# Patient Record
Sex: Male | Born: 1960 | Race: Black or African American | Hispanic: No | Marital: Single | Smoking: Former smoker
Health system: Southern US, Community
[De-identification: ages and names within clinical notes are randomized; demographics above are authoritative.]

## PROBLEM LIST (undated history)

## (undated) DIAGNOSIS — C801 Malignant (primary) neoplasm, unspecified: Secondary | ICD-10-CM

## (undated) HISTORY — PX: ABDOMINAL SURGERY: SHX537

---

## 2012-02-11 ENCOUNTER — Encounter (HOSPITAL_COMMUNITY): Payer: Self-pay | Admitting: Emergency Medicine

## 2012-02-11 ENCOUNTER — Emergency Department (HOSPITAL_COMMUNITY): Payer: Medicaid Other

## 2012-02-11 ENCOUNTER — Inpatient Hospital Stay (HOSPITAL_COMMUNITY)
Admission: EM | Admit: 2012-02-11 | Discharge: 2012-02-13 | DRG: 638 | Disposition: A | Discharge status: Home / Self Care | Payer: Medicaid Other | Source: Ambulatory Visit | Attending: Internal Medicine | Admitting: Internal Medicine

## 2012-02-11 DIAGNOSIS — Z833 Family history of diabetes mellitus: Secondary | ICD-10-CM

## 2012-02-11 DIAGNOSIS — R109 Unspecified abdominal pain: Secondary | ICD-10-CM | POA: Diagnosis present

## 2012-02-11 DIAGNOSIS — Z85028 Personal history of other malignant neoplasm of stomach: Secondary | ICD-10-CM

## 2012-02-11 DIAGNOSIS — R079 Chest pain, unspecified: Secondary | ICD-10-CM

## 2012-02-11 DIAGNOSIS — E860 Dehydration: Secondary | ICD-10-CM

## 2012-02-11 DIAGNOSIS — R1319 Other dysphagia: Secondary | ICD-10-CM | POA: Diagnosis present

## 2012-02-11 DIAGNOSIS — Z6841 Body Mass Index (BMI) 40.0 and over, adult: Secondary | ICD-10-CM

## 2012-02-11 DIAGNOSIS — Z9221 Personal history of antineoplastic chemotherapy: Secondary | ICD-10-CM

## 2012-02-11 DIAGNOSIS — E1110 Type 2 diabetes mellitus with ketoacidosis without coma: Secondary | ICD-10-CM

## 2012-02-11 DIAGNOSIS — R4586 Emotional lability: Secondary | ICD-10-CM | POA: Diagnosis present

## 2012-02-11 DIAGNOSIS — E1010 Type 1 diabetes mellitus with ketoacidosis without coma: Secondary | ICD-10-CM

## 2012-02-11 DIAGNOSIS — E119 Type 2 diabetes mellitus without complications: Secondary | ICD-10-CM

## 2012-02-11 DIAGNOSIS — K219 Gastro-esophageal reflux disease without esophagitis: Secondary | ICD-10-CM | POA: Diagnosis present

## 2012-02-11 DIAGNOSIS — E1310 Other specified diabetes mellitus with ketoacidosis without coma: Principal | ICD-10-CM | POA: Diagnosis present

## 2012-02-11 HISTORY — DX: Malignant (primary) neoplasm, unspecified: C80.1

## 2012-02-11 LAB — POCT I-STAT 3, ART BLOOD GAS (G3+)
O2 Saturation: 92 %
Patient temperature: 98
TCO2: 18 mmol/L (ref 0–100)

## 2012-02-11 LAB — COMPREHENSIVE METABOLIC PANEL
ALT: 28 U/L (ref 0–53)
Albumin: 3.7 g/dL (ref 3.5–5.2)
BUN: 17 mg/dL (ref 6–23)
Calcium: 9.9 mg/dL (ref 8.4–10.5)
GFR calc Af Amer: 77 mL/min — ABNORMAL LOW (ref >90)
Glucose, Bld: 473 mg/dL — ABNORMAL HIGH (ref 70–99)
Sodium: 131 mEq/L — ABNORMAL LOW (ref 135–145)
Total Protein: 8.5 g/dL — ABNORMAL HIGH (ref 6.0–8.3)

## 2012-02-11 LAB — URINALYSIS, ROUTINE W REFLEX MICROSCOPIC
Glucose, UA: >1000 mg/dL — AB
Ketones, ur: >80 mg/dL — AB
Leukocytes, UA: NEGATIVE
Nitrite: NEGATIVE
Specific Gravity, Urine: 1.042 — ABNORMAL HIGH (ref 1.005–1.030)
pH: 5.5 (ref 5.0–8.0)

## 2012-02-11 LAB — CBC WITH DIFFERENTIAL/PLATELET
Basophils Absolute: 0 10*3/uL (ref 0.0–0.1)
Basophils Relative: 0 % (ref 0–1)
HCT: 47.6 % (ref 39.0–52.0)
Lymphocytes Relative: 28 % (ref 12–46)
MCHC: 32.8 g/dL (ref 30.0–36.0)
Neutro Abs: 8 10*3/uL — ABNORMAL HIGH (ref 1.7–7.7)
Neutrophils Relative %: 65 % (ref 43–77)
Platelets: 229 10*3/uL (ref 150–400)
RDW: 13.8 % (ref 11.5–15.5)
WBC: 12.3 10*3/uL — ABNORMAL HIGH (ref 4.0–10.5)

## 2012-02-11 LAB — GLUCOSE, CAPILLARY: Glucose-Capillary: 402 mg/dL — ABNORMAL HIGH (ref 70–99)

## 2012-02-11 LAB — BASIC METABOLIC PANEL
CO2: 17 mEq/L — ABNORMAL LOW (ref 19–32)
Chloride: 87 mEq/L — ABNORMAL LOW (ref 96–112)
Creatinine, Ser: 1.27 mg/dL (ref 0.50–1.35)
GFR calc Af Amer: 75 mL/min — ABNORMAL LOW (ref >90)
Potassium: 5 mEq/L (ref 3.5–5.1)
Sodium: 132 mEq/L — ABNORMAL LOW (ref 135–145)

## 2012-02-11 LAB — CARDIAC PANEL(CRET KIN+CKTOT+MB+TROPI)
Relative Index: 1 (ref 0.0–2.5)
Total CK: 590 U/L — ABNORMAL HIGH (ref 7–232)
Troponin I: <0.3 ng/mL (ref <0.30)

## 2012-02-11 LAB — URINE MICROSCOPIC-ADD ON

## 2012-02-11 MED ORDER — ONDANSETRON HCL 4 MG/2ML IJ SOLN
INTRAMUSCULAR | Status: AC
  Filled 2012-02-11: qty 2

## 2012-02-11 MED ORDER — ENOXAPARIN SODIUM 40 MG/0.4ML ~~LOC~~ SOLN
40.0000 mg | SUBCUTANEOUS | Status: DC
  Filled 2012-02-11: qty 0.4

## 2012-02-11 MED ORDER — SODIUM CHLORIDE 0.9 % IV SOLN
INTRAVENOUS | Status: DC
  Administered 2012-02-12 (×2): via INTRAVENOUS
  Filled 2012-02-11 (×3): qty 1

## 2012-02-11 MED ORDER — SODIUM CHLORIDE 0.9 % IV SOLN
INTRAVENOUS | Status: DC
  Administered 2012-02-11: via INTRAVENOUS

## 2012-02-11 MED ORDER — SODIUM CHLORIDE 0.9 % IV SOLN
INTRAVENOUS | Status: DC
  Administered 2012-02-11: 3.4 [IU]/h via INTRAVENOUS
  Filled 2012-02-11: qty 1

## 2012-02-11 MED ORDER — ONDANSETRON HCL 4 MG/2ML IJ SOLN
4.0000 mg | Freq: Once | INTRAMUSCULAR | Status: AC
  Administered 2012-02-11: 4 mg via INTRAVENOUS

## 2012-02-11 MED ORDER — POTASSIUM CHLORIDE 10 MEQ/100ML IV SOLN
10.0000 meq | INTRAVENOUS | Status: AC
  Administered 2012-02-12 (×4): 10 meq via INTRAVENOUS
  Filled 2012-02-11 (×4): qty 100

## 2012-02-11 MED ORDER — SODIUM CHLORIDE 0.9 % IV BOLUS (SEPSIS)
1000.0000 mL | Freq: Once | INTRAVENOUS | Status: AC
  Administered 2012-02-11: 1000 mL via INTRAVENOUS

## 2012-02-11 MED ORDER — DEXTROSE-NACL 5-0.45 % IV SOLN
INTRAVENOUS | Status: DC
  Administered 2012-02-12: 1000 mL via INTRAVENOUS
  Administered 2012-02-12 (×2): via INTRAVENOUS

## 2012-02-11 MED ORDER — DEXTROSE 50 % IV SOLN: 25.0000 mL | INTRAVENOUS | Status: DC | PRN

## 2012-02-11 MED ORDER — HYDROMORPHONE HCL PF 1 MG/ML IJ SOLN
1.0000 mg | Freq: Once | INTRAMUSCULAR | Status: AC
  Administered 2012-02-11: 1 mg via INTRAVENOUS
  Filled 2012-02-11: qty 1

## 2012-02-11 MED ORDER — ENOXAPARIN SODIUM 40 MG/0.4ML ~~LOC~~ SOLN
40.0000 mg | Freq: Every day | SUBCUTANEOUS | Status: DC
  Administered 2012-02-12 – 2012-02-13 (×2): 40 mg via SUBCUTANEOUS
  Filled 2012-02-11 (×2): qty 0.4

## 2012-02-11 NOTE — ED Notes (Signed)
MD at bedside. 

## 2012-02-11 NOTE — ED Notes (Signed)
MD and RT at bedside. °

## 2012-02-11 NOTE — ED Notes (Signed)
Pt became nauseated and gagging after administration of pain medication, administered zofran per protocol and comfort measures.  Fluid infusing at this time. Will start iv insulin after fluid bolus completed

## 2012-02-11 NOTE — ED Notes (Addendum)
Pt has multiple complaints ongoing x 2 weeks. Pt c/o swelling in B/L legs, increase thirst and frequent urination. Pt has taken voltren, bactrim, zegrid, without relief. Pt also reports dizziness.

## 2012-02-12 DIAGNOSIS — E1110 Type 2 diabetes mellitus with ketoacidosis without coma: Secondary | ICD-10-CM

## 2012-02-12 DIAGNOSIS — R109 Unspecified abdominal pain: Secondary | ICD-10-CM

## 2012-02-12 DIAGNOSIS — R079 Chest pain, unspecified: Secondary | ICD-10-CM

## 2012-02-12 DIAGNOSIS — E860 Dehydration: Secondary | ICD-10-CM

## 2012-02-12 LAB — GLUCOSE, CAPILLARY
Glucose-Capillary: 166 mg/dL — ABNORMAL HIGH (ref 70–99)
Glucose-Capillary: 167 mg/dL — ABNORMAL HIGH (ref 70–99)
Glucose-Capillary: 168 mg/dL — ABNORMAL HIGH (ref 70–99)
Glucose-Capillary: 176 mg/dL — ABNORMAL HIGH (ref 70–99)
Glucose-Capillary: 183 mg/dL — ABNORMAL HIGH (ref 70–99)
Glucose-Capillary: 221 mg/dL — ABNORMAL HIGH (ref 70–99)
Glucose-Capillary: 245 mg/dL — ABNORMAL HIGH (ref 70–99)
Glucose-Capillary: 318 mg/dL — ABNORMAL HIGH (ref 70–99)
Glucose-Capillary: 367 mg/dL — ABNORMAL HIGH (ref 70–99)

## 2012-02-12 LAB — CBC
HCT: 43.9 % (ref 39.0–52.0)
HCT: 44.1 % (ref 39.0–52.0)
Hemoglobin: 14.2 g/dL (ref 13.0–17.0)
MCHC: 32.2 g/dL (ref 30.0–36.0)
MCV: 86.3 fL (ref 78.0–100.0)
RDW: 13.9 % (ref 11.5–15.5)
WBC: 11.2 10*3/uL — ABNORMAL HIGH (ref 4.0–10.5)

## 2012-02-12 LAB — BASIC METABOLIC PANEL
BUN: 15 mg/dL (ref 6–23)
BUN: 13 mg/dL (ref 6–23)
CO2: 20 mEq/L (ref 19–32)
CO2: 21 mEq/L (ref 19–32)
Calcium: 9.6 mg/dL (ref 8.4–10.5)
Chloride: 99 mEq/L (ref 96–112)
Chloride: 103 mEq/L (ref 96–112)
Chloride: 101 mEq/L (ref 96–112)
Creatinine, Ser: 1.12 mg/dL (ref 0.50–1.35)
Creatinine, Ser: 1.05 mg/dL (ref 0.50–1.35)
GFR calc Af Amer: 87 mL/min — ABNORMAL LOW (ref >90)
GFR calc Af Amer: 84 mL/min — ABNORMAL LOW (ref >90)
GFR calc non Af Amer: 75 mL/min — ABNORMAL LOW (ref >90)
Glucose, Bld: 167 mg/dL — ABNORMAL HIGH (ref 70–99)
Glucose, Bld: 250 mg/dL — ABNORMAL HIGH (ref 70–99)
Glucose, Bld: 202 mg/dL — ABNORMAL HIGH (ref 70–99)
Potassium: 4.5 mEq/L (ref 3.5–5.1)
Potassium: 4.2 mEq/L (ref 3.5–5.1)
Potassium: 4.5 mEq/L (ref 3.5–5.1)
Sodium: 134 mEq/L — ABNORMAL LOW (ref 135–145)
Sodium: 137 mEq/L (ref 135–145)

## 2012-02-12 LAB — CARDIAC PANEL(CRET KIN+CKTOT+MB+TROPI)
CK, MB: 5.7 ng/mL — ABNORMAL HIGH (ref 0.3–4.0)
CK, MB: 5.4 ng/mL — ABNORMAL HIGH (ref 0.3–4.0)
CK, MB: 5 ng/mL — ABNORMAL HIGH (ref 0.3–4.0)
Relative Index: 1 (ref 0.0–2.5)
Total CK: 525 U/L — ABNORMAL HIGH (ref 7–232)
Troponin I: <0.3 ng/mL (ref <0.30)
Troponin I: <0.3 ng/mL (ref <0.30)

## 2012-02-12 LAB — RAPID URINE DRUG SCREEN, HOSP PERFORMED
Opiates: NOT DETECTED
Tetrahydrocannabinol: NOT DETECTED

## 2012-02-12 MED ORDER — INSULIN GLARGINE 100 UNIT/ML ~~LOC~~ SOLN: 15.0000 [IU] | Freq: Every day | SUBCUTANEOUS | Status: DC

## 2012-02-12 MED ORDER — CHLORHEXIDINE GLUCONATE 0.12 % MT SOLN
15.0000 mL | Freq: Four times a day (QID) | OROMUCOSAL | Status: DC
  Administered 2012-02-12 – 2012-02-13 (×5): 15 mL via OROMUCOSAL
  Filled 2012-02-12 (×6): qty 15

## 2012-02-12 MED ORDER — INSULIN GLARGINE 100 UNIT/ML ~~LOC~~ SOLN
40.0000 [IU] | Freq: Every day | SUBCUTANEOUS | Status: DC
  Administered 2012-02-12: 40 [IU] via SUBCUTANEOUS

## 2012-02-12 MED ORDER — INSULIN GLARGINE 100 UNIT/ML ~~LOC~~ SOLN
15.0000 [IU] | Freq: Once | SUBCUTANEOUS | Status: AC
  Administered 2012-02-12: 15 [IU] via SUBCUTANEOUS

## 2012-02-12 MED ORDER — FLUCONAZOLE 100MG IVPB
100.0000 mg | INTRAVENOUS | Status: DC
  Administered 2012-02-12: 100 mg via INTRAVENOUS
  Filled 2012-02-12 (×2): qty 50

## 2012-02-12 MED ORDER — ACETAMINOPHEN 325 MG PO TABS
650.0000 mg | ORAL_TABLET | Freq: Four times a day (QID) | ORAL | Status: DC | PRN
  Administered 2012-02-12 – 2012-02-13 (×4): 650 mg via ORAL
  Filled 2012-02-12 (×4): qty 2

## 2012-02-12 NOTE — H&P (Signed)
Craig [name] is an 51 y.o. male.   PCP - None Oncologist - Dr.Johnson in [place]. Chief Complaint: Increased urinary frequency. Chest pain. HPI: 51 year old male history of stomach cancer being treated by oncologist at [facility] and is on chemotherapy every month presents with complaints of any increased frequency of urination and increased thirst over the last 2 weeks. At the same time patient has been having chest pressure and abdominal pain. The pain in the chest is nonexertional present even at rest. Has no associated shortness of breath palpitations or diaphoresis. In the ER patient was found to have high blood sugars with anion gap. Patient has been started on IV insulin per DKA protocol and has been admitted for further management. Patient presently chest pain and abdominal pain free. He did throw up once in the ER after he got pain relief medication and after which his symptoms have improved. Over the last few days he has not eaten well and the last time he moved his bowels was 3-4 days ago. His cardiac enzymes and EKG does not show anything acute.   patient states that his oncologist is from [place]. He comes every month to [facility] for treating the patient at [facility]. Patient has had surgeries previously for the stomach cancer the last one was in 2001. But he gets chemotherapy since then. And he gets every month.   Past Medical History  Diagnosis Date  . Cancer abdomen    Past Surgical History  Procedure Date  . Abdominal surgery     Family History  Problem Relation Age of Onset  . Diabetes type II Mother    Social History:  reports that he has never smoked. He does not have any smokeless tobacco history on file. He reports that he does not drink alcohol or use illicit drugs.  Allergies: No Known Allergies  Medications Prior to Admission  Medication Sig Dispense Refill  . Omeprazole-Sodium Bicarbonate (ZEGERID) 20-1100 MG CAPS Take 1 capsule by mouth daily before breakfast.         Results for orders placed during the hospital encounter of 02/11/12 (from the past 48 hour(s))  CBC WITH DIFFERENTIAL     Status: Abnormal   Collection Time   02/11/12  6:01 PM      Component Value Range Comment   WBC 12.3 (*) 4.0 - 10.5 K/uL    RBC 5.50  4.22 - 5.81 MIL/uL    Hemoglobin 15.6  13.0 - 17.0 g/dL    HCT 16.1  09.6 - 04.5 %    MCV 86.5  78.0 - 100.0 fL    MCH 28.4  26.0 - 34.0 pg    MCHC 32.8  30.0 - 36.0 g/dL    RDW 40.9  81.1 - 91.4 %    Platelets 229  150 - 400 K/uL    Neutrophils Relative 65  43 - 77 %    Neutro Abs 8.0 (*) 1.7 - 7.7 K/uL    Lymphocytes Relative 28  12 - 46 %    Lymphs Abs 3.4  0.7 - 4.0 K/uL    Monocytes Relative 6  3 - 12 %    Monocytes Absolute 0.8  0.1 - 1.0 K/uL    Eosinophils Relative 0  0 - 5 %    Eosinophils Absolute 0.0  0.0 - 0.7 K/uL    Basophils Relative 0  0 - 1 %    Basophils Absolute 0.0  0.0 - 0.1 K/uL   BASIC METABOLIC PANEL  Status: Abnormal   Collection Time   02/11/12  6:01 PM      Component Value Range Comment   Sodium 132 (*) 135 - 145 mEq/L    Potassium 5.0  3.5 - 5.1 mEq/L    Chloride 87 (*) 96 - 112 mEq/L    CO2 17 (*) 19 - 32 mEq/L    Glucose, Bld 502 (*) 70 - 99 mg/dL    BUN 16  6 - 23 mg/dL    Creatinine, Ser 0.10  0.50 - 1.35 mg/dL    Calcium 27.2  8.4 - 10.5 mg/dL    GFR calc non Af Amer 65 (*) >90 mL/min    GFR calc Af Amer 75 (*) >90 mL/min   URINALYSIS, ROUTINE W REFLEX MICROSCOPIC     Status: Abnormal   Collection Time   02/11/12  8:24 PM      Component Value Range Comment   Color, Urine YELLOW  YELLOW    APPearance CLEAR  CLEAR    Specific Gravity, Urine 1.042 (*) 1.005 - 1.030    pH 5.5  5.0 - 8.0    Glucose, UA >1000 (*) NEGATIVE mg/dL    Hgb urine dipstick SMALL (*) NEGATIVE    Bilirubin Urine LARGE (*) NEGATIVE    Ketones, ur >80 (*) NEGATIVE mg/dL    Protein, ur 536 (*) NEGATIVE mg/dL    Urobilinogen, UA 1.0  0.0 - 1.0 mg/dL    Nitrite NEGATIVE  NEGATIVE    Leukocytes, UA NEGATIVE   NEGATIVE   URINE MICROSCOPIC-ADD ON     Status: Abnormal   Collection Time   02/11/12  8:24 PM      Component Value Range Comment   RBC / HPF 0-2  <3 RBC/hpf    Casts GRANULAR CAST (*) NEGATIVE   COMPREHENSIVE METABOLIC PANEL     Status: Abnormal   Collection Time   02/11/12  9:58 PM      Component Value Range Comment   Sodium 131 (*) 135 - 145 mEq/L    Potassium 4.8  3.5 - 5.1 mEq/L    Chloride 89 (*) 96 - 112 mEq/L    CO2 12 (*) 19 - 32 mEq/L    Glucose, Bld 473 (*) 70 - 99 mg/dL    BUN 17  6 - 23 mg/dL    Creatinine, Ser 6.44  0.50 - 1.35 mg/dL    Calcium 9.9  8.4 - 03.4 mg/dL    Total Protein 8.5 (*) 6.0 - 8.3 g/dL    Albumin 3.7  3.5 - 5.2 g/dL    AST 22  0 - 37 U/L    ALT 28  0 - 53 U/L    Alkaline Phosphatase 110  39 - 117 U/L    Total Bilirubin 0.4  0.3 - 1.2 mg/dL    GFR calc non Af Amer 66 (*) >90 mL/min    GFR calc Af Amer 77 (*) >90 mL/min   CARDIAC PANEL(CRET KIN+CKTOT+MB+TROPI)     Status: Abnormal   Collection Time   02/11/12  9:58 PM      Component Value Range Comment   Total CK 590 (*) 7 - 232 U/L    CK, MB 5.9 (*) 0.3 - 4.0 ng/mL    Troponin I <0.30  <0.30 ng/mL    Relative Index 1.0  0.0 - 2.5   LIPASE, BLOOD     Status: Normal   Collection Time   02/11/12  9:58 PM  Component Value Range Comment   Lipase 45  11 - 59 U/L   GLUCOSE, CAPILLARY     Status: Abnormal   Collection Time   02/11/12 10:24 PM      Component Value Range Comment   Glucose-Capillary 402 (*) 70 - 99 mg/dL   POCT I-STAT 3, BLOOD GAS (G3+)     Status: Abnormal   Collection Time   02/11/12 10:59 PM      Component Value Range Comment   pH, Arterial 7.255 (*) 7.350 - 7.450    pCO2 arterial 37.5  35.0 - 45.0 mmHg    pO2, Arterial 73.0 (*) 80.0 - 100.0 mmHg    Bicarbonate 16.7 (*) 20.0 - 24.0 mEq/L    TCO2 18  0 - 100 mmol/L    O2 Saturation 92.0      Acid-base deficit 10.0 (*) 0.0 - 2.0 mmol/L    Patient temperature 98.0 F      Collection site RADIAL, ALLEN'S TEST ACCEPTABLE       Drawn by RT      Sample type ARTERIAL     GLUCOSE, CAPILLARY     Status: Abnormal   Collection Time   02/11/12 11:18 PM      Component Value Range Comment   Glucose-Capillary 387 (*) 70 - 99 mg/dL    Comment 1 Notify RN      Dg Chest 2 View  02/11/2012  *RADIOLOGY REPORT*  Clinical Data: Leg swelling, nausea.  CHEST - 2 VIEW  Comparison: None  Findings: Heart is borderline in size.  There are low lung volumes with vascular congestion and bibasilar atelectasis.  No overt edema.  No effusions or acute bony abnormality.  IMPRESSION: Cardiomegaly, vascular congestion.  Low lung volumes with bibasilar atelectasis.  Original Report Authenticated By: Cyndie Chime, M.D.    Review of Systems  HENT: Negative.   Eyes: Negative.   Respiratory: Negative.   Cardiovascular: Positive for chest pain.  Gastrointestinal: Positive for abdominal pain.  Genitourinary: Positive for frequency.  Musculoskeletal: Negative.   Skin: Negative.   Neurological: Positive for weakness.  Endo/Heme/Allergies: Negative.   Psychiatric/Behavioral: Negative.     Blood pressure 136/84, pulse 91, temperature 97.6 F (36.4 C), temperature source Oral, resp. rate 17, height 5' 1.32" (1.558 m), weight 174.045 kg (383 lb 11.2 oz), SpO2 96.00%. Physical Exam  Constitutional: He is oriented to person, place, and time. He appears well-developed and well-nourished. No distress.  HENT:  Head: Normocephalic and atraumatic.  Right Ear: External ear normal.  Left Ear: External ear normal.  Nose: Nose normal.  Mouth/Throat: Oropharynx is clear and moist. No oropharyngeal exudate.  Eyes: Conjunctivae are normal. Pupils are equal, round, and reactive to light. Right eye exhibits no discharge. Left eye exhibits no discharge. No scleral icterus.  Neck: Normal range of motion. Neck supple.  Cardiovascular: Normal rate and regular rhythm.   Respiratory: Effort normal and breath sounds normal. No respiratory distress. He has no  wheezes. He has no rales.  GI: Soft. Bowel sounds are normal. He exhibits no distension. There is no tenderness. There is no rebound.  Musculoskeletal: Normal range of motion. He exhibits no edema and no tenderness.  Neurological: He is alert and oriented to person, place, and time.       Moves all extremities.  Skin: Skin is warm and dry. He is not diaphoretic.  Psychiatric: His behavior is normal.     Assessment/Plan #1. 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 Transition to subcutaneous insulin once anion gap corrected. #2. Chest pain - presently chest pain-free. His chest x-ray does show cardiomegaly. We will cycle cardiac markers. Get 2-D echo. #3. Abdominal pain - this has improved with pain relief medications. His abdominal exam is benign when I was examining but patient did receive pain relief medications. Given his history of stomach cancer if the pain recurs may need CT abdomen and pelvis.  #4. Morbid obesity.  CODE STATUS - full code.  Taheem Fricke N. 02/12/2012, 12:48 AM

## 2012-02-12 NOTE — Progress Notes (Signed)
INITIAL ADULT NUTRITION ASSESSMENT Date: 02/12/2012   Time: 10:42 AM Reason for Assessment: Nutrition Risk Report  INTERVENTION: 1. Education provided 2. Recommend increasing diet to Carb Mod Medium, this diet will still encourage weight loss and will more accurately refect DM diet teaching   ASSESSMENT: Male 51 y.o.  Dx: Diabetic ketoacidosis  Hx:  Past Medical History  Diagnosis Date  . Cancer abdomen   Past Surgical History  Procedure Date  . Abdominal surgery     Related Meds:     . enoxaparin  40 mg Subcutaneous Daily  .  HYDROmorphone (DILAUDID) injection  1 mg Intravenous Once  . insulin glargine  15 Units Subcutaneous QHS  . insulin glargine  15 Units Subcutaneous Once  . ondansetron (ZOFRAN) IV  4 mg Intravenous Once  . potassium chloride  10 mEq Intravenous Q1H  . sodium chloride  1,000 mL Intravenous Once  . sodium chloride  1,000 mL Intravenous Once  . DISCONTD: enoxaparin  40 mg Subcutaneous Q24H  . DISCONTD: insulin glargine  15 Units Subcutaneous QHS     Ht: 5' 1.32" (155.8 cm) Pt reports height of 5\' 11"  (180 cm)  Wt: 383 lb 11.2 oz (174.045 kg)  Ideal Wt: 78.2 kg  % Ideal Wt: 222%  Usual Wt: 415 lbs per pt report % Usual Wt: 92%  BMI= 53.7 kg (m^2) Pt is morbidly obese   Food/Nutrition Related Hx: Pt reports weight loss  Labs:  CMP     Component Value Date/Time   NA 139 02/12/2012 0900   K 4.5 02/12/2012 0900   CL 103 02/12/2012 0900   CO2 15* 02/12/2012 0900   GLUCOSE 167* 02/12/2012 0900   BUN 15 02/12/2012 0900   CREATININE 1.11 02/12/2012 0900   CALCIUM 9.7 02/12/2012 0900   PROT 8.5* 02/11/2012 2158   ALBUMIN 3.7 02/11/2012 2158   AST 22 02/11/2012 2158   ALT 28 02/11/2012 2158   ALKPHOS 110 02/11/2012 2158   BILITOT 0.4 02/11/2012 2158   GFRNONAA 76* 02/12/2012 0900   GFRAA 88* 02/12/2012 0900     Intake/Output Summary (Last 24 hours) at 02/12/12 1044 Last data filed at 02/12/12 0900  Gross per 24 hour  Intake   1240 ml  Output    550 ml    Net    690 ml      Diet Order: Carb Control - low  Supplements/Tube Feeding: none  IVF:    sodium chloride Last Rate: 150 mL/hr at 02/11/12 2330  dextrose 5 % and 0.45% NaCl Last Rate: 125 mL/hr at 02/12/12 0409  insulin (NOVOLIN-R) infusion Last Rate: 11.3 Units/hr (02/12/12 1008)  DISCONTD: insulin (NOVOLIN-R) infusion Last Rate: 3.4 Units/hr (02/11/12 2228)    Estimated Nutritional Needs:   Kcal: 2200-2400 Protein: 90-100 gm  Fluid:  2.2 - 2.4 L   Pt with new onset DM. Also has hx of stomach cancer, treatments monthly in [place].  Pt reports he was starting to try and lose weight after being encouraged by his brother. He states he started taking a pain medication and an abx (unclear reason) and that is when he started to lose weight. Reports 35 lb weight loss in 2 weeks, increased urination and difficulty eating large portions. No documented weights to compare to. Some weight loss recommended for this pt given morbid obesity, weight loss PTA may have been intentional or r/t DKA.    RD provided DM diet education:  No results found for this basename: HGBA1C    RD  provided "Carbohydrate Counting for Diabetes" handout from the Academy of [employer] and Dietetics. Discussed different food groups and their effects on blood sugar, emphasizing carbohydrate-containing foods. Provided list of carbohydrates and recommended serving sizes of common foods.  Discussed importance of controlled and consistent carbohydrate intake throughout the day. Provided examples of ways to balance meals/snacks and encouraged intake of high-fiber, whole grain complex carbohydrates.  Expect fair compliance.   NUTRITION DIAGNOSIS: -Food and nutrition related knowledge deficit (NB-1.1).  Status: resolved  RELATED TO: new onset DM   AS EVIDENCE BY: DKA at admission   MONITORING/EVALUATION(Goals): Goal: understanding of DM diet: carbohydrate counting, serving sizes, and carbohydrate containing foods.   Monitor: Education needs, PO intake, weight   EDUCATION NEEDS: -Education needs addressed    DOCUMENTATION CODES Per approved criteria  -Morbid Obesity    Clarene Duke RD, LDN Pager [phone] After Hours pager [phone]

## 2012-02-12 NOTE — Progress Notes (Addendum)
Patient ID: Craig [name], male   DOB: Sep 17, 1960, 51 y.o.   MRN: [id number] PATIENT DETAILS Name: Craig [name] Age: 51 y.o. Sex: male Date of Birth: February 20, 1961 Admit Date: 02/11/2012 [id number], MD    Interim History: Patient with history of stomach cancer, on monthly chemotherapy.  Oncologist in [place] (Dr.Johnson).  Patient unable to tell me more specific information.    Subjective: C/O 35 lb weight loss in two weeks.  Cant eat do to difficulty swallowing,  and then vomiting just before admission.  Reports difficulty swallowing as a child but can not give me specifics about dysphagia.  Objective: Weight change:   Intake/Output Summary (Last 24 hours) at 02/12/12 1304 Last data filed at 02/12/12 0900  Gross per 24 hour  Intake   1240 ml  Output    550 ml  Net    690 ml   Blood pressure 106/73, pulse 97, temperature 97.6 F (36.4 C), temperature source Oral, resp. rate 18, height 5' 1.32" (1.558 m), weight 174.045 kg (383 lb 11.2 oz), SpO2 96.00%. Filed Vitals:   02/12/12 0240 02/12/12 0300 02/12/12 0517 02/12/12 1016  BP:  123/82 123/79 106/73  Pulse: 96  91 97  Temp: 98.1 F (36.7 C)  98.1 F (36.7 C) 97.6 F (36.4 C)  TempSrc: Oral  Oral Oral  Resp:  17 17 18   Height:      Weight:      SpO2:  95% 95% 96%    Physical Exam: General: A&O, NAD Lungs: Clear to auscultation bilaterally without wheezes or crackles Cardiovascular: Regular rate and rhythm without murmur gallop or rub normal S1 and S2 Abdomen: Massive Nontender, nondistended, soft, bowel sounds positive, no rebound, no ascites, no appreciable mass Extremities: No significant cyanosis, clubbing.  Darkened skin over lower extremities, but no obvious cellulitis or abnormality.  Swelling 1+ (appears chronic) in lower ext bilaterally. Psych:  A&O, Pleasant, patient unable to give much specific history.  Seems as though he may be slightly impaired.  Very young emotionally for his age.  Basic  Metabolic Panel:  Lab 02/12/12 6295 02/12/12 0625  NA 139 137  K 4.5 4.2  CL 103 99  CO2 15* 18*  GLUCOSE 167* 197*  BUN 15 15  CREATININE 1.11 1.16  CALCIUM 9.7 9.6  MG -- --  PHOS -- --   Liver Function Tests:  Lab 02/11/12 2158  AST 22  ALT 28  ALKPHOS 110  BILITOT 0.4  PROT 8.5*  ALBUMIN 3.7    Lab 02/11/12 2158  LIPASE 45  AMYLASE --   CBC:  Lab 02/12/12 0625 02/11/12 2359 02/11/12 1801  WBC 10.6* 11.2* --  NEUTROABS -- -- 8.0*  HGB 14.2 14.2 --  HCT 44.1 43.9 --  MCV 86.3 86.1 --  PLT 206 230 --   Cardiac Enzymes:  Lab 02/12/12 0900 02/11/12 2359 02/11/12 2158  CKTOTAL 549* 556* 590*  CKMB 5.4* 5.7* 5.9*  CKMBINDEX -- -- --  TROPONINI <0.30 <0.30 <0.30   CBG:  Lab 02/12/12 1235 02/12/12 1115 02/12/12 1000 02/12/12 0854 02/12/12 0812 02/12/12 0730  GLUCAP 168* 203* 186* 176* 177* 245*    Drugs of Abuse     Component Value Date/Time   LABOPIA NONE DETECTED 02/12/2012 0452   COCAINSCRNUR NONE DETECTED 02/12/2012 0452   LABBENZ NONE DETECTED 02/12/2012 0452   AMPHETMU NONE DETECTED 02/12/2012 0452   THCU NONE DETECTED 02/12/2012 0452   LABBARB NONE DETECTED 02/12/2012 0452     Studies/Results: Scheduled Meds:   .  enoxaparin  40 mg Subcutaneous Daily  .  HYDROmorphone (DILAUDID) injection  1 mg Intravenous Once  . insulin glargine  15 Units Subcutaneous Once  . insulin glargine  40 Units Subcutaneous QHS  . ondansetron (ZOFRAN) IV  4 mg Intravenous Once  . potassium chloride  10 mEq Intravenous Q1H  . sodium chloride  1,000 mL Intravenous Once  . sodium chloride  1,000 mL Intravenous Once  . DISCONTD: enoxaparin  40 mg Subcutaneous Q24H  . DISCONTD: insulin glargine  15 Units Subcutaneous QHS  . DISCONTD: insulin glargine  15 Units Subcutaneous QHS   Continuous Infusions:   . sodium chloride 150 mL/hr at 02/11/12 2330  . dextrose 5 % and 0.45% NaCl 1,000 mL (02/12/12 1202)  . insulin (NOVOLIN-R) infusion 10.8 Units/hr (02/12/12 1238)  .  DISCONTD: insulin (NOVOLIN-R) infusion 3.4 Units/hr (02/11/12 2228)   PRN Meds:.acetaminophen, dextrose  Anti-infectives:  Anti-infectives    None      Assessment/Plan: Principal Problem:  *Diabetic ketoacidosis Active Problems:  Dehydration  Chest pain  Abdominal pain  1.  DKA.  Patient still with anion gap.  New diabetic.  Question whether his chemotherapy could have been contributing to his DM.  Currently remains on the insulin drip until his bi-carb is 20, bmets are q 4 hours.  Following DKA protocol.  Nutrition consult and diabetic coordinator consult requested.  Patient has no insurance will need to be on 70/30 at discharge.  2.  Stomach Cancer.  Need records.  Trying to obtain name of Oncologist in [place].  Need to determine the type of cancer and what chemotherapy he is on.  3.  Swallowing difficulty.  (Dysphagia?)  Does not describe odynaphagia, denies that food gets stuck, but describes difficulty swallowing and weight loss.  Have requested speech therapy evaluation to help sort this out.  Added IV diflucan empirically for likely candida.  4.  Capacity. Patient is somewhat child like. Does not seem to be able to fully care for himself.  He lives alone.  He broke down into tears at the idea of taking insulin at home.  I question if he will need home health services (at a minimum) or perhaps some type of ALF placement to assist him in caring for himself and taking his medications.  DVT Prophylaxis   LOS: 1 day   Stephani Police 02/12/2012, 1:04 PM [phone]

## 2012-02-12 NOTE — Progress Notes (Signed)
  Echocardiogram 2D Echocardiogram has been performed.  Georgian Co 02/12/2012, 2:24 PM

## 2012-02-12 NOTE — Progress Notes (Signed)
Inpatient Diabetes Program Recommendations  AACE/ADA: New Consensus Statement on Inpatient Glycemic Control (2009)  Target Ranges:  Prepandial:   less than 140 mg/dL      Peak postprandial:   less than 180 mg/dL (1-2 hours)      Critically ill patients:  140 - 180 mg/dL   Reason for Visit:  New onset diabetes.  Consult   Inpatient Diabetes Program Recommendations Insulin - IV drip/GlucoStabilizer: Currently on GlucoStabilizer.  Current drip rate high at 17.6 units/hr.  Last lab draw at 2:30 pm indicated a GAP of 16.  CO2 was 20.  Was not covered for CHO intake at breakfast or lunch, which probably contributed to high drip rate. Insulin - Basal: Has Lantus 40 units ordered for HS.  Not sure patient is ready for transition off drip given elevated GAP. HgbA1C: Request MD order for Hgb A1C Outpatient Referral: To be followed at [facility].  Also has appt with Dr. Everardo All.  Financial Counselor to see.  Question of he could get assistance/coverage for OP education at the [facility].  Diet: Has had swallowing evaluation with dysphagia modifications added to diet order.  Note: Patient difficult to assess.  States that I probably needed to talk to his friend, but she had gone downstairs to a class.  Says his friend, Janie Morning, is trying to work on getting help for him.  Currently has three visitors in room.  Patient states that he has been feeling poorly for the last 2 weeks.  Mentioned taking some type of medicine, but unable to explain type or name.  States that he has been dealing with stomach cancer for the last 9 years.  Confirmed that he goes to [facility] treatment.    States he has been watching the diabetes videos on [employer].  (Later nurse told me that she doubts he has watched many of them.)  Told me that he is learning how to give insulin.  (Nurse told me that he told her that he didn't think he could do it.)  Plan to return to  see patient tomorrow morning and hope to catch his friend in the room.  States that the Case Manager has been making appts for him for after discharge.  Note that patient has received some Lantus this morning and has more ordered at HS.  See above regarding anion gap.  Patient has no insurance.  May benefit from either Novolin/ReliOn NPH (if type 2) or Novolin/ReliOn 70/30 (if either type 1 or type 2) from [employer] at $24.88/vial instead of a Lantus and Novolog regimen.  Once patient ready to transition off drip, if patient to use 70/30, at 0.4 u/kg, his dosage could be approximately 35 units bid at breakfast and supper with upward titration as indicated.  Will follow-up tomorrow.  Fayola Meckes S. Elsie Lincoln, RN, CNS, CDE  [phone])

## 2012-02-12 NOTE — ED Provider Notes (Signed)
History     CSN: [id number]  Arrival date & time 02/11/12  1733   First MD Initiated Contact with Patient 02/11/12 2018      Chief Complaint  Patient presents with  . Leg Swelling  . Anorexia  . Nausea  . Polydipsia  . 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 The patient complains of polydipsia and polyuria times several days. He also has nausea without vomiting. He complains of upper abdominal pain in epigastric region. There is no radiation to his back. He rates the pain as a 9/10 at this time. Denies constipation, diarrhea. Denies Cp/SOB. Denies fevers, chills. Denies history of diabetes in the past although he does have a family history of same. Reports b/l LE edema.   Past Medical History  Diagnosis Date  . Cancer abdomen    Past Surgical History  Procedure Date  . Abdominal surgery     Family History  Problem Relation Age of Onset  . Diabetes type II Mother     History  Substance Use Topics  . Smoking status: Never Smoker   . Smokeless tobacco: Not on file  . Alcohol Use: No      Review of Systems  All other systems reviewed and are negative.   except as noted HPI   Allergies  Review of patient's allergies indicates no known allergies.  Home Medications  No current outpatient prescriptions on file.  BP 136/84  Pulse 91  Temp 97.6 F (36.4 C) (Oral)  Resp 17  Ht 5' 1.32" (1.558 m)  Wt 383 lb 11.2 oz (174.045 kg)  BMI 71.74 kg/m2  SpO2 96%  Physical Exam  Nursing note and vitals reviewed. Constitutional: He is oriented to person, place, and time. He appears well-developed and well-nourished. No distress.  HENT:  Head: Atraumatic.       Mm dry  Eyes: Conjunctivae are normal. Pupils are equal, round, and reactive to light.  Neck: Neck supple.    Cardiovascular: Normal rate, regular rhythm, normal heart sounds and intact distal pulses.  Exam reveals no gallop and no friction rub.   No murmur heard. Pulmonary/Chest: Effort normal. No respiratory distress. He has no wheezes. He has no rales.  Abdominal: Soft. Bowel sounds are normal. There is tenderness. There is no rebound and no guarding.       Obese Epigastric ttp  Musculoskeletal: Normal range of motion. He exhibits no edema and no tenderness.  Neurological: He is alert and oriented to person, place, and time.  Skin: Skin is warm and dry.  Psychiatric: He has a normal mood and affect.    Date: 02/12/2012  Rate: 106  Rhythm: sinus tachycardia  QRS Axis: right  Intervals: normal  ST/T Wave abnormalities: normal  Conduction Disutrbances:none  Narrative Interpretation:   Old EKG Reviewed: none available  ED Course  Procedures (including critical care time)  Labs Reviewed  CBC WITH DIFFERENTIAL - Abnormal; Notable for the following:    WBC 12.3 (*)     Neutro Abs 8.0 (*)     All other components within normal limits  BASIC METABOLIC PANEL - Abnormal; Notable for the following:    Sodium 132 (*)     Chloride 87 (*)     CO2 17 (*)     Glucose, Bld 502 (*)     GFR calc non Af Amer 65 (*)  GFR calc Af Amer 75 (*)     All other components within normal limits  URINALYSIS, ROUTINE W REFLEX MICROSCOPIC - Abnormal; Notable for the following:    Specific Gravity, Urine 1.042 (*)     Glucose, UA >1000 (*)     Hgb urine dipstick SMALL (*)     Bilirubin Urine LARGE (*)     Ketones, ur >80 (*)     Protein, ur 100 (*)     All other components within normal limits  URINE MICROSCOPIC-ADD ON - Abnormal; Notable for the following:    Casts GRANULAR CAST (*)     All other components within normal limits  COMPREHENSIVE METABOLIC PANEL - Abnormal; Notable for the following:    Sodium 131 (*)     Chloride 89 (*)     CO2 12 (*)     Glucose, Bld 473 (*)     Total Protein 8.5  (*)     GFR calc non Af Amer 66 (*)     GFR calc Af Amer 77 (*)     All other components within normal limits  CARDIAC PANEL(CRET KIN+CKTOT+MB+TROPI) - Abnormal; Notable for the following:    Total CK 590 (*)     CK, MB 5.9 (*)     All other components within normal limits  GLUCOSE, CAPILLARY - Abnormal; Notable for the following:    Glucose-Capillary 402 (*)     All other components within normal limits  POCT I-STAT 3, BLOOD GAS (G3+) - Abnormal; Notable for the following:    pH, Arterial 7.255 (*)     pO2, Arterial 73.0 (*)     Bicarbonate 16.7 (*)     Acid-base deficit 10.0 (*)     All other components within normal limits  GLUCOSE, CAPILLARY - Abnormal; Notable for the following:    Glucose-Capillary 387 (*)     All other components within normal limits  CBC - Abnormal; Notable for the following:    WBC 11.2 (*)     All other components within normal limits  GLUCOSE, CAPILLARY - Abnormal; Notable for the following:    Glucose-Capillary 367 (*)     All other components within normal limits  LIPASE, BLOOD  CBC  CARDIAC PANEL(CRET KIN+CKTOT+MB+TROPI)  CARDIAC PANEL(CRET KIN+CKTOT+MB+TROPI)  BASIC METABOLIC PANEL  BASIC METABOLIC PANEL  BASIC METABOLIC PANEL  BASIC METABOLIC PANEL  CARDIAC PANEL(CRET KIN+CKTOT+MB+TROPI)   Dg Chest 2 View  02/11/2012  *RADIOLOGY REPORT*  Clinical Data: Leg swelling, nausea.  CHEST - 2 VIEW  Comparison: None  Findings: Heart is borderline in size.  There are low lung volumes with vascular congestion and bibasilar atelectasis.  No overt edema.  No effusions or acute bony abnormality.  IMPRESSION: Cardiomegaly, vascular congestion.  Low lung volumes with bibasilar atelectasis.  Original Report Authenticated By: Cyndie Chime, M.D.    1. Diabetic ketoacidosis   2. Dehydration     MDM  Patient presents with multiple complaints. He is found to have new-onset diabetes with diabetic ketoacidosis. Glucose stabilizer started per protocol. He does  have epigastric, chest pain. Abd ttp on exam. His cardiac enzymes are negative x1. EKG is non-diagnostic. Troponin negative. Doubt PE. Pain control. Resident discussed admission with [facility] hospitalist.        Forbes Cellar, MD 02/12/12 [phone]

## 2012-02-12 NOTE — Plan of Care (Signed)
Problem: Phase I Progression Outcomes Goal: NPO or per MD order Outcome: Completed/Met Date Met:  02/12/12 Carb. mod Goal: Initial discharge plan identified Outcome: Completed/Met Date Met:  02/12/12 To return home

## 2012-02-12 NOTE — Progress Notes (Signed)
-  Continue IV insulin until bicarbonate greater than 20 in and get close. We'll go ahead and start Lantus now. -Continue check B-met's every 4 hours and CBGs every 2.

## 2012-02-12 NOTE — Evaluation (Signed)
Clinical/Bedside Swallow Evaluation Patient Details  Name: Craig [name] MRN: [id number] Date of Birth: Dec 28, 1960  Today's Date: 02/12/2012 Time: 1435-1500 SLP Time Calculation (min): 25 min  Past Medical History:  Past Medical History  Diagnosis Date  . Cancer abdomen   Past Surgical History:  Past Surgical History  Procedure Date  . Abdominal surgery    HPI:  51 year old male history of stomach cancer being treated by oncologist at [facility] and is on chemotherapy every month presents with complaints of any increased frequency of urination and increased thirst over the last 2 weeks. At the same time patient has been having chest pressure and abdominal pain. The pain in the chest is nonexertional present even at rest. Has no associated shortness of breath palpitations or diaphoresis. In the ER patient was found to have high blood sugars with anion gap. Patient has been started on IV insulin per DKA protocol and has been admitted for further management. Patient presently chest pain and abdominal pain free. He did throw up once in the ER after he got pain relief medication and after which his symptoms have improved. Over the last few days he has not eaten well and the last time he moved his bowels was 3-4 days ago. Pt complains of difficulty swallowing and a terrible taste in his mouth.    Assessment / Plan / Recommendation Clinical Impression  Pt presents with a primary oral dysphagia due to dry mouth and disgust with dry solid foods with a reluctance to posteriorally propel bolus. Pt complains of a metallic taste in his mouth, espcially with water. This is often associated with diabetic ketoacidosis and with chemotherapy which the pt has each month. Will change pts diet to a dysphagia 3 mechanical soft diet with extra gravy/sauce and discussed initiation of CHG oral rinse with PA. Hopefully with medical management of DKA pt will see some improvement in symptoms. SLP will f/u x1 for  tolerance.     Aspiration Risk  None    Diet Recommendation Dysphagia 3 (Mechanical Soft);Thin liquid   Liquid Administration via: Cup;Straw Medication Administration: Whole meds with liquid Supervision: Patient able to self feed    Other  Recommendations Oral Care Recommendations: Oral care QID;Staff/trained caregiver to provide oral care   Follow Up Recommendations       Frequency and Duration min 1 x/week  1 week   Pertinent Vitals/Pain NA    SLP Swallow Goals Patient will consume recommended diet without observed clinical signs of aspiration with: Independent assistance Patient will utilize recommended strategies during swallow to increase swallowing safety with: Independent assistance   Swallow Study Prior Functional Status       General HPI: 51 year old male history of stomach cancer being treated by oncologist at [facility] and is on chemotherapy every month presents with complaints of any increased frequency of urination and increased thirst over the last 2 weeks. At the same time patient has been having chest pressure and abdominal pain. The pain in the chest is nonexertional present even at rest. Has no associated shortness of breath palpitations or diaphoresis. In the ER patient was found to have high blood sugars with anion gap. Patient has been started on IV insulin per DKA protocol and has been admitted for further management. Patient presently chest pain and abdominal pain free. He did throw up once in the ER after he got pain relief medication and after which his symptoms have improved. Over the last few days he has not eaten well and  the last time he moved his bowels was 3-4 days ago. Pt complains of difficulty swallowing and a terrible taste in his mouth.  Type of Study: Bedside swallow evaluation Diet Prior to this Study: Regular;Thin liquids Respiratory Status: Room air Behavior/Cognition: Alert;Cooperative;Pleasant mood Oral Cavity - Dentition: Adequate  natural dentition Self-Feeding Abilities: Able to feed self Patient Positioning: Upright in bed Baseline Vocal Quality: Clear Volitional Cough: Strong Volitional Swallow: Able to elicit    Oral/Motor/Sensory Function Overall Oral Motor/Sensory Function: Appears within functional limits for tasks assessed   Ice Chips     Thin Liquid Thin Liquid: Within functional limits Other Comments: pt disgusted after drinking water, bad taste, complains of stomach pains after    Nectar Thick Nectar Thick Liquid: Not tested   Honey Thick Honey Thick Liquid: Not tested   Puree Puree: Within functional limits   Solid Solid: Impaired Presentation: Self Fed Oral Phase Functional Implications:  (prolonged mastication, difficutly with a-p transit)    Solaris Kram, Riley Nearing 02/12/2012,3:19 PM

## 2012-02-12 NOTE — Care Management Note (Signed)
    Page 1 of 2   02/13/2012     3:11:40 PM   CARE MANAGEMENT NOTE 02/13/2012  Patient:  Craig [name], Craig [name]   Account Number:  [id number]  Date Initiated:  02/12/2012  Documentation initiated by:  Letha Cape  Subjective/Objective Assessment:   dx dm (new onset) hyperglycemia, hx of stomach ca - taking chemo per spouse.  admit- lives alone.     Action/Plan:   pt eval   Anticipated DC Date:  02/13/2012   Anticipated DC Plan:  HOME W HOME HEALTH SERVICES      DC Planning Services  CM consult  Medication Assistance  Follow-up appt scheduled      [facility] Choice  HOME HEALTH   Choice offered to / List presented to:  C-1 Patient        HH arranged  HH-1 RN  HH-2 PT      [facility] agency  [facility]   Status of service:  Completed, signed off Medicare Important Message given?   (If response is "NO", the following Medicare IM given date fields will be blank) Date Medicare IM given:   Date Additional Medicare IM given:    Discharge Disposition:  HOME/SELF CARE  Per UR Regulation:  Reviewed for med. necessity/level of care/duration of stay  If discussed at Long Length of Stay Meetings, dates discussed:    Comments:  PCP [facility] apt is 7/3 at 1:30  02/13/12 11:47 Letha Cape RN, BSN [phone] today patient and girlfriend state they can not afford the co-pay for MD visit.  [facility]  I called them to see if they could get this patient in , they stated to call Family Practice because they could get him in quicker because they will not be able to see patient until Sept.  I called Family Practice they faxed new patient packet over to me and I had patient 's girlfriend fill it out and this was faxed back to [facility].  Patient has an appt scheduled for 7/3 at 1:30.  I also printed out sante fe pap and novolog pap information.  Patient's girlfriend states patient will have a meter that is being donated to him  along with strips and needle container.  I also printed out [employer] transportation form, girlfriend filled out part A and MD is filling out part B, will fax over when completed. Patient is for dc today.  Patient will need medication assistance.  NCM will continue to follow for other dc needs.  Patient wants hh services and would like to work with [facility],  referral made to [facility] for [facility], HHPT would like an aide but since self pay can not have an aide.  Marie notified.  Soc will begin 24-48 hrs post discharge.  02/12/12 12:01 Letha Cape RN, BSN [phone] patient lives alone, pta independent,  patient is eligible for med ast if needed.  Patient is a new onset Diabetic. Patient has transportation at dc to get home.  I contacted financial counselor to have them speak with wife and patient about medicare disability and medicaid. Per Financial counsleor patient has an apt on Wed a t 1:30 here for disability, if patient is dc before then , then patient's wife will come back for appt.

## 2012-02-12 NOTE — Progress Notes (Signed)
Inpatient Diabetes Program Recommendations  AACE/ADA: New Consensus Statement on Inpatient Glycemic Control (2009)  Target Ranges:  Prepandial:   less than 140 mg/dL      Peak postprandial:   less than 180 mg/dL (1-2 hours)      Critically ill patients:  140 - 180 mg/dL   Note: New onset diabetes.  Request MD order a Hgb A1C.  Will continue assessment of patient later today. Trixie Maclaren S. Elsie Lincoln, RN, CNS, CDE  Pager [phone]

## 2012-02-13 DIAGNOSIS — E119 Type 2 diabetes mellitus without complications: Secondary | ICD-10-CM

## 2012-02-13 LAB — CBC
HCT: 40.8 % (ref 39.0–52.0)
MCHC: 33.6 g/dL (ref 30.0–36.0)
MCV: 84.8 fL (ref 78.0–100.0)
RDW: 13.9 % (ref 11.5–15.5)
WBC: 6.8 10*3/uL (ref 4.0–10.5)

## 2012-02-13 LAB — BASIC METABOLIC PANEL
BUN: 11 mg/dL (ref 6–23)
CO2: 20 mEq/L (ref 19–32)
Calcium: 9.4 mg/dL (ref 8.4–10.5)
GFR calc Af Amer: >90 mL/min (ref >90)
GFR calc non Af Amer: >90 mL/min (ref >90)
GFR calc non Af Amer: 85 mL/min — ABNORMAL LOW (ref >90)
Glucose, Bld: 166 mg/dL — ABNORMAL HIGH (ref 70–99)
Potassium: 4.2 mEq/L (ref 3.5–5.1)
Sodium: 138 mEq/L (ref 135–145)

## 2012-02-13 LAB — GLUCOSE, CAPILLARY
Glucose-Capillary: 164 mg/dL — ABNORMAL HIGH (ref 70–99)
Glucose-Capillary: 184 mg/dL — ABNORMAL HIGH (ref 70–99)
Glucose-Capillary: 308 mg/dL — ABNORMAL HIGH (ref 70–99)
Glucose-Capillary: 339 mg/dL — ABNORMAL HIGH (ref 70–99)
Glucose-Capillary: 348 mg/dL — ABNORMAL HIGH (ref 70–99)

## 2012-02-13 MED ORDER — FLUCONAZOLE 100 MG PO TABS: 100.0000 mg | ORAL_TABLET | Freq: Every day | ORAL | Status: AC

## 2012-02-13 MED ORDER — INSULIN ASPART 100 UNIT/ML ~~LOC~~ SOLN: 0.0000 [IU] | Freq: Three times a day (TID) | SUBCUTANEOUS | Status: DC

## 2012-02-13 MED ORDER — LIVING WELL WITH DIABETES BOOK
Freq: Once | Status: AC
  Administered 2012-02-13: 1
  Filled 2012-02-13: qty 1

## 2012-02-13 MED ORDER — BD GETTING STARTED TAKE HOME KIT: 1/2ML X 30G SYRINGES
1.0000 | Freq: Once | Status: AC
  Administered 2012-02-13: 1
  Filled 2012-02-13: qty 1

## 2012-02-13 MED ORDER — INSULIN GLARGINE 100 UNIT/ML ~~LOC~~ SOLN: 50.0000 [IU] | Freq: Every day | SUBCUTANEOUS | Status: DC

## 2012-02-13 MED ORDER — CHLORHEXIDINE GLUCONATE 0.12 % MT SOLN: 15.0000 mL | Freq: Four times a day (QID) | OROMUCOSAL | Status: AC

## 2012-02-13 MED ORDER — INSULIN ASPART 100 UNIT/ML ~~LOC~~ SOLN
0.0000 [IU] | Freq: Three times a day (TID) | SUBCUTANEOUS | Status: DC
  Administered 2012-02-13: 15 [IU] via SUBCUTANEOUS
  Administered 2012-02-13: 7 [IU] via SUBCUTANEOUS
  Administered 2012-02-13: 15 [IU] via SUBCUTANEOUS

## 2012-02-13 MED ORDER — GLUCERNA SHAKE PO LIQD: 237.0000 mL | Freq: Three times a day (TID) | ORAL | Status: DC

## 2012-02-13 NOTE — Discharge Summary (Signed)
Patient ID: Craig [name] MRN: [id number] DOB/AGE: September 23, 1960 51 y.o.  Admit date: 02/11/2012 Discharge date: 02/13/2012  Primary Care Physician:  [facility], Everlene Other, [place]  Discharge Diagnoses:   Principal Problem:  *Diabetic ketoacidosis Active Problems:  Dehydration  Chest pain  Abdominal pain  Morbid Obesity  Craig [name]  History of Stomach Cancer (no documentation in system) on Chemotherapy since 2001  Medication List  As of 02/13/2012  5:57 PM   TAKE these medications         chlorhexidine 0.12 % solution   Commonly known as: PERIDEX   Use as directed 15 mLs in the mouth or throat 4 (four) times daily.      feeding supplement Liqd   Take 237 mLs by mouth 3 (three) times daily between meals.      fluconazole 100 MG tablet   Commonly known as: DIFLUCAN   Take 1 tablet (100 mg total) by mouth daily. For presumed candida in throat      insulin aspart 100 UNIT/ML injection   Commonly known as: novoLOG   Inject 0-20 Units into the skin 3 (three) times daily with meals. CBG 70 - 120: 0 units  CBG 121 - 150: 3 units  CBG 151 - 200: 4 units  CBG 201 - 250: 7 units  CBG 251 - 300: 11 units  CBG 301 - 350: 15 units  CBG 351 - 400: 20 units      insulin glargine 100 UNIT/ML injection   Commonly known as: LANTUS   Inject 50 Units into the skin at bedtime.      Omeprazole-Sodium Bicarbonate 20-1100 MG Caps   Commonly known as: ZEGERID   Take 1 capsule by mouth daily before breakfast.            Consults:  Case management, Social work, Diabetes Coordinator  Brief H and P:  51 yo morbidly obese AA male with a reported history of stomach cancer presented to the [facility] ED complaining of nausea, difficulty eating,  increased frequency of urination and increased thirst over the last 2 weeks.  He was found to be in DKA with a cbg of 473 and a bicarb of 12.    1. DKA. Newly diagnosed diabetic. Hbg A1C not obtained.  Mr. Mathieson remained on the glucose stabilizer  over 24 hours, as it took some time for his bicarb to reach 20. He was transitioned to Lantus and Novolog (70/30 was considered, but his significant other/care taker preferred Lantus and novolog as that is what she takes).  Question whether his chemotherapy could have been contributing to his DM. Followed DKA protocol. Nutrition consult and diabetic coordinator consult were completed.  The patient was referred for out patient diabetes education as well.  Insulin dosages will need to be further adjusted by his primary care physician.   2. Stomach Cancer. Need records. Tried to obtain name of Oncologist in [place]. Knowing the type of cancer and chemotherapy he is on and the specifics of his treatment would have been helpful in providing on-going care.  The patient nor his care taker were able to provide [place] with the name of his oncologist to obtain records.  3. Swallowing difficulty. (Dysphagia?)  The patient complained of difficulty swallowing and preferred liquids to solids.  Even mashed potatoes caused difficulty.  He not describe odynaphagia, denied that food gets stuck, but rather described vague difficulty swallowing and weight loss. Speech therapy evaluation was called to help sort this out. They  felt the patient had primary oral dysphagia due to dry mouth and metallic or acidic taste.  The patient is on Zegrid at home for reflux.  He was placed on peridex oral rinse and diflucan was added empirically for esophageal candida (given his high CBGs).  At discharge the patient was still having difficulty swallowing and seemed to do well with Glucerna shakes.  Hence he was started on Glucerna shakes until his swallowing improves.  4. Emotional Lability - Patient seemed unusually dependent on his care-taker.  He frequently cried, as well as laughed inappropriately.  He was pleasant and cooperative but unable to directly answer any detailed history question.  He seemed incapable of dosing a injecting himself  with insulin.  Per his caretaker he does not have any diagnosis of mental disability.  However, he does not seem to be able to care for himself. He lives alone.  His care-taker (girlfriend) insisted that she would give him his medicines.  5.  Chest pain and abdominal pain.  At the time of admission the patient had chest pain - worse with exertion but present even at rest.  Cardiac enzymes were cycled and found to be negative.  EKG showed sinus tach with right axis deviation and bi-atrial abnormalities both at the time of admission and on 7/2.  Shortly after admission his chest pain resolved and did not return during the rest of his hospitalization.  6.  Morbid Obesity.  174 kg on 6/30.  Diagnosed with morbid obesity per nutritionist. Nutrition education provided.  Physical Exam on Discharge: General: Alert, awake, oriented x3, in no acute distress. Pleasant, sitting in recliner. Heart: Regular rate and rhythm, without murmurs, rubs, gallops. Lungs: Clear to auscultation bilaterally. Abdomen: Massive Soft, nontender, nondistended, positive bowel sounds.  Extremities: No clubbing cyanosis or edema with positive pedal pulses. Skin darkened in color in his distal extremities with out signs of infection. Neuro: Grossly intact, nonfocal. Presents as mildly learning disabled.  Filed Vitals:   02/12/12 1332 02/12/12 1916 02/13/12 0559 02/13/12 1400  BP: 98/66 112/73 108/69 145/76  Pulse: 98 113 86 93  Temp: 98.1 F (36.7 C) 98.1 F (36.7 C) 97.6 F (36.4 C) 98.3 F (36.8 C)  TempSrc: Oral Oral Oral Oral  Resp: 18 18 20 16   Height:      Weight:      SpO2: 98% 93% 97% 98%     Intake/Output Summary (Last 24 hours) at 02/13/12 1757 Last data filed at 02/13/12 0900  Gross per 24 hour  Intake    975 ml  Output    500 ml  Net    475 ml    Basic Metabolic Panel:  Lab 02/13/12 1610 02/12/12 2307  NA 138 136  K 4.3 4.2  CL 103 101  CO2 20 18*  GLUCOSE 149* 166*  BUN 10 11  CREATININE  1.02 0.98  CALCIUM 9.4 9.5  MG -- --  PHOS -- --   Liver Function Tests:  Lab 02/11/12 2158  AST 22  ALT 28  ALKPHOS 110  BILITOT 0.4  PROT 8.5*  ALBUMIN 3.7    Lab 02/11/12 2158  LIPASE 45  AMYLASE --   CBC:  Lab 02/13/12 0836 02/12/12 0625 02/11/12 1801  WBC 6.8 10.6* --  NEUTROABS -- -- 8.0*  HGB 13.7 14.2 --  HCT 40.8 44.1 --  MCV 84.8 86.3 --  PLT PLATELET CLUMPS NOTED ON SMEAR, COUNT APPEARS ADEQUATE 206 --   Cardiac Enzymes:  Lab 02/12/12 1433  02/12/12 0900 02/11/12 2359  CKTOTAL 525* 549* 556*  CKMB 5.0* 5.4* 5.7*  CKMBINDEX -- -- --  TROPONINI <0.30 <0.30 <0.30   CBG:  Lab 02/13/12 1751 02/13/12 1617 02/13/12 1158 02/13/12 0747 02/13/12 0354 02/13/12 0303  GLUCAP 339* 348* 308* 237* 136* 138*    Drugs of Abuse     Component Value Date/Time   LABOPIA NONE DETECTED 02/12/2012 0452   COCAINSCRNUR NONE DETECTED 02/12/2012 0452   LABBENZ NONE DETECTED 02/12/2012 0452   AMPHETMU NONE DETECTED 02/12/2012 0452   THCU NONE DETECTED 02/12/2012 0452   LABBARB NONE DETECTED 02/12/2012 0452    Significant Diagnostic Studies:  Dg Chest 2 View  02/11/2012  *RADIOLOGY REPORT*  Clinical Data: Leg swelling, nausea.  CHEST - 2 VIEW  Comparison: None  Findings: Heart is borderline in size.  There are low lung volumes with vascular congestion and bibasilar atelectasis.  No overt edema.  No effusions or acute bony abnormality.  IMPRESSION: Cardiomegaly, vascular congestion.  Low lung volumes with bibasilar atelectasis.  Original Report Authenticated By: Cyndie Chime, M.D.    Disposition and Follow-up: Stable for discharge to home with the assistance of his girl friend, Lanora Manis.    Discharge Orders    Future Appointments: Provider: Department: Dept Phone: Center:   02/14/2012 1:30 PM Tommie Sams, DO [facility] Res[facility] [phone] [facility]     Future Orders Please Complete By Expires   Ambulatory Referral to DSME/T      Comments:   Applying for Medication Assistance Program.   Patient seems to have barriers to learning diabetes self-care-- relying a lot on others to get assistance for him.  Probably overwhelmed with diabetes diagnosis.   Questions: Responses:   Check all special needs that apply to patient requiring 1 on 1 DSME/T Additional training Comment - New onset diabetes.    DSME/T Content Diabetes as disease process    Goal setting, problem solving    Medications    Monitoring Diabetes    Nutritional management    Physical activity    Prevent, detect and treat acute complications    Prevent, detect and treat chronic complications    Psychological adjustment   Complications/Comorbidities Obesity    Other (see comments) Comment - Cancer of the stomach.  Going for monthly treatment in [place].   Choose type of training services and number of hours requested Follow-up DSME/T:  enter hours in comments Comment - As appropriate   Diet Carb Modified      Increase activity slowly      Discharge instructions        Follow-up Information    Follow up with Everlene Other, DO on 02/14/2012. (1:30 at [facility])    Contact information:   [address] [place] [address] [phone]           Time spent on Discharge: 40 min.  SignedStephani Police 02/13/2012, 5:57 PM [phone]

## 2012-02-13 NOTE — Evaluation (Signed)
Physical Therapy Evaluation Patient Details Name: Craig [name] MRN: [id number] DOB: 09/19/1960 Today's Date: 02/13/2012 Time: 1914-7829 PT Time Calculation (min): 13 min  PT Assessment / Plan / Recommendation Clinical Impression  Pt adm with DKA.  Pt with newly diagnosed DM.  Pt with limited activity at home.  Will try a walker later to see if that improves gait endurance.    PT Assessment  Patient needs continued PT services    Follow Up Recommendations  No PT follow up;Supervision - Intermittent    Barriers to Discharge        Equipment Recommendations  Other (comment) (signicant other has rolling walker available for pt.)    Recommendations for Other Services     Frequency Min 3X/week    Precautions / Restrictions Precautions Precautions: Fall Restrictions Weight Bearing Restrictions: No   Pertinent Vitals/Pain Stomach and back pain with activity      Mobility  Bed Mobility Bed Mobility: Supine to Sit;Sit to Supine Supine to Sit: 6: Modified independent (Device/Increase time);HOB flat Sit to Supine: 6: Modified independent (Device/Increase time);HOB flat Details for Bed Mobility Assistance: Pt went into supine horizontally in bed to adjust pajama pants and then returned to sitting. Transfers Transfers: Sit to Stand;Stand to Sit Sit to Stand: 4: Min assist;With upper extremity assist;From bed Stand to Sit: 4: Min guard;To chair/3-in-1;With armrests;With upper extremity assist Details for Transfer Assistance: Pt asking for assistance up since he hadn't been up. Ambulation/Gait Ambulation/Gait Assistance: 4: Min assist Ambulation Distance (Feet): 50 Feet Assistive device: Other (Comment) (wall rail) Ambulation/Gait Assistance Details: pt became fatigued with incr stomach and back pain and needed to have recliner brought to him in hallway. Gait Pattern: Wide base of support;Decreased stride length Gait velocity: slow pace    Exercises     PT Diagnosis: Difficulty  walking  PT Problem List: Decreased activity tolerance;Decreased mobility;Decreased knowledge of use of DME PT Treatment Interventions: DME instruction;Gait training;Functional mobility training;Therapeutic activities;Therapeutic exercise;Patient/family education   PT Goals Acute Rehab PT Goals PT Goal Formulation: With patient Time For Goal Achievement: 02/20/12 Potential to Achieve Goals: Good Pt will go Sit to Stand: with modified independence PT Goal: Sit to Stand - Progress: Goal set today Pt will go Stand to Sit: with modified independence PT Goal: Stand to Sit - Progress: Goal set today Pt will Ambulate: 16 - 50 feet;with modified independence;with least restrictive assistive device PT Goal: Ambulate - Progress: Goal set today  Visit Information  Last PT Received On: 02/13/12 Assistance Needed: +1    Subjective Data  Subjective: Pt stated that he uses a rolling office chair to roll around his house at times. Patient Stated Goal: Return home.   Prior Functioning  Home Living Lives With: Significant other Available Help at Discharge: Family Type of Home: Apartment Home Access: Level entry Home Layout: One level Bathroom Shower/Tub: Tub/shower unit;Curtain Firefighter: Standard Home Adaptive Equipment: Shower chair without back;Walker - rolling (girlfriend stated she has walker at home he can use.) Additional Comments: Pt. was using wooden stool in tub for showers Prior Function Level of Independence: Independent Able to Take Stairs?: No Vocation: On disability Comments: Pt. would sometimes have significant other assist in washing his backside to help conserve energy due to it fatigues pt. to complete task Communication Communication: No difficulties    Cognition  Overall Cognitive Status: Impaired Arousal/Alertness: Awake/alert Orientation Level: Appears intact for tasks assessed Cognition - Other Comments: Pt appears emotionally labile at times.  Crying in  hallway after  needing to sit down and less than a minute later laughing with significant other.    Extremity/Trunk Assessment Right Lower Extremity Assessment RLE ROM/Strength/Tone: [facility] for tasks assessed Left Lower Extremity Assessment LLE ROM/Strength/Tone: WFL for tasks assessed   Balance Static Standing Balance Static Standing - Balance Support: Left upper extremity supported Static Standing - Level of Assistance: 5: Stand by assistance  End of Session PT - End of Session Equipment Utilized During Treatment:  (Pt too large for gait belt) Activity Tolerance: Patient limited by pain;Patient limited by fatigue Patient left: in chair;with call bell/phone within reach;with family/visitor present Nurse Communication: Mobility status  GP     [facility] 02/13/2012, 2:37 PM  [facility] PT [phone]

## 2012-02-13 NOTE — Plan of Care (Signed)
Problem: Discharge Progression Outcomes Goal: Obtain signed CBG meter Rx form Outcome: Not Applicable Date Met:  02/13/12 Girlfriend already ordered one for him

## 2012-02-13 NOTE — Clinical Social Work Psychosocial (Signed)
     Clinical Social Work Department BRIEF PSYCHOSOCIAL ASSESSMENT 02/13/2012  Patient:  Craig [name], Craig [name]     Account Number:  [id number]     Admit date:  02/11/2012  Clinical Social Worker:  Jacelyn Grip  Date/Time:  02/13/2012 09:45 AM  Referred by:  Physician  Date Referred:  02/13/2012 Referred for  Other - See comment   Other Referral:   psychosocial needs-pt electricity currently turned off   Interview type:  Patient Other interview type:    PSYCHOSOCIAL DATA Living Status:  ALONE Admitted from facility:   Level of care:   Primary support name:  Lanora Manis Cowgill/spouse Primary support relationship to patient:  SPOUSE Degree of support available:   adequate    CURRENT CONCERNS Current Concerns  Post-Acute Placement   Other Concerns:    SOCIAL WORK ASSESSMENT / PLAN CSW received notification from Physician Assistant that pt and pt caretaker requesting social worker and would like assistance with having electricity turned back on. CSW met with pt and pt caretaker/spouse, Craig [name] at bedside. Pt was on the phone at this time and pt caretaker reported pt was on the phone with electrical company inquiring about having electricity turned back on. Pt completed phone call and stated that electicity will be turned back on today. CSW discussed with pt and pt caretaker assistance programs for electrical assistance if needed in the future. Pt caretaker discussed the assistance that she has been applying for to help pt with medications and that they are applying and hopeful that pt can be accepted by the internal medicine clinic for pt PCP. RNCM following to assist with PCP and medication needs. Pt and pt caretaker did not identify any further social work needs at this time. CSW signing off at this time. Please re-consult if further needs arise   Assessment/plan status:  No Further Intervention Required Other assessment/ plan:   Information/referral to community resources:     Information in regard to Patent attorney.    PATIENTS/FAMILYS RESPONSE TO PLAN OF CARE: Pt alert and oriented. Pt caretaker is a great advocate for pt and utilizing resources to assist pt and pt is planning to apply for disability. CSW provided positive re-inforcement for pt and pt caretaker exploring resources and utilizing the appropriate resources.

## 2012-02-13 NOTE — Progress Notes (Signed)
Pt has order to D/C home with home health. Prescriptions and instructions given to patient. Meds delivered from pharmacy to patient. Diabetes education given. All videos watched, book given, patient demonstrated self injection of insulin as well as drawing up correct amount of insulin. All questions answered. Skin intact, scattered bruising. CBGs stable. Pt awaiting dinner to come prior to discharge.

## 2012-02-13 NOTE — Evaluation (Signed)
Occupational Therapy Evaluation Patient Details Name: Craig [name] MRN: [id number] DOB: [birth year] Today's Date: 02/13/2012 Time: 4098-1191 OT Time Calculation (min): 15 min  OT Assessment / Plan / Recommendation Clinical Impression  Pt. presents with DKA and history of stomach CA and mobidly obese. Pt. will benefit from skilled OT to increase functional independence and safety with ADLs to supervision level at D/C home.    OT Assessment  Patient needs continued OT Services    Follow Up Recommendations  No OT follow up;Supervision - Intermittent    Barriers to Discharge Decreased caregiver support Significant other not always there  Equipment Recommendations  Other (comment) (signicant other has rolling walker available for pt.); tub transfer bench       Frequency  Min 2X/week    Precautions / Restrictions Precautions Precautions: Fall Restrictions Weight Bearing Restrictions: No   Pertinent Vitals/Pain 5/10 in abdomen due to nausea and 10/10 intermittent back    ADL  Eating/Feeding: Performed;Independent Where Assessed - Eating/Feeding: Edge of bed Grooming: Performed;Wash/dry hands;Set up;Min guard Where Assessed - Grooming: Unsupported standing Upper Body Bathing: Simulated;Set up Where Assessed - Upper Body Bathing: Unsupported sitting Lower Body Bathing: Simulated;Minimal assistance Where Assessed - Lower Body Bathing: Unsupported sit to stand Upper Body Dressing: Performed;Set up (don gown) Where Assessed - Upper Body Dressing: Unsupported sitting Lower Body Dressing: Performed;Min guard (pulling up pants min guard for balance ) Where Assessed - Lower Body Dressing: Unsupported sit to stand Toilet Transfer: Simulated;Minimal assistance Toilet Transfer Method: Sit to stand Toilet Transfer Equipment: Other (comment) (recliner) Toileting - Clothing Manipulation and Hygiene: Simulated;Set up Where Assessed - Toileting Clothing Manipulation and Hygiene: Sit to stand  from 3-in-1 or toilet Tub/Shower Transfer Method: Not assessed Transfers/Ambulation Related to ADLs: Pt. provided with intermittent min hand held assist due to decreased balance and overall strength. Pt completed ~25' with use of railing along hallway. Due to increased abdominal pain from nausea pt. provided with recliner and sat. ADL Comments: Pt. with decreased activity tolerance and fatigues very quickly. Pt. emotionally labile during session and began joking around with therapist, then began crying in the hallway when abdominal pain started, then was laughing upon return to room in recliner.    OT Diagnosis: Generalized weakness  OT Problem List: Decreased strength;Decreased activity tolerance;Impaired balance (sitting and/or standing);Decreased safety awareness;Decreased knowledge of use of DME or AE;Pain;Obesity OT Treatment Interventions: Self-care/ADL training;Energy conservation;DME and/or AE instruction;Therapeutic activities;Patient/family education;Balance training   OT Goals Acute Rehab OT Goals OT Goal Formulation: With patient Time For Goal Achievement: 02/20/12 Potential to Achieve Goals: Good ADL Goals Pt Will Perform Grooming: with set-up;with supervision;Standing at sink ADL Goal: Grooming - Progress: Goal set today Pt Will Perform Upper Body Dressing: with set-up;Standing ADL Goal: Upper Body Dressing - Progress: Goal set today Pt Will Perform Lower Body Dressing: with set-up;with supervision;Sit to stand from chair;with adaptive equipment ADL Goal: Lower Body Dressing - Progress: Goal set today Pt Will Transfer to Toilet: with supervision;with set-up;Ambulation;Regular height toilet ADL Goal: Toilet Transfer - Progress: Goal set today Additional ADL Goal #1: pt. will recall 3 energy conservation techniques with ADLs ADL Goal: Additional Goal #1 - Progress: Goal set today  Visit Information  Last OT Received On: 02/13/12 Assistance Needed: +1 PT/OT  Co-Evaluation/Treatment: Yes    Subjective Data  Subjective: "I am ready to get dancing" Patient Stated Goal: "Go home"   Prior Functioning  Home Living Lives With: Significant other Available Help at Discharge: Family Type of Home: Apartment Home Access:  Level entry Home Layout: One level Bathroom Shower/Tub: Tub/shower unit;Curtain Firefighter: Standard Home Adaptive Equipment: Shower chair without back;Walker - rolling (girlfriend stated she has walker at home he can use.) Additional Comments: Pt. was using wooden stool in tub for showers Prior Function Level of Independence: Independent Able to Take Stairs?: No Vocation: On disability Comments: Pt. would sometimes have significant other assist in washing his backside to help conserve energy due to it fatigues pt. to complete task Communication Communication: No difficulties    Cognition  Overall Cognitive Status: Impaired Area of Impairment: Safety/judgement;Awareness of deficits Arousal/Alertness: Awake/alert Orientation Level: Appears intact for tasks assessed Behavior During Session: Other (comment) (emotionally labile) Safety/Judgement: Decreased awareness of safety precautions;Decreased safety judgement for tasks assessed;Decreased awareness of need for assistance Safety/Judgement - Other Comments: Pt. attempting to pull on therapist to attempt stand even after just stating no need for assist and that he could "do it himself" Cognition - Other Comments: Pt appears emotionally labile at times.  Crying in hallway after needing to sit down and less than a minute later laughing with significant other.    Extremity/Trunk Assessment Right Upper Extremity Assessment RUE ROM/Strength/Tone: Within functional levels RUE Sensation: WFL - Light Touch RUE Coordination: WFL - gross/fine motor Left Upper Extremity Assessment LUE ROM/Strength/Tone: Within functional levels LUE Sensation: WFL - Light Touch LUE Coordination: WFL  - gross/fine motor Right Lower Extremity Assessment RLE ROM/Strength/Tone: WFL for tasks assessed Left Lower Extremity Assessment LLE ROM/Strength/Tone: WFL for tasks assessed   Mobility Bed Mobility Bed Mobility: Supine to Sit;Sit to Supine Supine to Sit: 6: Modified independent (Device/Increase time);HOB flat Sit to Supine: 6: Modified independent (Device/Increase time);HOB flat Details for Bed Mobility Assistance: Pt went into supine horizontally in bed to adjust pajama pants and then returned to sitting. Transfers Transfers: Sit to Stand;Stand to Sit Sit to Stand: 4: Min assist;With upper extremity assist;From bed Stand to Sit: 4: Min guard;To chair/3-in-1;With armrests;With upper extremity assist Details for Transfer Assistance: Pt asking for assistance up since he hadn't been up.      Balance Static Standing Balance Static Standing - Balance Support: Left upper extremity supported Static Standing - Level of Assistance: 5: Stand by assistance  End of Session OT - End of Session Activity Tolerance: Patient limited by fatigue;Patient limited by pain Patient left: in chair;with call bell/phone within reach;with family/visitor present Nurse Communication: Mobility status       Shandi Godfrey, OTR/L Pager [phone] 02/13/2012, 2:50 PM

## 2012-02-13 NOTE — Progress Notes (Signed)
Followed up with patient and [facility] regarding new diagnosis of diabetes.  Presently Ms. Dover and patient need to complete paperwork to apply for [facility] follow-up after discharge instead of with providers previously arranged.  Applying for orange card to assist with medications.  Patient has given an insulin injection.  Ms. Kearney Hard states that she can assist patient with insulin administration.  Ms. Kearney Hard says that she has found a source for a meter for the patient with ongoing lancets and test strips.  Found another source for alcohol swabs and sharps container.  Gave them information regarding [employer]'s ReliOn Prime meter which uses least expensive test strips-- in case this resource becomes unavailable.  Patient seems to be delegating a lot of responsibility to Ms. Dover.  Will return later to talk with them. Darden Flemister S. Elsie Lincoln, RN, CNS, CDE  [phone])

## 2012-02-13 NOTE — Progress Notes (Signed)
Inpatient Diabetes Program Recommendations  AACE/ADA: New Consensus Statement on Inpatient Glycemic Control (2009)  Target Ranges:  Prepandial:   less than 140 mg/dL      Peak postprandial:   less than 180 mg/dL (1-2 hours)      Critically ill patients:  140 - 180 mg/dL   Reason for Visit: Follow-up regarding diagnosis of new-onset diabetes  Note: Have ordered full Outpatient Diabetes Education by the [facility] (MD co-sign required) since patient will receive his medical care there.  Patient concerned that electrical power will not be turned back on until tomorrow.  Continuing to watch diabetes videos.  Janie Morning at bedside.  To have visit by [facility] nurse.  Gave patient a log book so he can keep track of insulin and CBG's.  Brief review of signs and symptoms of low blood sugar and appropriate treatment options.  Nurse continuing with education. Mary Hockey S. Elsie Lincoln, RN, CNS, CDE  [phone])

## 2012-02-13 NOTE — Progress Notes (Signed)
02/13/12 1442  PT Visit Information  Last PT Received On 02/13/12  Assistance Needed +1  PT Time Calculation  PT Start Time 1405  PT Stop Time 1414  PT Time Calculation (min) 9 min  Subjective Data  Subjective Pt asking if someone could give him a massage to his back.  Precautions  Precautions Fall  Cognition  Overall Cognitive Status Impaired  Area of Impairment Safety/judgement;Awareness of deficits  Arousal/Alertness Awake/alert  Orientation Level Appears intact for tasks assessed  Behavior During Session Other (comment)  Cognition - Other Comments Pt appears emotionally labile at times.  Crying in hallway after needing to sit down and less than a minute later laughing with significant other.  Transfers  Sit to Stand With armrests;From chair/3-in-1;6: Modified independent (Device/Increase time)  Stand to Sit 6: Modified independent (Device/Increase time);With upper extremity assist;To chair/3-in-1;With armrests  Ambulation/Gait  Ambulation/Gait Assistance 5: Supervision  Ambulation Distance (Feet) 30 Feet  Assistive device Rolling walker  Ambulation/Gait Assistance Details Improved gait with rolling walker  Gait Pattern Wide base of support;Decreased stride length  Gait velocity slow pace  Static Standing Balance  Static Standing - Balance Support Bilateral upper extremity supported  Static Standing - Level of Assistance 6: Modified independent (Device/Increase time)  PT - End of Session  Activity Tolerance Patient tolerated treatment well  Patient left in chair;with call bell/phone within reach;with family/visitor present  Nurse Communication Mobility status  PT - Assessment/Plan  Comments on Treatment Session Pt with improved gait with rolling walker.  Has one available to use.  Pt with very limited activity prior to admission.  PT Plan Discharge plan remains appropriate;Frequency remains appropriate  PT Frequency Min 3X/week  Follow Up Recommendations No PT follow  up;Supervision - Intermittent  Equipment Recommended None recommended by PT  Acute Rehab PT Goals  PT Goal: Sit to Stand - Progress Met  PT Goal: Stand to Sit - Progress Met  PT Goal: Ambulate - Progress Progressing toward goal  PT General Charges  $$ ACUTE PT VISIT 1 Procedure  PT Treatments  $Gait Training 8-22 mins    [facility] PT [phone]

## 2012-02-13 NOTE — Progress Notes (Signed)
-  Agree with plan as above, have reviewed the data. We'll change him to Lantus. -Will need close followup by his primary care Dr.

## 2012-02-13 NOTE — Progress Notes (Signed)
Nutrition Brief Note:   RD consulted for diet education for new onset DM. RD provided pt with education yesterday, 7/1. See full assessment for education note.  RD spoke with pt this morning, no additional diet questions. Pt reports he has a strong support network at home of friends who will be helping him with meals. No additional needs at this time. RD will continue to follow.   Clarene Duke RD, LDN Pager [phone] After Hours pager [phone]

## 2012-02-14 ENCOUNTER — Encounter: Payer: Self-pay | Admitting: Family Medicine

## 2012-02-14 ENCOUNTER — Ambulatory Visit (INDEPENDENT_AMBULATORY_CARE_PROVIDER_SITE_OTHER): Payer: Self-pay | Admitting: Family Medicine

## 2012-02-14 ENCOUNTER — Telehealth: Payer: Self-pay | Admitting: Family Medicine

## 2012-02-14 VITALS — BP 131/87 | HR 114 | Temp 98.4°F | Ht 71.0 in | Wt 390.5 lb

## 2012-02-14 DIAGNOSIS — C169 Malignant neoplasm of stomach, unspecified: Secondary | ICD-10-CM

## 2012-02-14 DIAGNOSIS — R1310 Dysphagia, unspecified: Secondary | ICD-10-CM | POA: Insufficient documentation

## 2012-02-14 DIAGNOSIS — E1165 Type 2 diabetes mellitus with hyperglycemia: Secondary | ICD-10-CM | POA: Insufficient documentation

## 2012-02-14 DIAGNOSIS — E119 Type 2 diabetes mellitus without complications: Secondary | ICD-10-CM

## 2012-02-14 DIAGNOSIS — E1110 Type 2 diabetes mellitus with ketoacidosis without coma: Secondary | ICD-10-CM

## 2012-02-14 DIAGNOSIS — Z85028 Personal history of other malignant neoplasm of stomach: Secondary | ICD-10-CM | POA: Insufficient documentation

## 2012-02-14 DIAGNOSIS — R739 Hyperglycemia, unspecified: Secondary | ICD-10-CM

## 2012-02-14 DIAGNOSIS — R7309 Other abnormal glucose: Secondary | ICD-10-CM

## 2012-02-14 HISTORY — DX: Personal history of other malignant neoplasm of stomach: Z85.028

## 2012-02-14 MED ORDER — INSULIN ASPART 100 UNIT/ML ~~LOC~~ SOLN: 0.0000 [IU] | Freq: Three times a day (TID) | SUBCUTANEOUS | Status: DC

## 2012-02-14 MED ORDER — GLUCOSE BLOOD VI STRP: ORAL_STRIP | Status: AC

## 2012-02-14 NOTE — Assessment & Plan Note (Deleted)
Continue prescribed insulin regimen (Lantus 50 U and sliding scale insulin)  Scheduled to see Pharmacy clinic for DM management.  Follow up with me in 1 month regarding DM.  Will order A1C at that time.

## 2012-02-14 NOTE — Progress Notes (Signed)
Speech Language Pathology Dysphagia Treatment - Late Entry Patient Details Name: Craig [name] MRN: [id number] DOB: [birth year] Today's Date: 02/14/2012 Time: 1478-2956 SLP Time Calculation (min): 8 min  Assessment / Plan / Recommendation Clinical Impression  Pt about to d/c home. Discussed appropriate food texture choices, strategies for oral dysphagia. Oral care at home. Pt will not need any SLP f/u at this time but discussed f/u with primary MD if oral dysphagia does not improve with medical managment of DKA.     Diet Recommendation  Continue with Current Diet: Dysphagia 3 (mechanical soft);Thin liquid    SLP Plan     Pertinent Vitals/Pain NA   Swallowing Goals  SLP Swallowing Goals Patient will consume recommended diet without observed clinical signs of aspiration with: Independent assistance Swallow Study Goal #1 - Progress: Progressing toward goal Patient will utilize recommended strategies during swallow to increase swallowing safety with: Independent assistance Swallow Study Goal #2 - Progress: Progressing toward goal  General Temperature Spikes Noted: No Behavior/Cognition: Alert;Cooperative;Pleasant mood Oral Cavity - Dentition: Adequate natural dentition Patient Positioning: Upright in chair  Oral Cavity - Oral Hygiene     Dysphagia Treatment Treatment focused on: Patient/family/caregiver education;Utilization of compensatory strategies Family/Caregiver Educated: friend Patient observed directly with PO's: No Reason PO's not observed: Other (comment) (education only, no meal present) Feeding: Able to feed self   GO     Craig [name], Craig [name] 02/14/2012, 8:07 AM

## 2012-02-14 NOTE — Telephone Encounter (Signed)
Patient wife calls about BS 411. This was 428 in clinic today and 421 in the afternoon. His symptoms have not changed. He is eating well. Planning on taking 20 units novolog with dinner (took 20 u with lunch also). Was not taking correct dose of lantus-had taken 15 units instead of 50 units. i advised to continue with 20 units novolog prior to dinner and to take his bedtime lantus 50 units. Return call if blood sugar 2 hr pp is still > 400. She agrees.

## 2012-02-14 NOTE — Progress Notes (Signed)
Subjective:     Patient ID: Craig [name], male   DOB: 13-Nov-1960, 51 y.o.   MRN: [id number]  HPI 51 year old male here for follow up for newly diagnosed diabetes mellitus.    1) DM-2 - Patient was recently admitted to the hospital on 6/28 after 2 weeks of increasing urination, weight loss, decreased oral intake, and nausea.  He was found to be in DKA with a bicarb of 12.  He was appropriately treated with fluids and insulin.  He was discharge on Lantus 50 U and sliding scale insulin.  According to the patient and his fiance, he has been taking 15 U of Lantus at bedtime and 20 U of Novolog with each meal.  His blood sugars have continued to be elevated.  In the clinic today, his blood sugar was 421.  2) Dyshagia - Over the past 2 weeks, the patient has had complaints of difficulty swallowing. The patient reports difficulty swallowing and prefers liquids and soft foods. While in the hospital speech therapy evaluated him and stated that his dysphagia was likely due to dry mouth.  He was prescribed Peridex oral rinse as well as empiric diflucan for esophageal candidiasis.  He was also started on supplemental glucerna shakes which he has tolerated well.  3) Hx of Gastric Ca - Patient follows with Dr. Laural Benes in [place], [place]   Review of Systems  Denies Chest pain, SOB. Reports weight loss and difficulty swallowing.    Objective:   Physical Exam  Gen:  Morbidly obese.  NAD. CV:  RRR.  No murmurs, rubs or gallops. Resp:  Lungs were clear to auscultation bilaterally. No rales, rhonchi, or wheezing. Abd:  Obese.  Soft.  No palpable masses.     Assessment:         Plan:

## 2012-02-14 NOTE — Patient Instructions (Addendum)
I have adjusted your insulin regimen.  Take 50 Units of Lantus at bedtime.  Check your sugar before each meal and take your novolog according to the blood sugar as indicated below:  121-150 - Take 3 U 151-200 - Take 5 U 201-250 - Take 7 U 251-300 - Take 11 U 301-350 - Take 15 U 351-400 - Take 20 U  Please call if blood sugars are over 400.  Please continue the Zegerid and Pyridex as prescribed.

## 2012-02-16 ENCOUNTER — Telehealth: Payer: Self-pay | Admitting: Family Medicine

## 2012-02-16 NOTE — Telephone Encounter (Signed)
Spoke with Dr. Earnest Bailey who advised that patient take 5 units of the Novalog now and continue with the 50 of Lantus at HS.  I also scheduled patient to see Dr. Raymondo Band next week to review insulin injection just to make sure he and fiance were drawing up the dosages correctly.  Told [facility] RN to go ahead with ST and SW consults.

## 2012-02-16 NOTE — Telephone Encounter (Signed)
Nurse from [facility] is calling because the patients blood sugars are running very high and his insulin isn't even touching it.  She would like the MD to call the patient to discuss that further.  Also, she feels he would benefit from Speech Therapy due to difficulty swallowing and MSW for community resources.

## 2012-02-21 ENCOUNTER — Ambulatory Visit: Payer: Self-pay | Admitting: Endocrinology

## 2012-02-21 NOTE — Discharge Summary (Signed)
-   I have review data and plan as above. -continue insulin regimen follow up with PCP and titrate insulin as tolerate it.

## 2012-02-22 ENCOUNTER — Ambulatory Visit (INDEPENDENT_AMBULATORY_CARE_PROVIDER_SITE_OTHER): Payer: Self-pay | Admitting: Pharmacist

## 2012-02-22 ENCOUNTER — Ambulatory Visit: Payer: Self-pay | Admitting: Endocrinology

## 2012-02-22 ENCOUNTER — Encounter: Payer: Self-pay | Admitting: Pharmacist

## 2012-02-22 VITALS — BP 177/93 | HR 91 | Temp 98.2°F | Ht 71.0 in | Wt >= 6400 oz

## 2012-02-22 DIAGNOSIS — E119 Type 2 diabetes mellitus without complications: Secondary | ICD-10-CM

## 2012-02-22 DIAGNOSIS — R1310 Dysphagia, unspecified: Secondary | ICD-10-CM

## 2012-02-22 MED ORDER — "INSULIN SYRINGE-NEEDLE U-100 30G X 1/2"" 1 ML MISC": 1.0000 | Freq: Every day | Status: DC

## 2012-02-22 MED ORDER — FLUCONAZOLE 100 MG PO TABS: 100.0000 mg | ORAL_TABLET | Freq: Every day | ORAL | Status: DC

## 2012-02-22 NOTE — Assessment & Plan Note (Signed)
Recently diagnosed Diabetes currently under poor control of blood glucose based on home fasting CBG readings of >300 and random CBG readings of 280-450.  Control is suboptimal due to lack of consistent exercise, and suboptimal drug regimen.   He appears to have made significant changes in his diet. Denies hypoglycemic events.  Able to verbalize appropriate hypoglycemia management plan. Adjusted dose of basal insulin Lantus (insulin glargine) to 30 units in the AM and PM AND the patient will continue to titrate 1 unit/dose if fasting CBGs > 150mg /dl until fasting CBGs reach goal or next visit. Patient will continue on Novolog 20 units prior to each meat at this time.   Written patient instructions provided.  Follow up in  Pharmacist Clinic Visit 03/11/2012.   Total time in face to face counseling 75 minutes.

## 2012-02-22 NOTE — Assessment & Plan Note (Signed)
Patient with history of esophageal candidiasis and doing well on fluconazole suppression.  Continue fluconazole suppression tx at 100mg  daily for 3 more weeks until CBGs are improved.

## 2012-02-22 NOTE — Patient Instructions (Addendum)
Lantus  30 units tonight 7/11 and 30 units in the AM 7/12 ON Friday night 7/12 IF your blood sugar is higher than 150 please increase by 1 unit.    Give 31 units.   On Saturday morning 7/13 If your blood sugar is higher than 150 increase to 32 units.  Continue increasing by one unit in the AM and the PM until your blood sugar is < 150 consistently.    Novolog - Give 20 units prior to each meal.  .    Next visit with Pharmacist - July 29th at 9:30 in AM Please bring meter, logbook and meds to next visit.   Fluconazole and new needle prescription will be sent to pharmacy.

## 2012-02-22 NOTE — Progress Notes (Signed)
  Subjective:    Patient ID: Craig [name], male    DOB: 1961-06-20, 51 y.o.   MRN: [id number]  HPI  Recently diagnosed insulin requiring diabetes in a patient who previously weighed 690 lbs per his report.   He reports regaining weight from 380 to > 400lbs today which he understands is most likely fluid after rehydration with hyperglycemia when diagnosed.   Patient home CBG log book shows multiple readings per day and consistent readings in the 300 - 399 range.   Denies hypoglycemia symptoms.   He has run out of fluconazole 100mg  daily for esopahgeal candidiasis.    He has a history of gastric cancer which he believes is in remission.   Exercise is reported as total gym twice daily for 20-30 minutes. 7 days per week.  He is interested is starting to bowl again and states that this would be ideal to be 3 x per week.   He requested 6mm BD insulin syringes as they have a rebate.    Review of Systems     Objective:   Physical Exam        Assessment & Plan:   Recently diagnosed Diabetes currently under poor control of blood glucose based on home fasting CBG readings of >300 and random CBG readings of 280-450.  Control is suboptimal due to lack of consistent exercise, and suboptimal drug regimen.   He appears to have made significant changes in his diet. Denies hypoglycemic events.  Able to verbalize appropriate hypoglycemia management plan. Adjusted dose of basal insulin Lantus (insulin glargine) to 30 units in the AM and PM AND the patient will continue to titrate 1 unit/dose if fasting CBGs > 150mg /dl until fasting CBGs reach goal or next visit. Patient will continue on Novolog 20 units prior to each meat at this time.   Written patient instructions provided.  Follow up in  Pharmacist Clinic Visit 03/11/2012.   Total time in face to face counseling 75 minutes.

## 2012-02-23 NOTE — Progress Notes (Signed)
Patient ID: Craig [name], male   DOB: 08/03/1961, 51 y.o.   MRN: [id number] Reviewed and agree with Dr. Koval's documentation and management. 

## 2012-02-28 ENCOUNTER — Telehealth: Payer: Self-pay | Admitting: Family Medicine

## 2012-02-28 NOTE — Telephone Encounter (Signed)
[facility] is calling for order to continue medication teaching, for 1wk4.  This can be left on Voicemail.

## 2012-02-28 NOTE — Telephone Encounter (Signed)
LMN VO given to continue medication teaching for pt.Craig [name] [place]

## 2012-02-29 ENCOUNTER — Telehealth: Payer: Self-pay | Admitting: Family Medicine

## 2012-02-29 NOTE — Telephone Encounter (Signed)
Patient seen you 7/3, can you write a letter stating this please?

## 2012-02-29 NOTE — Telephone Encounter (Addendum)
Received call from [facility] (therapist) at [facility].  Wants to know if ok to extend therapy services for 1 additional week.  Will route note to Dr. Adriana Simas and call him back at [phone].  Gaylene Brooks, RN

## 2012-02-29 NOTE — Telephone Encounter (Signed)
Needs a letter stating his bath tub needs to be updated to be able to take a shower - he has fallen twice and needs it a letter stating this. Housing Authority - Engineering geologist. pls call when ready

## 2012-03-05 NOTE — Telephone Encounter (Signed)
Ms. boger called back to say she still haven't received refill on patient's med nor have the therapist heard anything back on the letter Dr. Adriana Simas was to write.  Please page provider and have him respond to this asap

## 2012-03-11 ENCOUNTER — Ambulatory Visit (INDEPENDENT_AMBULATORY_CARE_PROVIDER_SITE_OTHER): Payer: Self-pay | Admitting: Pharmacist

## 2012-03-11 ENCOUNTER — Encounter: Payer: Self-pay | Admitting: Pharmacist

## 2012-03-11 VITALS — BP 156/95 | Ht 69.0 in | Wt >= 6400 oz

## 2012-03-11 DIAGNOSIS — E119 Type 2 diabetes mellitus without complications: Secondary | ICD-10-CM

## 2012-03-11 NOTE — Assessment & Plan Note (Signed)
Diabetes currently under improved and excellent  control of blood glucose based on home CBG readings.  No results found for this basename: HGBA1C  Control is suboptimal due to dietary indiscretion AND exercise pattern being limited. Denies hypoglycemic events but did explain symptoms which may be low CGB.  Following review he was able to verbalize appropriate hypoglycemia management plan. Decreased dose of basal insulin Lantus (insulin glargine) from 38 units twice daily to 35 units twice daily AND . Increased dose of rapid insulin Novolog (insulin aspart) from 20 to 22 units prior to meals three times daily.  Written patient instructions provided. Follow up with Dr. Adriana Simas in August AND  Follow up in  Pharmacist Clinic Visit in September.   Total time in face to face counseling .

## 2012-03-11 NOTE — Patient Instructions (Addendum)
Change your Lantus to 35 units twice daily Change your Novolog to 22 units prior to meals three times daily.  Continue to working on increasing your exercise plan.   Please keep your plan to restart bowling next week! Keep appointment with Dr. Adriana Simas in August.  Plan for visit in Early September with pharmacist - Paulino Rily

## 2012-03-11 NOTE — Progress Notes (Signed)
  Subjective:    Patient ID: Craig [name], male    DOB: Nov 04, 1960, 51 y.o.   MRN: [id number]  HPI  Patient arrives in good spirits accompanied by Lanora Manis.   She requests original prescription for insulins AND needles and syringes for  medication assistance program. (Written by attending of the day Dr. Denny Levy). She also requested a letter for the land lord to redo the bath tub in his apartment.  (I asked them to handle this with Dr. Adriana Simas in August).   Patient brings log book of blood sugar readings 81- 150 with majority of readings 100-120.  He reports having more energy, decreased nocturia to 2 times per night.   He believes he had one episode of low blood sugar however this episode was NOT tested with his meter.   We reviewed symptoms of hypoglycemia AND management plan including a written information sheet on hypoglycemia.   At this time patient reports exercising by walking "3 benches" from his house AND has recently made it to his mailbox without stopping.  He is currently walking several days per week.  He also works out on the [employer] 10-15 minutes at a time multiple times per week.   Reports home readings of BP to be 135/72 yesterday.    Review of Systems     Objective:   Physical Exam BP 156/95  Ht 5\' 9"  (1.753 m)  Wt 418 lb 4.8 oz (189.74 kg)  BMI 61.77 kg/m2        Assessment & Plan:   Diabetes currently under improved and excellent  control of blood glucose based on home CBG readings.  No results found for this basename: HGBA1C  Control is suboptimal due to dietary indiscretion AND exercise pattern being limited. Denies hypoglycemic events but did explain symptoms which may be low CGB.  Following review he was able to verbalize appropriate hypoglycemia management plan. Decreased dose of basal insulin Lantus (insulin glargine) from 38 units twice daily to 35 units twice daily AND . Increased dose of rapid insulin Novolog (insulin aspart) from 20 to 22 units  prior to meals three times daily.  Written patient instructions provided. Follow up with Dr. Adriana Simas in August AND  Follow up in  Pharmacist Clinic Visit in September.   Total time in face to face counseling .    At this time patient was requesting refills on fluconazole AND chlorhexidene mouth rinse.  I asked him to finish his supply of both AND then try a period of time off of both of these.  With good oral hygiene and improved glycemic control I believe he may NOT need either of these.   Reassess at next visit 8/14 with Dr. Adriana Simas.

## 2012-03-11 NOTE — Progress Notes (Signed)
Patient ID: NARADA UZZLE, male   DOB: [birth year], 51 y.o.   MRN: [id number] Reviewed and agree with Dr. Macky Lower management

## 2012-03-12 ENCOUNTER — Ambulatory Visit: Payer: Self-pay | Admitting: Endocrinology

## 2012-03-13 ENCOUNTER — Telehealth: Payer: Self-pay | Admitting: Family Medicine

## 2012-03-13 NOTE — Telephone Encounter (Signed)
The Speech Therapist from [facility] is calling about Craig [name] swallowing difficulties.  She just evaled the patient and she thinks the problem is from damage to his Esophagus from his reflux.  She thinks he needs to be on medication for Reflux and then once his insurance goes through, he will need to see a GI MD.  She does not need to see him for Speech Therapy at this time.

## 2012-03-14 NOTE — Telephone Encounter (Signed)
Patient will be seen later this month in clinic.  I will assess need for reflux meds and GI eval once I see him in clinic.

## 2012-03-27 ENCOUNTER — Encounter: Payer: Self-pay | Admitting: Family Medicine

## 2012-03-27 ENCOUNTER — Ambulatory Visit (INDEPENDENT_AMBULATORY_CARE_PROVIDER_SITE_OTHER): Payer: Self-pay | Admitting: Family Medicine

## 2012-03-27 VITALS — BP 163/88 | HR 91 | Temp 97.8°F | Ht 69.0 in | Wt >= 6400 oz

## 2012-03-27 DIAGNOSIS — R42 Dizziness and giddiness: Secondary | ICD-10-CM

## 2012-03-27 DIAGNOSIS — C169 Malignant neoplasm of stomach, unspecified: Secondary | ICD-10-CM

## 2012-03-27 DIAGNOSIS — K219 Gastro-esophageal reflux disease without esophagitis: Secondary | ICD-10-CM | POA: Insufficient documentation

## 2012-03-27 DIAGNOSIS — E119 Type 2 diabetes mellitus without complications: Secondary | ICD-10-CM

## 2012-03-27 HISTORY — DX: Dizziness and giddiness: R42

## 2012-03-27 HISTORY — DX: Gastro-esophageal reflux disease without esophagitis: K21.9

## 2012-03-27 MED ORDER — OMEPRAZOLE-SODIUM BICARBONATE 40-1100 MG PO CAPS: 1.0000 | ORAL_CAPSULE | Freq: Every day | ORAL | Status: DC

## 2012-03-27 NOTE — Assessment & Plan Note (Signed)
Blood glucose has been well controlled since 8/1.  Fasting - 90-120's.  Before meals < 150. Patient currently on 35 Units of Lantus BID and 22 U of Novolog with meals.  Will continue current insulin regimen.  A1C obtained today and was 10.7.  Will recheck in 3 months. Patient also changed diet and started exercise.  I encouraged him to keep up the good work and continue to exercise.

## 2012-03-27 NOTE — Assessment & Plan Note (Signed)
Etiology unclear at this time.  Will continue to monitor closely.  Will address at follow up in 1 month.    DDX: autonomic dysfunction (from uncontrolled diabetes), orthostatic hypotension, BPPV (unlikely as not associated with head movement), visual disturbance, secondary to peripheral neuropathy

## 2012-03-27 NOTE — Progress Notes (Signed)
Subjective:     Patient ID: Craig [name], male   DOB: 1961-01-08, 51 y.o.   MRN: [id number]  HPI Mr. Kossman is a 51 year old with a history of morbid obesity and  Diabetes mellitus here today for follow up.  1. Diabetes mellitus type 2 - Patient taking 35 U Lantus BID and Novolog 22 U TID - Patients blood glucose is well controlled at this time.  Fasting blood sugars - 90's - 120's.  Patient has occasional BS in the 180-200.   - Patient has also started exercising - he is bowling 2 x a week. - He has changed his diet and is eating more fruits & veggies and bake meats.   2.  Dysphagia - Has resolved - no pain and no difficulty eating or swallowing.   - Likely secondary to GERD.  3. Cough - Patient reports cough after eating. - May be secondary to GERD. - This has improved since been on the Zegerid.  4. Obesity - Patient now exercise and diet is much improved (see above)  5. Dizziness - Patient reports that he feels "dizzy" when he stands from sitting or lying position. He states that he feels that the room is spinning. - He also reports that he feels as if he is going to fall when this happens. - Denies association with low blood sugars - Denies ringing in the ears, associated nausea/vomiting, headache - Denies association/increase with head movement.   Review of Systems Denies SOB, Chest pain, Abdominal pain.   See HPI     Objective:   Physical Exam General: well developed, well nourished. NAD. HEENT: Normal TM's. CV: RRR. No murmurs, rubs, or gallops. Resp: CTAB. No rales, rhonchi, or wheeze. Extremties: chronic stasis changes noted.    Assessment:         Plan:

## 2012-03-27 NOTE — Assessment & Plan Note (Signed)
Will get records from Oncologist.

## 2012-03-27 NOTE — Patient Instructions (Addendum)
Continue your current Insulin regimen.  I have increased your GERD medicine.    I did lab work today - A1C. I will mail you a letter or call with results.  Continue diet and exercise.

## 2012-03-27 NOTE — Assessment & Plan Note (Signed)
Patient has had recent history of dysphagia and coughing following meals.  Symptoms consistent with GERD.  Patient is being treated with Zegerid 20 mg daily.  Increased Zegerid to 40 daily given continued cough following meals.  Dysphagia has resolved.

## 2012-03-28 ENCOUNTER — Telehealth: Payer: Self-pay | Admitting: *Deleted

## 2012-03-28 NOTE — Telephone Encounter (Signed)
Patient calling to give requested info to Dr. Adriana Simas.  Patient has a "small bathroom" and unable to sit in tub.  Dr. Adriana Simas is supposed to write a letter to patient's apartment manager to change tub to shower.  Letter needs to be addressed to:  Mr. Loletta Specter, Production designer, theatre/television/film at [facility].  Will forward info to Dr. Adriana Simas.  Patient also signed release of info form at yesterday's office visit to have records sent to our office from Dr. Laural Benes.  Informed patient that I am unable to locate this doctor on the internet without a first name or office phone number.  Patient will call back later with info to complete the form.  Gaylene Brooks, RN

## 2012-03-30 ENCOUNTER — Encounter: Payer: Self-pay | Admitting: Family Medicine

## 2012-04-01 ENCOUNTER — Telehealth: Payer: Self-pay | Admitting: *Deleted

## 2012-04-01 NOTE — Telephone Encounter (Signed)
Spoke with patient and informed him that letter is ready for pick up and per his request I am mailing it to him

## 2012-04-25 ENCOUNTER — Encounter: Payer: Self-pay | Admitting: Pharmacist

## 2012-04-25 ENCOUNTER — Ambulatory Visit (INDEPENDENT_AMBULATORY_CARE_PROVIDER_SITE_OTHER): Payer: Self-pay | Admitting: Pharmacist

## 2012-04-25 VITALS — BP 151/72 | HR 78 | Ht 69.0 in | Wt >= 6400 oz

## 2012-04-25 DIAGNOSIS — K219 Gastro-esophageal reflux disease without esophagitis: Secondary | ICD-10-CM

## 2012-04-25 DIAGNOSIS — E119 Type 2 diabetes mellitus without complications: Secondary | ICD-10-CM

## 2012-04-25 MED ORDER — LIRAGLUTIDE 18 MG/3ML ~~LOC~~ SOLN: 0.6000 mg | Freq: Every day | SUBCUTANEOUS | Status: DC

## 2012-04-25 MED ORDER — OMEPRAZOLE 40 MG PO CPDR: 40.0000 mg | DELAYED_RELEASE_CAPSULE | Freq: Every day | ORAL | Status: DC

## 2012-04-25 MED ORDER — "INSULIN SYRINGE-NEEDLE U-100 27G X 5/8"" 1 ML MISC": 1.0000 | Freq: Every day | Status: DC

## 2012-04-25 MED ORDER — INSULIN ASPART 100 UNIT/ML ~~LOC~~ SOLN: 22.0000 [IU] | Freq: Three times a day (TID) | SUBCUTANEOUS | Status: DC

## 2012-04-25 NOTE — Assessment & Plan Note (Signed)
Diabetes of many yrs duration significantly improved based on home blood glucose readings. However, the last A1c reading was 10.7 (03/27/2012).  Decreased dose of basal insulin Lantus (insulin glargine) to 30 U BID from 35 U BID due to patient report of low BG readings at home when he felt dizzy. Continue Novolog 22 U TID before meals.  Encouraged patient to continue to watch his diet and increase exercise.  His goal is to stand up for at least half of a bowling game.  Informed patient that we will initiate Victoza to help curb appetite and decrease weight gain with insulin.  Will start Victoza in approximately one month once it comes in through the [facility] program.  Patient verbalized understanding.  Also sent new Rx for insulin syringes - [facility].  Written patient instructions provided.  Follow up with PCP Dr. Adriana Simas next week.  Follow up in Pharmacy Clinic once Victoza comes in through [facility].   Total time in face to face counseling 30 minutes.  Patient seen with Allene Dillon, PharmD Candidate.

## 2012-04-25 NOTE — Patient Instructions (Addendum)
Keep being active.  Your goal is to stand up for at least half of a bowling game.    Decrease your Lantus dose to 30 Units twice daily. I will send prescriptions for Novolog and Prilosec to [facility].  We will be starting you on a new drug called Victoza once [facility] gets it within the next month.  This drug may help decrease your appetite and help control your weight while you're on insulin.

## 2012-04-25 NOTE — Progress Notes (Signed)
  Subjective:    Patient ID: Craig [name], male    DOB: Feb 02, 1961, 51 y.o.   MRN: [id number]  HPI  Patient arrives in good spirits for diabetes management follow up.  He reports adherence to all medications.  His weight continues to climb despite joining a bowling league and increasing physical activity.  He is currently bowling in a league on Monday for 2.5 hours and then practices Wed and Fri for 2-3 hours each day.    He continues to urinate up to 4 times nightly.    Requested Change in Rx for Zegrid changed to Nexium or Prilosec at [facility]  40mg  Daily with 11 refills.   Review of Systems     Objective:   Physical Exam  Peripheral Edema Trace to 1+           Assessment & Plan:   Diabetes of many yrs duration significantly improved based on home blood glucose readings. However, the last A1c reading was 10.7 (03/27/2012).  Decreased dose of basal insulin Lantus (insulin glargine) to 30 U BID from 35 U BID due to patient report of low BG readings at home when he felt dizzy. Continue Novolog 22 U TID before meals.  Encouraged patient to continue to watch his diet and increase exercise.  His goal is to stand up for at least half of a bowling game.  Informed patient that we will initiate Victoza to help curb appetite and decrease weight gain with insulin.  Will start Victoza in approximately one month once it comes in through the [facility] program.  Patient verbalized understanding.  Also sent new Rx for insulin syringes - [facility].  Written patient instructions provided.  Follow up with PCP Dr. Adriana Simas next week.  Follow up in Pharmacy Clinic once Victoza comes in through [facility].   Total time in face to face counseling 30 minutes.  Patient seen with Allene Dillon, PharmD Candidate. Marland Kitchen

## 2012-04-26 NOTE — Progress Notes (Signed)
Patient ID: Craig [name], male   DOB: [birth year], 51 y.o.   MRN: [id number] Reviewed and agree with Dr. Macky Lower documentation and management.

## 2012-04-29 ENCOUNTER — Ambulatory Visit: Payer: Self-pay | Admitting: Family Medicine

## 2012-04-30 ENCOUNTER — Encounter: Payer: Self-pay | Admitting: Family Medicine

## 2012-04-30 ENCOUNTER — Ambulatory Visit (INDEPENDENT_AMBULATORY_CARE_PROVIDER_SITE_OTHER): Payer: Self-pay | Admitting: Family Medicine

## 2012-04-30 VITALS — BP 157/83 | HR 69 | Temp 98.2°F | Ht 69.0 in | Wt >= 6400 oz

## 2012-04-30 DIAGNOSIS — I10 Essential (primary) hypertension: Secondary | ICD-10-CM

## 2012-04-30 DIAGNOSIS — E119 Type 2 diabetes mellitus without complications: Secondary | ICD-10-CM

## 2012-04-30 DIAGNOSIS — E669 Obesity, unspecified: Secondary | ICD-10-CM

## 2012-04-30 DIAGNOSIS — H539 Unspecified visual disturbance: Secondary | ICD-10-CM

## 2012-04-30 DIAGNOSIS — R42 Dizziness and giddiness: Secondary | ICD-10-CM

## 2012-04-30 HISTORY — DX: Essential (primary) hypertension: I10

## 2012-04-30 MED ORDER — LISINOPRIL 10 MG PO TABS: 10.0000 mg | ORAL_TABLET | Freq: Every day | ORAL | Status: DC

## 2012-04-30 NOTE — Assessment & Plan Note (Signed)
Dizziness seems to be associated with low blood sugar readings.  Lantus decreased on 8/12 to 30 BID and Novolog decreased to 20 TID with meals today. Orthostatics obtained today were negative.  Will continue to monitor.

## 2012-04-30 NOTE — Patient Instructions (Addendum)
Keep being active. Keep bowling and going to the gym.  Continue taking Lantus 30 U.  Decrease Novolog to 20 U with each meal.  Dr. Raymondo Band sent prescription to [facility] for Nexium or Prilosec (which ever is covered).  You can go pick them up.  If you have any difficulty let me know.  I will call in a prescription for blood pressure today as well (medication is Lisinopril).  Take it once a day.  We will be starting you on a new drug called Victoza once [facility] gets it within the next month. This drug may help decrease your appetite and help control your weight while you're on insulin.   I have put in a referral for you to see an Eye doctor (ophthalmologist). You need to see an Ophthalmologist at least yearly for a Diabetic retina exam.  Schedule a follow up appointment with me in 1-2 months.  Return for lab work in 2 weeks.

## 2012-04-30 NOTE — Progress Notes (Signed)
Subjective:     Patient ID: Craig [name], male   DOB: 11-Jul-1961, 51 y.o.   MRN: [id number]  HPI [name] is a 50 year old gentlemen here today for follow up of DM-2 and dizziness. He also reports difficulty with vision.  1) DM-2 - Patient is doing well - FBS - 80-90's, Postprandial - max 130's - Patient is having several low blood sugars primarily after meals (breakfast and lunch).  They are typically in the 60's - 70's. - He is continuing to take his insulin as prescribed.  Insulin regimen recently changed by Dr. Raymondo Band (Lantus was decreased to 30 BID) - Current regimen - Lantus 30 U BID and Novolog 22 U TID with meals.  2) Dizziness - Patient reports that he continues to have dizziness  - Dizziness is associated with low blood sugar readings. - He reports that he has notice some improvement with rising slowly from lying to sitting to standing. - No reports of syncope - No other associated symptoms.  3) Vision - Patient reports that he is having difficulty seeing close objects - Recently seen by optometrist and was prescribed glasses with little improvement. - Patient has no yet seen Ophthalmologist yet do to insurance reasons (patient has [employer] card)  Review of Systems See HPI    Objective:   Physical Exam General: obese, well appearing gentlemen who appears stated age. HEENT: normocephalic, atraumatic.  Extremities: Trace - 1+ edema noted.    Assessment:      Plan:

## 2012-04-30 NOTE — Assessment & Plan Note (Signed)
Patient reports difficulty seeing objects up close. Patient has seen an optometrist, who prescribed glasses.  Patient has noticed little improvement. Given DM-2, I will refer to ophthalmology for diabetic retina exam and evaluation of decreased vision.

## 2012-04-30 NOTE — Assessment & Plan Note (Signed)
Patients blood pressure was elevated today 157/83.  Patient has been noted to be hypertensive in at least 3 prior clinic visits, prompting diagnosis of HTN. Given Diabetes, goal BP is 130/80.   Given history of dizziness, orthostatics were checked today and were negative.   Patient started on Lisinopril 10 mg today.  Will check BMP in 2 weeks to assess for hyperkalemia associated with ACEI.

## 2012-04-30 NOTE — Assessment & Plan Note (Signed)
Novolog decreased to 20 U TID with meals. Will continue Lantus 30 U BID.  Plan for repeat A1C in November 2013. Will check microalbumin/creatinine ratio with BMP in 2 weeks.

## 2012-05-03 ENCOUNTER — Other Ambulatory Visit: Payer: Self-pay | Admitting: Family Medicine

## 2012-05-03 DIAGNOSIS — E119 Type 2 diabetes mellitus without complications: Secondary | ICD-10-CM

## 2012-05-14 ENCOUNTER — Other Ambulatory Visit: Payer: Self-pay

## 2012-05-14 DIAGNOSIS — E119 Type 2 diabetes mellitus without complications: Secondary | ICD-10-CM

## 2012-05-14 DIAGNOSIS — E1169 Type 2 diabetes mellitus with other specified complication: Secondary | ICD-10-CM

## 2012-05-14 LAB — BASIC METABOLIC PANEL
BUN: 13 mg/dL (ref 6–23)
CO2: 29 mEq/L (ref 19–32)
Chloride: 99 mEq/L (ref 96–112)
Creat: 0.96 mg/dL (ref 0.50–1.35)
Glucose, Bld: 67 mg/dL — ABNORMAL LOW (ref 70–99)
Potassium: 4 mEq/L (ref 3.5–5.3)

## 2012-05-14 NOTE — Progress Notes (Unsigned)
BMP AND UA DONE TODAY Craig [name] 

## 2012-05-15 ENCOUNTER — Encounter: Payer: Self-pay | Admitting: Family Medicine

## 2012-05-16 ENCOUNTER — Telehealth: Payer: Self-pay | Admitting: Pharmacist

## 2012-05-16 DIAGNOSIS — K219 Gastro-esophageal reflux disease without esophagitis: Secondary | ICD-10-CM

## 2012-05-20 MED ORDER — OMEPRAZOLE 40 MG PO CPDR: 40.0000 mg | DELAYED_RELEASE_CAPSULE | Freq: Every day | ORAL | Status: DC

## 2012-05-20 NOTE — Telephone Encounter (Signed)
Patient requested refill for omeprazole in clinic.  Requested refill in person.   Called in to pharmacy.    Patient to follow up in Rx clinic later in the week. Reassess progress at that time.

## 2012-05-23 ENCOUNTER — Encounter: Payer: Self-pay | Admitting: Pharmacist

## 2012-05-23 ENCOUNTER — Ambulatory Visit (INDEPENDENT_AMBULATORY_CARE_PROVIDER_SITE_OTHER): Payer: Self-pay | Admitting: Pharmacist

## 2012-05-23 VITALS — Ht 69.5 in | Wt >= 6400 oz

## 2012-05-23 DIAGNOSIS — E1169 Type 2 diabetes mellitus with other specified complication: Secondary | ICD-10-CM

## 2012-05-23 DIAGNOSIS — E669 Obesity, unspecified: Secondary | ICD-10-CM

## 2012-05-23 DIAGNOSIS — E119 Type 2 diabetes mellitus without complications: Secondary | ICD-10-CM

## 2012-05-23 MED ORDER — METFORMIN HCL 500 MG PO TABS: 500.0000 mg | ORAL_TABLET | Freq: Two times a day (BID) | ORAL | Status: DC

## 2012-05-23 NOTE — Patient Instructions (Addendum)
Thank you for coming in today. Please start taking metformin 500mg  by mouth twice daily with meals. Decrease your Lantus to 28 units twice daily. Continue your Novolog 20 units three times daily. Please schedule an appointment with Dr. Adriana Simas next month and follow up with me in November.

## 2012-05-23 NOTE — Progress Notes (Signed)
  Subjective:    Patient ID: Craig [name], male    DOB: Jun 24, 1961, 51 y.o.   MRN: [id number]  HPI Patient arrives well-appearing for insulin management. He endorses increase in physical activity with bowling and improvement with food/carb portion control. He is interested in starting metformin. He provides his home CBG log and has been testing 6X/day. He is currently pending MAP benefits for [employer].     Review of Systems     Objective:   Physical Exam  CBG's high 60's to mid 100's.       Assessment & Plan:  His home fasting CBG readings improved with the majority in low 100's and random CBG readings of <200. Control is optimal but with concerning readings for potential low CBG <100.  Denies hypoglycemic events.  Able to verbalize appropriate hypoglycemia management plan. Decrease basal insulin Lantus (insulin glargine) to 28 units twice daily. Decrease prandial insulin Novolog to 20 units three times daily. Initiate metformin 500mg  PO twice daily. Patient will continue to titrate to 22 units of Novolog if prior to supper CBGs > 140mg /dl.   Written patient instructions provided.  Follow up in  Pharmacist Clinic Visit in November.  He will be following up with Dr. Adriana Simas next month and states that he will get the influenza vaccine at that time. He demonstrates understanding of appropriate insulin administration and plans for appropriate foot/skin care. Total time in face to face counseling 40 minutes.  Patient seen with Tiney Rouge, PharmD Candidate. Marland Kitchen

## 2012-05-23 NOTE — Assessment & Plan Note (Signed)
His home fasting CBG readings improved with the majority in low 100's and random CBG readings of <200. Control is optimal but with concerning readings for potential low CBG <100.  Denies hypoglycemic events.  Able to verbalize appropriate hypoglycemia management plan. Decrease basal insulin Lantus (insulin glargine) to 28 units twice daily. Decrease prandial insulin Novolog to 20 units three times daily. Initiate metformin 500mg  PO twice daily. Patient will continue to titrate to 22 units of Novolog if prior to supper CBGs > 140mg /dl.   Written patient instructions provided.  Follow up in  Pharmacist Clinic Visit in November.  He will be following up with Dr. Adriana Simas next month and states that he will get the influenza vaccine at that time. He demonstrates understanding of appropriate insulin administration and plans for appropriate foot/skin care. Total time in face to face counseling 40 minutes.  Patient seen with Tiney Rouge, PharmD Candidate.

## 2012-05-24 NOTE — Progress Notes (Signed)
Patient ID: Craig [name], male   DOB: 25-Sep-1960, 51 y.o.   MRN: [id number] Reviewed and agree with Dr. Macky Lower management and documentation.

## 2012-06-05 ENCOUNTER — Ambulatory Visit (INDEPENDENT_AMBULATORY_CARE_PROVIDER_SITE_OTHER): Payer: Self-pay | Admitting: Family Medicine

## 2012-06-05 ENCOUNTER — Encounter: Payer: Self-pay | Admitting: Family Medicine

## 2012-06-05 VITALS — BP 150/77 | HR 80 | Temp 98.7°F | Ht 69.0 in | Wt >= 6400 oz

## 2012-06-05 DIAGNOSIS — Z6841 Body Mass Index (BMI) 40.0 and over, adult: Secondary | ICD-10-CM | POA: Insufficient documentation

## 2012-06-05 DIAGNOSIS — E119 Type 2 diabetes mellitus without complications: Secondary | ICD-10-CM

## 2012-06-05 DIAGNOSIS — E1169 Type 2 diabetes mellitus with other specified complication: Secondary | ICD-10-CM

## 2012-06-05 DIAGNOSIS — I10 Essential (primary) hypertension: Secondary | ICD-10-CM

## 2012-06-05 DIAGNOSIS — K219 Gastro-esophageal reflux disease without esophagitis: Secondary | ICD-10-CM

## 2012-06-05 DIAGNOSIS — E669 Obesity, unspecified: Secondary | ICD-10-CM

## 2012-06-05 DIAGNOSIS — C169 Malignant neoplasm of stomach, unspecified: Secondary | ICD-10-CM

## 2012-06-05 HISTORY — DX: Morbid (severe) obesity due to excess calories: E66.01

## 2012-06-05 NOTE — Assessment & Plan Note (Signed)
Will continue Nexium 40 mg daily 

## 2012-06-05 NOTE — Progress Notes (Signed)
Subjective:     Patient ID: Craig [name], male   DOB: 01-Feb-1961, 51 y.o.   MRN: [id number]  CC: Follow up for DM-2, HTN, Morbid Obesity, GERD  HPI [name] is a 51 y.o. Gentlemen here today for follow up regarding DM-2, HTN, GERD, and morbid obesity.  1) DM-2 - Currently on Lantus 28 U BID, Novolog 20 U TID, and Metformin 500 mg BID - CBG's range from upper 60's- 130's.  Patient has had a few low CBG's in the 50's with symptoms.  Patient report low CBG's primarily occur with periods of activity.   - Fasting CBG's 60's - 100's most of which are somewhere in between the two extremes. - No symptoms of polyuria or polydipsia  2) HTN - Patient currently on Lisinopril 10 mg daily - Does not check BP regularly - Last 2 BP readings have been in the 150's/70-80's.  3) GERD - Patient reports that his symptoms are well controlled with Nexium 40 mg daily  4) Morbid obesity - Patient reports that he recently started walking 10-15 mins a day if he is able.  - Patient continues to Bowl 2x a week for exercise as well. - Diet appears balanced with lean meats and fruits/vegetables, however patient gaining weight.  Weight today is 437 lbs (up from 432 lbs from visit on 10/10 with pharmacy, Dr. Raymondo Band)  Review of Systems General: denies fever, chills CV: denies chest pain Respiratory: reports SOB with activity/exertion GI: denies nausea, vomiting     Objective:   Physical Exam Vital signs: Temp. 98.7 Pulse 80 BP 150/77, repeat 140/80 General: well appearing. NAD. Respiratory: normal work of breathing. Extremities: warm, well perfused. non pitting edema noted in the lower extremities.  No ulcers or lesions present. No cyanosis or clubbing noted.  Monofilament testing - negative. Sensation intact. Skin: no rashes or lesions. Neuro: AO x 3.  Sensation intact.       Assessment:         Plan:

## 2012-06-05 NOTE — Assessment & Plan Note (Signed)
Patient will need to see GI for followup/surveillance. However, patient does not have insurance.  Per staff, [facility] can set up payment plan.  Will discuss with patient and encourage him to call and set up a visit.

## 2012-06-05 NOTE — Patient Instructions (Addendum)
It was nice seeing you today.  Decrease your Lantus to 24 U twice daily.  Continue to record your readings.  Continue with your diet and continue exercising.  Try to walk 30 mins a day at least 3 times a week.    Please return for a Lipid panel (you need to be fasting) at your earliest convenience.  I would call before you come.  Please follow up with me in 3-6 months.  I am also going to refer you to a GI doctor given you history of gastric cancer.

## 2012-06-05 NOTE — Assessment & Plan Note (Addendum)
Decreased Lantus to 24 U BID given fasting CBG's are low (sometimes in the 50-60s).  Will continue Metformin 50 BID and Novolog 20 U TID. Patient expressed understanding of the treatment of hypoglycemia.  Urine Microalbumin done today as well as foot exam. Will repeat A1C next visit.

## 2012-06-05 NOTE — Assessment & Plan Note (Signed)
Patient encourage to continue current diet and increase exercise regimen.  Patient agreed to continue walking regularly (most days of the week). Will likely set patient up with Nutritionist, Dr. Gerilyn Pilgrim next visit as patient continues to gain weight.

## 2012-06-05 NOTE — Assessment & Plan Note (Signed)
Repeat BP was 140/80.  Will continue Lisinopril 10 mg daily. Will add HCTZ on next visit if patient continues to be hypertensive.

## 2012-06-07 ENCOUNTER — Other Ambulatory Visit: Payer: Self-pay | Admitting: Family Medicine

## 2012-06-07 MED ORDER — INSULIN GLARGINE 100 UNIT/ML ~~LOC~~ SOLN: 24.0000 [IU] | Freq: Two times a day (BID) | SUBCUTANEOUS | Status: DC

## 2012-06-07 MED ORDER — INSULIN PEN NEEDLE 31G X 5 MM MISC: Status: DC

## 2012-06-07 MED ORDER — INSULIN ASPART 100 UNIT/ML ~~LOC~~ SOLN: 20.0000 [IU] | Freq: Three times a day (TID) | SUBCUTANEOUS | Status: DC

## 2012-06-10 ENCOUNTER — Other Ambulatory Visit: Payer: Self-pay | Admitting: Family Medicine

## 2012-06-10 DIAGNOSIS — Z85028 Personal history of other malignant neoplasm of stomach: Secondary | ICD-10-CM

## 2012-06-11 ENCOUNTER — Other Ambulatory Visit: Payer: Self-pay

## 2012-06-11 DIAGNOSIS — E669 Obesity, unspecified: Secondary | ICD-10-CM

## 2012-06-11 LAB — LIPID PANEL
Cholesterol: 160 mg/dL (ref 0–200)
HDL: 37 mg/dL — ABNORMAL LOW (ref >39)
Total CHOL/HDL Ratio: 4.3 Ratio
VLDL: 16 mg/dL (ref 0–40)

## 2012-06-11 NOTE — Progress Notes (Signed)
FLP DONE TODAY Craig [name] 

## 2012-06-20 ENCOUNTER — Encounter: Payer: Self-pay | Admitting: Family Medicine

## 2012-06-24 ENCOUNTER — Encounter: Payer: Self-pay | Admitting: Home Health Services

## 2012-06-25 ENCOUNTER — Encounter: Payer: Self-pay | Admitting: Home Health Services

## 2012-07-30 ENCOUNTER — Ambulatory Visit: Payer: Self-pay | Admitting: Pharmacist

## 2012-08-01 ENCOUNTER — Ambulatory Visit: Payer: No Typology Code available for payment source | Admitting: Pharmacist

## 2012-08-01 ENCOUNTER — Encounter: Payer: Self-pay | Admitting: Pharmacist

## 2012-08-01 VITALS — BP 159/83 | HR 71 | Ht 69.0 in | Wt >= 6400 oz

## 2012-08-01 DIAGNOSIS — E1165 Type 2 diabetes mellitus with hyperglycemia: Secondary | ICD-10-CM

## 2012-08-01 LAB — POCT GLYCOSYLATED HEMOGLOBIN (HGB A1C): Hemoglobin A1C: 6

## 2012-08-01 MED ORDER — METFORMIN HCL 1000 MG PO TABS: 1000.0000 mg | ORAL_TABLET | Freq: Two times a day (BID) | ORAL | Status: DC

## 2012-08-01 NOTE — Progress Notes (Signed)
Patient ID: Craig [name], male   DOB: Oct 23, 1960, 51 y.o.   MRN: [id number] Reviewed: Agree with Dr. Macky Lower management and documentation.

## 2012-08-01 NOTE — Assessment & Plan Note (Addendum)
  Diabetes of many yrs duration currently under excellent control of blood glucose based on   Lab Results  Component Value Date   HGBA1C 6.0 08/01/2012    ,home fasting CBG readings of ~100 and random CBG readings of 100. Control is suboptimal due to potential for symptomatic lows if patient fails to eat.  Denies syptomatic hypoglycemic events however he has had multiple readings in the mid-70s. .  Able to verbalize appropriate hypoglycemia management plan. Decreased dose of basal insulin Lantus (insulin glargine) from 24 to 22 units twice daily. Suggest may need to further reduce this based on home fasting readings at next assessment. Increased dose of metformin to 1000mg  bid and called new Rx to [facility] [employer] Continued rapid insulin Novolog (insulin aspart) at current sliding scale dose where he is routinely using 20 units prior to most meals.   Written patient instructions provided.  Follow up in  Pharmacist Clinic Visit after next visit with Dr. Adriana Simas.   Total time in face to face counseling  35 minutes.  Patient seen with Juanita Craver, PharmD Candidate.

## 2012-08-01 NOTE — Progress Notes (Signed)
  Subjective:    Patient ID: Craig [name], male    DOB: Nov 12, 1960, 51 y.o.   MRN: [id number]  HPI Patient arrives early for visit.  He is in good spirits and walks slowly due to limitations of weight.   He reports bowling 4 games Twice weekly (3 hours of exercise at a time).   He is happy with his blood sugar readings.   He request paperwork be filled out by Dr. Adriana Simas.   Informed this would need to be done by Dr. Adriana Simas in the near future and would NOT be done today.   Review of Systems     Objective:   Physical Exam   A1C in office today = 6.0    Assessment & Plan:   Diabetes of many yrs duration currently under excellent control of blood glucose based on   Lab Results  Component Value Date   HGBA1C 6.0 08/01/2012    ,home fasting CBG readings of ~100 and random CBG readings of 100. Control is suboptimal due to potential for symptomatic lows if patient fails to eat.  Denies syptomatic hypoglycemic events however he has had multiple readings in the mid-70s. .  Able to verbalize appropriate hypoglycemia management plan. Decreased dose of basal insulin Lantus (insulin glargine) from 24 to 22 units twice daily. Suggest may need to further reduce this based on home fasting readings at next assessment.  Continued rapid insulin Novolog (insulin aspart) at current sliding scale dose where he is routinely using 20 units prior to most meals.   Written patient instructions provided.  Follow up in  Pharmacist Clinic Visit after next visit with Dr. Adriana Simas.   Total time in face to face counseling  35 minutes.  Patient seen with Juanita Craver, PharmD Candidate.

## 2012-08-01 NOTE — Patient Instructions (Addendum)
Decrease your Lantus dose to 22 units 2 times a day  Continue taking your Novolog as you have been  Start taking metformin 1000 mg twice a day  When you have knee pain, take 4 tablets of 200 mg ibuprofen (800 mg total).

## 2012-09-12 ENCOUNTER — Ambulatory Visit: Payer: No Typology Code available for payment source | Admitting: Family Medicine

## 2012-09-17 ENCOUNTER — Ambulatory Visit (INDEPENDENT_AMBULATORY_CARE_PROVIDER_SITE_OTHER): Payer: No Typology Code available for payment source | Admitting: Family Medicine

## 2012-09-17 ENCOUNTER — Encounter: Payer: Self-pay | Admitting: Family Medicine

## 2012-09-17 VITALS — BP 178/89 | HR 109 | Temp 98.9°F | Ht 69.5 in | Wt >= 6400 oz

## 2012-09-17 DIAGNOSIS — Z6841 Body Mass Index (BMI) 40.0 and over, adult: Secondary | ICD-10-CM

## 2012-09-17 DIAGNOSIS — G8929 Other chronic pain: Secondary | ICD-10-CM | POA: Insufficient documentation

## 2012-09-17 DIAGNOSIS — K219 Gastro-esophageal reflux disease without esophagitis: Secondary | ICD-10-CM

## 2012-09-17 DIAGNOSIS — E119 Type 2 diabetes mellitus without complications: Secondary | ICD-10-CM

## 2012-09-17 DIAGNOSIS — M25562 Pain in left knee: Secondary | ICD-10-CM | POA: Insufficient documentation

## 2012-09-17 DIAGNOSIS — M25569 Pain in unspecified knee: Secondary | ICD-10-CM

## 2012-09-17 DIAGNOSIS — I10 Essential (primary) hypertension: Secondary | ICD-10-CM

## 2012-09-17 DIAGNOSIS — E1140 Type 2 diabetes mellitus with diabetic neuropathy, unspecified: Secondary | ICD-10-CM | POA: Insufficient documentation

## 2012-09-17 HISTORY — DX: Type 2 diabetes mellitus without complications: E11.9

## 2012-09-17 MED ORDER — LISINOPRIL-HYDROCHLOROTHIAZIDE 20-25 MG PO TABS: 1.0000 | ORAL_TABLET | Freq: Every day | ORAL | Status: DC

## 2012-09-17 NOTE — Assessment & Plan Note (Signed)
Changed to Lisinopril/HCTZ 20-25 mg today.  Will recheck BP and BMP in 2-3 weeks.

## 2012-09-17 NOTE — Addendum Note (Signed)
Addended by: Tommie Sams on: 09/17/2012 05:22 PM   Modules accepted: Orders

## 2012-09-17 NOTE — Assessment & Plan Note (Signed)
Patient still struggling with weight loss.   Patient encouraged to continue exercising. Dr. Gerilyn Pilgrim card given to patient.  Patient instructed to call to set up an appointment.

## 2012-09-17 NOTE — Patient Instructions (Addendum)
It was good to see you again.  Continue your insulin regimen: Metformin 1000 mg BID, Novolog 20 units three times a day before meals, and Lantus 22 units twice a day. You may cut back on your Novolog to 18 units if you are going to be exercising soon after,  I have changed your blood pressure medicines:  You will now be on a single pill that contains two medicines (Lisinopril-HCTZ 20-25 mg daily)  Please continue to exercise as much as you can.  And be sure to contact our Nutritionist Dr. Gerilyn Pilgrim for an appointment.  See me in 2 weeks for follow up labs and to fill out your paperwork

## 2012-09-17 NOTE — Assessment & Plan Note (Signed)
A1C in 12/13 was 6.0. Patient instructed to continue current regimen.  Patient may decrease Novolog to 18 units if he will be exercising soon after administration.

## 2012-09-17 NOTE — Assessment & Plan Note (Signed)
Well-controlled  at this time 

## 2012-09-17 NOTE — Progress Notes (Signed)
Subjective:     Patient ID: Craig [name], male   DOB: 08/30/1960, 53 y.o.   MRN: [id number]  HPI   Mr. Schutter is a 52 y.o. Gentlemen here today for follow up regarding DM-2, HTN, GERD, and morbid obesity.   1) DM-2  - Currently on Lantus 22 U BID (recently decreased on 12/19 by Dr. Raymondo Band), Novolog 20 U TID, and Metformin 500 mg BID  - CBG's - well controlled.  Fasting 80's-110's.  Post prandials also well controlled. - ROS: No symptoms of polyuria or polydipsia.  Patient reports occasional hypoglycemia (60's) with symptoms - typically occurs in the late morning or afternoon and is associated with exercise.  2) HTN  - Patient currently on Lisinopril 10 mg daily  - Patient is compliant with medication. - Does not check BP regularly. - ROS: No headaches.  Reports improving LE edema.  3) GERD with history of gastric cancer - Patient reports that his symptoms are well controlled with Nexium 40 mg daily  - Patient did not follow up with GI.  Have yet to obtained medical records regarding history of Gastric cancer.  4) Morbid obesity  - Patient reports that he now walks up to 2 times daily for 10 mins (depending on the weather) - Patient had to stop bowling due to financial concerns. - Using [employer] approximately 3/week for 20 mins. - Diet appears balanced with lean meats and fruits/vegetables - Patient would likely benefit from Nutrition consult.   5) Bilateral Knee pain - Associated with activity - Likely secondary to osteoarthritis and weight  Review of Systems See HPI    Objective:   Physical Exam  General: well appearing, NAD. Neck: supple, no thyromegaly. Heart: RRR, no murmurs, rubs, or gallops. Lungs: CTAB. No rales, rhonchi, or wheeze. Abdomen: obese, soft, nontender. No organomegaly. Extremities: no cyanosis, clubbing, or edema. Skin: no rashes or lesions. Psych: normal mood and affect. Patient in good spirits. Neuro: no focal deficits.    Assessment:          Plan:

## 2012-09-17 NOTE — Assessment & Plan Note (Signed)
Likely secondary to osteoarthritis.  Patient instructed to use Tylenol and occasional NSAID as needed.

## 2012-10-07 ENCOUNTER — Ambulatory Visit (INDEPENDENT_AMBULATORY_CARE_PROVIDER_SITE_OTHER): Payer: Self-pay | Admitting: Family Medicine

## 2012-10-07 ENCOUNTER — Encounter: Payer: Self-pay | Admitting: Family Medicine

## 2012-10-07 VITALS — BP 146/68 | HR 80 | Temp 98.2°F | Ht 69.5 in | Wt >= 6400 oz

## 2012-10-07 DIAGNOSIS — I10 Essential (primary) hypertension: Secondary | ICD-10-CM

## 2012-10-07 DIAGNOSIS — Z6841 Body Mass Index (BMI) 40.0 and over, adult: Secondary | ICD-10-CM

## 2012-10-07 DIAGNOSIS — E119 Type 2 diabetes mellitus without complications: Secondary | ICD-10-CM

## 2012-10-07 LAB — BASIC METABOLIC PANEL
CO2: 31 mEq/L (ref 19–32)
Calcium: 9.3 mg/dL (ref 8.4–10.5)
Creat: 1.22 mg/dL (ref 0.50–1.35)
Glucose, Bld: 67 mg/dL — ABNORMAL LOW (ref 70–99)
Sodium: 137 mEq/L (ref 135–145)

## 2012-10-07 MED ORDER — OLMESARTAN MEDOXOMIL-HCTZ 40-25 MG PO TABS: 1.0000 | ORAL_TABLET | Freq: Every day | ORAL | Status: DC

## 2012-10-07 NOTE — Assessment & Plan Note (Signed)
BMP today (medication recently increased). Also, got letter from MAP which states that Benicar HCT is free to patient.  Will change antihypertensives to Benicar HCT 40-25.

## 2012-10-07 NOTE — Assessment & Plan Note (Signed)
Patient doing well.  Will continue current regimen.

## 2012-10-07 NOTE — Assessment & Plan Note (Addendum)
Patient congratulated on weight loss. Patient to see nutritionist in march. Disability paperwork reviewed and filled out with patient assistance.

## 2012-10-07 NOTE — Patient Instructions (Addendum)
You are doing very well.  Continue to take your medicines as prescribed.   You can follow up with me in 3 months or earlier as needed.  I will send in your new blood pressure medication today.  I will also fax your disability form in.

## 2012-10-07 NOTE — Progress Notes (Signed)
Subjective:     Patient ID: Craig [name], male   DOB: 06-16-1961, 52 y.o.   MRN: [id number]  HPI Craig [name] presents for follow up.   Disability paperwork to be filled out today.  Will also address Type 2 diabetes, HTN, and Morbid obesity.   1) Type 2 Diabetes - Well controlled at this time. - Last A1C was 6.0 - Currently on Metformin 1000 mg BID, Lantus 22 Units BID, and Novology 20 Units TID. - ROS: Continues to report occasional hypoglycemia.  Typically associated with physical activity.  2) HTN - Currently on Lisinopril-HCTZ  20/25. - ROS: denies cough, LE edema  3) Morbid obesity - Patient has increased physical activity (weather has improved) - Patient is down 12 pounds from last visit.  Review of Systems Denies chest pain, SOB.  For remainder of ROS see above.    Objective:   Physical Exam  Filed Vitals:   10/07/12 1051  BP: 146/68  Pulse: 80  Temp: 98.2 F (36.8 C)   General: well appearing, NAD. Psych: normal mood and affect. Neuro: no focal deficits.     Assessment:         Plan:

## 2012-10-08 ENCOUNTER — Encounter: Payer: Self-pay | Admitting: Family Medicine

## 2012-10-11 ENCOUNTER — Encounter: Payer: Self-pay | Admitting: Family Medicine

## 2012-10-17 ENCOUNTER — Encounter: Payer: Self-pay | Admitting: Family Medicine

## 2012-10-17 ENCOUNTER — Ambulatory Visit (INDEPENDENT_AMBULATORY_CARE_PROVIDER_SITE_OTHER): Payer: Self-pay | Admitting: Family Medicine

## 2012-10-17 VITALS — Ht 69.0 in | Wt >= 6400 oz

## 2012-10-17 DIAGNOSIS — E119 Type 2 diabetes mellitus without complications: Secondary | ICD-10-CM

## 2012-10-17 DIAGNOSIS — Z6841 Body Mass Index (BMI) 40.0 and over, adult: Secondary | ICD-10-CM

## 2012-10-17 NOTE — Patient Instructions (Addendum)
-   Let's think about your weight loss goals in increments:  - 415 today; next goal is to go under 400.  At that point, we'll determine the next goal.   - Keep recording your blood sugars (great job!).  If you notice an especially high or low one, stop to figure out:  - What did I last eat; what time; and how much?  Start recognizing what affects your blood sugar level in what way.    Diet Recommendations for Diabetes   Starchy (carb) foods include: Bread, rice, pasta, potatoes, corn, crackers, bagels, muffins, all baked goods.   Protein foods include: Meat, fish, poultry, eggs, dairy foods, and beans such as pinto and kidney beans (beans also provide carbohydrate).   1. Eat at least 3 meals and 1-2 snacks per day. Never go more than 4-5 hours while awake without eating.  2. Limit starchy foods to TWO per meal and ONE per snack. ONE portion of a starchy  food is equal to the following:   - ONE slice of bread (or its equivalent, such as half of a hamburger bun).   - 1/2 cup of a "scoopable" starchy food such as potatoes or rice.   - 1 OUNCE (28 grams) of starchy snack foods such as crackers or pretzels (look on label).   - 15 grams of carbohydrate as shown on food label.  3. Both lunch and dinner should include a protein food, a carb food, and vegetables.   - Obtain twice as many veg's as protein or carbohydrate foods for both lunch and dinner.   - Try to keep frozen veg's on hand for a quick vegetable serving.     - Fresh or frozen veg's are best.  4. Breakfast should always include protein.    ALSO keep up your exercise, and increase as you are able.

## 2012-10-17 NOTE — Progress Notes (Signed)
Medical Nutrition Therapy:  Appt start time: 1330 end time:  1430.  Assessment:  Primary concerns today: Weight management and Blood sugar control.  Kimble has been checking BG 6 X day, and he is recording in a log them as well.  FBG has been 80s to 113.   Usual eating pattern includes 3 meals and 3 snacks per day. Usual physical activity includes ~40 min per day:  15 min Zumba 1 X wk; 3 bowling games 2 X wk; 15-20 min total gym workout 5 X wk; 10-15 min walk outside, weather permitting; and (active) church service on Sundays.  Mr. Shevchenko has been working hard to limit carb's and portion sizes, as well as to move more.    Everyday foods include water, 1-2 Glucerna (@ 190 kcal), 0-1 Gatorade (80 kcal; 21 g sugar).  Avoided foods include most red meat, asparagus, onions.    24-hr recall: (Up at 7) B (8 AM)-   8 oz Glucerna (80 kcal) Snk ( AM)-   water L (1 PM)-  2 oz tuna on 1 slc bread (no mayo; just mustard), water Snk (2 PM)-  Nabs (220 kcal)  D (8:30 PM)-  1 [place] sandwich w/ 1 tsp mayo, cheese, tomatoes, salad w/ 1 tsp olive oil, water  Snk ( PM)-  none Other breakfasts might include whwht pancakes or [place] sausage.    Progress Towards Goal(s):  In progress.   Nutritional Diagnosis:  [place]-3.3 Overweight/obesity As related to positive energy balance.  As evidenced by BMI >60.    Intervention:  Nutrition education.  Monitoring/Evaluation:  Dietary intake, exercise, BG, and body weight in 1 month(s).

## 2012-11-12 ENCOUNTER — Telehealth: Payer: Self-pay | Admitting: *Deleted

## 2012-11-12 ENCOUNTER — Encounter: Payer: Self-pay | Admitting: Family Medicine

## 2012-11-12 ENCOUNTER — Ambulatory Visit (INDEPENDENT_AMBULATORY_CARE_PROVIDER_SITE_OTHER): Payer: No Typology Code available for payment source | Admitting: Family Medicine

## 2012-11-12 VITALS — Ht 69.0 in | Wt >= 6400 oz

## 2012-11-12 DIAGNOSIS — Z6841 Body Mass Index (BMI) 40.0 and over, adult: Secondary | ICD-10-CM

## 2012-11-12 DIAGNOSIS — E119 Type 2 diabetes mellitus without complications: Secondary | ICD-10-CM

## 2012-11-12 NOTE — Telephone Encounter (Signed)
Dr Deanne Coffer would like rx for tylenol #3 call in.please advise. Sheala Dosh, Virgel Bouquet

## 2012-11-12 NOTE — Telephone Encounter (Signed)
Patient requesting pain medication.  Unclear of why he needs this.  I will not be sending in Rx for this until patient can be seen.

## 2012-11-12 NOTE — Patient Instructions (Addendum)
-   Continue to check your blood sugars regularly.  If you have a fasting blood sugar of >130 or <80, immediately figure out what you last ate, how much, and what time; write it down.  This will help you learn what works best for you.   - Breakfast:  If you want cereal, choose one with at least 5 grams of fiber per serving.  A couple of eggs (not fried) along with fruit and/or toast will be a better breakfast for your blood sugar.  You may get best control if you include protein with EACH meal, especially at breakfast.   - Lunch & Dinner:  Aim for veg's at both meals.

## 2012-11-12 NOTE — Progress Notes (Signed)
Medical Nutrition Therapy:  Appt start time: 1000 end time:  1500.  Assessment:  Primary concerns today: Weight management and Blood sugar control.  Craig [name] is using 20 U Novalog at each meal, with 22 U Lantus at bkfst and dinner.  Craig [name] has been walking ~15 min 3 X wk, but his knees are painful.  Craig [name] is due to start in a couple of weeks.  He hopes to go 2 X wk.  Other physical activity includes workout on [employer] - 20 min 2-3 daily.  Also plans to try out a 60-min water exercise next wk.    FBG have been around low-100s.  Craig [name] is checking at least 4 X day, and recording all.  He remains extremely motivated, having lost an additional 5.8 lb in last month.    24-hr recall suggests intake of ~1360 kcal:  (UP at 10 AM) B (10 AM)-  1 1/2 c Frosted Flakes, 1 c 1% milk      250 Snk ( AM)-  water L (2:30 PM)-  (BG 56) 3 Starburst, 1 hot dog, 2 c baked beans, water (BG 95 at 4 PM) 440 Snk ( PM)-  none D (7 PM)-  (BG 88) 3 c green beans, 4 baked chx wings, 1 slc corn bread   700 Snk (12 AM)-  (BG 112 at 12 AM) diet ice cream bar (70 kcal)      70 Craig [name] said he usually severely limits carb foods unless BG is low (like at yesterday's lunch).    Progress Towards Goal(s):  In progress.   Nutritional Diagnosis:  [place]-3.3 Overweight/obesity As related to positive energy balance.  As evidenced by BMI >60.    Intervention:  Nutrition education.  Monitoring/Evaluation:  Dietary intake, exercise, BG, and body weight in 1 month(s).

## 2012-11-13 NOTE — Telephone Encounter (Signed)
Related message,patient scheduled to be seen on Friday 11/22/2012 at 1:30. Craig [name], Craig [name]

## 2012-11-22 ENCOUNTER — Ambulatory Visit (INDEPENDENT_AMBULATORY_CARE_PROVIDER_SITE_OTHER): Payer: No Typology Code available for payment source | Admitting: Family Medicine

## 2012-11-22 ENCOUNTER — Encounter: Payer: Self-pay | Admitting: Family Medicine

## 2012-11-22 VITALS — BP 141/67 | HR 84 | Ht 69.0 in | Wt >= 6400 oz

## 2012-11-22 DIAGNOSIS — Z6841 Body Mass Index (BMI) 40.0 and over, adult: Secondary | ICD-10-CM

## 2012-11-22 DIAGNOSIS — M25569 Pain in unspecified knee: Secondary | ICD-10-CM

## 2012-11-22 DIAGNOSIS — E119 Type 2 diabetes mellitus without complications: Secondary | ICD-10-CM

## 2012-11-22 DIAGNOSIS — M25561 Pain in right knee: Secondary | ICD-10-CM

## 2012-11-22 LAB — POCT GLYCOSYLATED HEMOGLOBIN (HGB A1C): Hemoglobin A1C: 6.1

## 2012-11-22 MED ORDER — IBUPROFEN 600 MG PO TABS: 600.0000 mg | ORAL_TABLET | Freq: Three times a day (TID) | ORAL | Status: DC | PRN

## 2012-11-22 NOTE — Progress Notes (Signed)
Subjective:     Patient ID: Craig [name], male   DOB: 05/12/1961, 52 y.o.   MRN: [id number]  HPI 53 year old gentlemen with HTN, Morbid obesity, and Type 2 Diabetes presents with complaints of knee pain.  1) Knee Pain - Bilateral but Left >Right - Pain moderate in severity and located at the medial aspect of knees - No other joints affected. - ROS: no fever, chills, reported warmth or swelling.  Does report some tingling but is brief and occasional.  2) Obesity - Exercising daily (walking, [employer], bowling, Zumba) ~ 40 mins - Diet: Improving portion control and increasing protein intake and vegetable intake. - Patient is losing weight and Dr. Gerilyn Pilgrim is also following.  3) DM  - Well controlled on Metformin 1000 mg BID, Lantus 22 Units BID, and Novology 20 Units TID (patient cutting back on Novolog prior to exercise to avoid hypoglycemia) - Fasting CBG's - 80's - 110's - Occasional hypoglycemia.  Associated with physical activity. - ROS: no reports of polyuria, polydipsia, visual difficulties.  Social Hx - former smoker.  Review of Systems See HPI    Objective:   Physical Exam  Filed Vitals:   11/22/12 1347  BP: 141/67  Pulse: 84   General: well appearing, NAD. Heart: RRR, no murmurs, rubs, or gallops. Lungs: CTAB. No rales, rhonchi, or wheeze. MSK: R and L Knee - Good ROM. No crepitus appreciated.  Negative anterior and posterior drawer. Negative McMurray.  R knee medial joint space tender to palpation.  Extremities: chronic venous stasis changes appreciated in both LE. 1+ Edema noted.     Assessment:        Plan:

## 2012-11-22 NOTE — Patient Instructions (Addendum)
It was good to see you today.  Feel free to increase your exercise!  Use Ibuprofen sparingly and take tylenol on a daily basis for knee pain.  Keep up the good work on losing weight.  Continue to follow up with Dr. Raymondo Band and Dr. Gerilyn Pilgrim.  I will see you back in 3-6 months.

## 2012-11-24 NOTE — Assessment & Plan Note (Addendum)
Uncontrolled at this time. Given presentation, likely secondary to Osteoarthritis and morbid obesity.  Patient urged to continue exercise and diet.  He is losing weight and is very motivated to do so. Will treat continue treatment with Tylenol.  Added PRN Ibuprofen to be used sparingly for pain (patient educated on the harms of chronic use of NSAID's - GI bleed, Gastric ulcer/peptic ulcer, Kidney injury, etc).  Educated patient on importance of losing weight as this is a cause of his knee pain. Will get standing Xray's if continues to have pain.

## 2012-11-24 NOTE — Assessment & Plan Note (Signed)
Well controlled at this time.  A1C was 6.1 today. Will continue current treatment at this time.  Patient to follow up with Dr. Raymondo Band in pharmacy clinic soon.

## 2012-11-24 NOTE — Assessment & Plan Note (Signed)
Patient is doing well and is very motivated.  Weight today was 405 lbs (down from highest recorded weight of 437). Patient is eager to increase exercise and is currently working on better portion control. Will continue to follow.

## 2012-11-28 ENCOUNTER — Ambulatory Visit (INDEPENDENT_AMBULATORY_CARE_PROVIDER_SITE_OTHER): Payer: No Typology Code available for payment source | Admitting: Pharmacist

## 2012-11-28 ENCOUNTER — Encounter: Payer: Self-pay | Admitting: Pharmacist

## 2012-11-28 VITALS — BP 116/64 | HR 85 | Ht 69.5 in | Wt >= 6400 oz

## 2012-11-28 DIAGNOSIS — E119 Type 2 diabetes mellitus without complications: Secondary | ICD-10-CM

## 2012-11-28 NOTE — Assessment & Plan Note (Signed)
Diabetes of many yrs duration currently under good control of blood glucose based on   Lab Results  Component Value Date   HGBA1C 6.1 11/22/2012    ,home fasting CBG readings of 70s-130s and random CBG readings of 60s-230. Control is suboptimal due to hypoglycemic events.  Able to verbalize appropriate hypoglycemia management plan. Decreased dose of basal insulin Lantus (insulin glargine) to 20 units twice a day. Decreased dose of rapid insulin Novolog (insulin aspart) to 16 units at breakfast, 18 units at lunch, and 18 units at dinner. Encouraged patient to continue with weight loss and encouraged a goal of 350 lbs by August. Patient to increase Zumba and walking. Written patient instructions provided.  Follow up with Dr. Adriana Simas in May and  in  Pharmacist Clinic Visit in June.   Total time in face to face counseling 25 minutes.  Patient seen with Abran Duke, PharmD Resident and Juanita Craver, PharmD candidate.

## 2012-11-28 NOTE — Progress Notes (Signed)
  Subjective:    Patient ID: Craig [name], male    DOB: Jul 18, 1961, 52 y.o.   MRN: [id number]  HPI Patient arrived in good spirits today. He brought his blood glucose meter and medications with him.  He reports about 2 hypoglycemic events a week which were also present in his log book.  Patient would like to weigh 399 lbs by the end of April, with a goal weight of 300 lbs by August 1st, 2014.   We discussed a shorter-term goal of 350 pounds.  Review of Systems     Objective:   Physical Exam        Assessment & Plan:   Diabetes of many yrs duration currently under good control of blood glucose based on   Lab Results  Component Value Date   HGBA1C 6.1 11/22/2012    ,home fasting CBG readings of 70s-130s and random CBG readings of 60s-230. Control is suboptimal due to hypoglycemic events.  Able to verbalize appropriate hypoglycemia management plan. Decreased dose of basal insulin Lantus (insulin glargine) to 20 units twice a day. Decreased dose of rapid insulin Novolog (insulin aspart) to 16 units at breakfast, 18 units at lunch, and 18 units at dinner. Encouraged patient to continue with weight loss and encouraged a goal of 350 lbs by August. Patient to increase Zumba and walking. Written patient instructions provided.  Follow up with Dr. Adriana Simas in May and  in  Pharmacist Clinic Visit in June.   Total time in face to face counseling 25 minutes.  Patient seen with Abran Duke, PharmD Resident and Juanita Craver, PharmD candidate. Marland Kitchen

## 2012-11-28 NOTE — Patient Instructions (Addendum)
It was good to see you today!  Keep working on losing weight, you are doing a great job!!  Decrease your Lantus to 20 units twice a day  Decrease your Novolog to 16 units at breakfast, 18 units at lunch, and 18 units at dinner.  Next visit with Dr. Adriana Simas

## 2012-12-02 NOTE — Progress Notes (Signed)
Patient ID: Craig [name], male   DOB: [birth year], 52 y.o.   MRN: [id number] Reviewed: Agree with Dr. Macky Lower documentation and management

## 2012-12-24 ENCOUNTER — Ambulatory Visit: Payer: No Typology Code available for payment source | Admitting: Family Medicine

## 2013-01-01 ENCOUNTER — Ambulatory Visit (INDEPENDENT_AMBULATORY_CARE_PROVIDER_SITE_OTHER): Payer: No Typology Code available for payment source | Admitting: Family Medicine

## 2013-01-01 ENCOUNTER — Encounter: Payer: Self-pay | Admitting: Family Medicine

## 2013-01-01 VITALS — BP 158/80 | HR 74 | Temp 98.1°F | Ht 69.5 in | Wt 399.7 lb

## 2013-01-01 DIAGNOSIS — M25569 Pain in unspecified knee: Secondary | ICD-10-CM

## 2013-01-01 DIAGNOSIS — E785 Hyperlipidemia, unspecified: Secondary | ICD-10-CM

## 2013-01-01 DIAGNOSIS — E1169 Type 2 diabetes mellitus with other specified complication: Secondary | ICD-10-CM | POA: Insufficient documentation

## 2013-01-01 DIAGNOSIS — M25562 Pain in left knee: Secondary | ICD-10-CM

## 2013-01-01 DIAGNOSIS — E119 Type 2 diabetes mellitus without complications: Secondary | ICD-10-CM

## 2013-01-01 DIAGNOSIS — Z6841 Body Mass Index (BMI) 40.0 and over, adult: Secondary | ICD-10-CM

## 2013-01-01 DIAGNOSIS — I10 Essential (primary) hypertension: Secondary | ICD-10-CM

## 2013-01-01 HISTORY — DX: Type 2 diabetes mellitus with other specified complication: E11.69

## 2013-01-01 MED ORDER — IBUPROFEN 800 MG PO TABS: 800.0000 mg | ORAL_TABLET | Freq: Two times a day (BID) | ORAL | Status: DC | PRN

## 2013-01-01 MED ORDER — AMLODIPINE BESYLATE 10 MG PO TABS: 10.0000 mg | ORAL_TABLET | Freq: Every day | ORAL | Status: DC

## 2013-01-01 MED ORDER — ATORVASTATIN CALCIUM 40 MG PO TABS: 40.0000 mg | ORAL_TABLET | Freq: Every day | ORAL | Status: DC

## 2013-01-01 NOTE — Assessment & Plan Note (Signed)
Patient continues to not be at goal despite two drug regimen. Amlodipine added today.

## 2013-01-01 NOTE — Assessment & Plan Note (Signed)
Patient has lost an additional 5 pounds. He is continuing his exercise regimen.  I advised him to slowly increase the amount/length of exercise. I also advised him to continue to follow up with Dr. Gerilyn Pilgrim.

## 2013-01-01 NOTE — Assessment & Plan Note (Signed)
Last LDL was 107. New lipid guidelines indicate that patient should be on high potency statin (in the setting of DM and ASCVD risk of 23.5). Will start Lipitor 40 mg daily

## 2013-01-01 NOTE — Assessment & Plan Note (Signed)
Continues to be well controlled.  Patient having hypoglycemic episodes quite frequently in the afternoon following lunch. Will decrease lunch dose novolog to 16 units.   Advised continued exercise. Patient will have retinal scan today as well.

## 2013-01-01 NOTE — Progress Notes (Signed)
Subjective:     Patient ID: Craig [name], male   DOB: [birth year], 52 y.o.   MRN: [id number]  HPI Mr. Coker presents to the clinic today for follow up regarding Obesity, HTN, and DM.  1) Knee pain - bilateral - Patient continues to have bilateral diffuse knee pain. It is worsened with activity. - Improves with motrin, which he is taking as needed. - He is not taking any other medications for this. - No reported numbness/tingling/weakness.  2) Diabetes - Disease Monitoring  Blood Sugar Ranges: 70's - 130's  Polyuria: no   Visual problems: yes;   - Medication Compliance: yes  - Medication Side Effects  Hypoglycemia: yes, typically after lunch with exercise  Preventitive Health Care  Eye Exam: In need of eye exam  Foot Exam: Complete  Exercise: Exercising twice a day (total gym, walking, bowling, Zumba)  3) HTN Home BP Monitoring - No  Chest pain- No     Dyspnea- No Medications - Benicar/HCTZ Compliance-  Yes  Lightheadedness-  Occasional   Edema- No increase in baseline   4) Morbid obesity - Patient continues to exercise regularly. - He is being followed by our Nutritionist, Dr. Gerilyn Pilgrim regarding his diet. - He is continuing to work on portion control  Review of Systems See HPI    Objective:   Physical Exam Exam: General: well appearing, NAD. Cardiovascular: RRR. No murmurs, rubs, or gallops. Respiratory: CTAB. No rales, rhonchi, or wheeze. Abdomen: obese, soft, nontender. Extremities: chronic stasis changes noted.  Trace LE edema.     Assessment:     See Prob List     Plan:

## 2013-01-01 NOTE — Patient Instructions (Addendum)
It was great to see you today.  Continue exercising and keep up the good work!  I have added an additional BP medication (Amlodipine) and a cholesterol medication (Lipitor). Take them as prescribed.  In regards to your insulin: continue taking the Lantus as prescribed.  Novolog regimen - 16, 16, 18.   I will see you back before your upcoming trip.

## 2013-01-20 ENCOUNTER — Other Ambulatory Visit: Payer: Self-pay | Admitting: *Deleted

## 2013-01-24 MED ORDER — ESOMEPRAZOLE MAGNESIUM 40 MG PO CPDR: 40.0000 mg | DELAYED_RELEASE_CAPSULE | Freq: Every day | ORAL | Status: DC

## 2013-01-30 ENCOUNTER — Other Ambulatory Visit: Payer: Self-pay | Admitting: *Deleted

## 2013-01-30 MED ORDER — ESOMEPRAZOLE MAGNESIUM 40 MG PO CPDR: 40.0000 mg | DELAYED_RELEASE_CAPSULE | Freq: Every day | ORAL | Status: DC

## 2013-03-04 ENCOUNTER — Ambulatory Visit (INDEPENDENT_AMBULATORY_CARE_PROVIDER_SITE_OTHER): Payer: No Typology Code available for payment source | Admitting: Family Medicine

## 2013-03-04 ENCOUNTER — Encounter: Payer: Self-pay | Admitting: Family Medicine

## 2013-03-04 VITALS — BP 150/70 | HR 85 | Temp 98.1°F | Ht 69.5 in | Wt >= 6400 oz

## 2013-03-04 DIAGNOSIS — E785 Hyperlipidemia, unspecified: Secondary | ICD-10-CM

## 2013-03-04 DIAGNOSIS — I10 Essential (primary) hypertension: Secondary | ICD-10-CM

## 2013-03-04 DIAGNOSIS — E119 Type 2 diabetes mellitus without complications: Secondary | ICD-10-CM

## 2013-03-04 DIAGNOSIS — Z6841 Body Mass Index (BMI) 40.0 and over, adult: Secondary | ICD-10-CM

## 2013-03-04 DIAGNOSIS — H539 Unspecified visual disturbance: Secondary | ICD-10-CM

## 2013-03-04 LAB — POCT GLYCOSYLATED HEMOGLOBIN (HGB A1C): Hemoglobin A1C: 5.7

## 2013-03-04 NOTE — Assessment & Plan Note (Signed)
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  Will send referral to Optometry/Ophthalmology so that patient's vision can be reassessed and fitted for different corrective lenses.

## 2013-03-04 NOTE — Patient Instructions (Addendum)
-   Continue to eat three meals 2-3 snacks a day.  - Continue to record your blood sugars.   - Increase the cardio exercise to at least 45 minutes per day.

## 2013-03-04 NOTE — Patient Instructions (Addendum)
It was nice seeing you today.  I will place a referral so that you can be seen by an eye doctor.  Continue your current insulin regimen.  No changes or additions to your BP regimen.  Please follow up with me in 3-6 months.

## 2013-03-04 NOTE — Progress Notes (Signed)
Subjective:     Patient ID: Craig [name], male   DOB: Jul 21, 1961, 52 y.o.   MRN: [id number]  HPI  1) Diabetes mellitus, type 2 - Disease Monitoring  Blood Sugar Ranges: 70's - 120's/130's Polyuria: no  Visual problems: yes - Medication Compliance: yes  - Medication Side Effects: No Hypoglycemia: rare hypoglycemia; with exercise  Preventitive Health Care: Eye Exam: Retinal scan complete 5/21 Foot Exam: Complete  Exercise: Exercising daily (total gym, walking, bowling, Zumba)  2) HTN Disease Monitoring: No Home BP Monitoring - not currently Chest pain- no    Dyspnea- no Medications:Benicar and Amlodipine Compliance-  yes. Lightheadedness-  occasional  Edema- no   3) Vision changes - Patient denies blurry vision.  He states that he has difficulty with up close reading.  No reported difficulties with far vision.  - He is currently using glasses. - No headaches endorsed.  No dizziness reported. No floaters.  4) Hyperlipidemia - Compliant with lipitor - No medication side effects reported.  Review of Systems Per HPI    Objective:   Physical Exam Filed Vitals:   03/04/13 1347  BP: 150/70  Pulse: 85  Temp: 98.1 F (36.7 C)   General: well appearing, NAD.  Cardiovascular: RRR. No murmurs, rubs, or gallops.  Respiratory: CTAB. No rales, rhonchi, or wheeze.  Abdomen: obese, soft, nontender.  Extremities: chronic stasis changes noted. Trace LE edema.     Assessment:       See problem list Plan:

## 2013-03-04 NOTE — Progress Notes (Signed)
Sufian will be going to [place] on Saturday for 3 days, where he will be meeting with celebrity personal trainer Annie Paras, who saw the video of Gregoire exercising.    I provided few recommendations today b/c Namon feels quite confident in his knowledge of what he needs to do; says he is eating vegetables daily, distributing his kcal between small meals and snacks 6-8 X day, and what he needs to increase is the cardio exercise, none of which he did while on vacation.  (Also ate some fried seafood on vacation, but for the most part he stuck to his diet regimen.)

## 2013-03-04 NOTE — Assessment & Plan Note (Signed)
Will continue high potency statin per new guidelines. Will repeat lipid panel to evaluate for response.  Patient to return for fasting lipid panel.

## 2013-03-04 NOTE — Assessment & Plan Note (Signed)
Well controlled - A1C 5.7 today.   Will D/C metformin. Patient instructed to continue current Insulin regimen.  If continues to have good control will titrate down insulin.

## 2013-03-04 NOTE — Progress Notes (Signed)
Medical Nutrition Therapy:  Appt start time: 1430 end time:  1530.  Assessment:  Primary concerns today: Weight management and Blood sugar control.  Craig [name] was on vacation, which is why he gained wt  Progress Towards Goal(s):  In progress.   Nutritional Diagnosis:  [place]-3.3 Overweight/obesity As related to positive energy balance.  As evidenced by BMI >60.    Intervention:  Nutrition education.  Monitoring/Evaluation:  Dietary intake, exercise, BG, and body weight in 1 month(s).

## 2013-03-04 NOTE — Assessment & Plan Note (Signed)
BP not at goal today.  Continuing Benicar/HCTZ (at max) and Amlodipine (at max). Will likely have to start additional BP medication if elevated at next visit.

## 2013-03-06 ENCOUNTER — Telehealth: Payer: Self-pay | Admitting: Family Medicine

## 2013-03-06 NOTE — Telephone Encounter (Signed)
Patients Medicaid has now gone through so he will no longer be using [facility], but [facility] [address]  He also needs a Rx for Accu - Check [employer] Meter [phone] # [id number]  And the test strip to go with it.  His Medicaid # is [id number].  He has already made [facility] aware of all of his meds.

## 2013-03-07 ENCOUNTER — Telehealth: Payer: Self-pay | Admitting: Family Medicine

## 2013-03-07 DIAGNOSIS — R739 Hyperglycemia, unspecified: Secondary | ICD-10-CM

## 2013-03-07 MED ORDER — ACCU-CHEK AVIVA DEVI: Status: AC

## 2013-03-07 MED ORDER — GLUCOSE BLOOD VI STRP: ORAL_STRIP | Status: DC

## 2013-03-07 NOTE — Telephone Encounter (Signed)
Pt needs refills for accu-check meter and the test strips be sent to [facility] pharmacy. [facility] [address] [phone] Fax [phone]. He also wants all future medications to go to this Pharmacy. He has medicaid now.JW

## 2013-03-07 NOTE — Telephone Encounter (Signed)
[facility] strip [facility] [address]

## 2013-03-07 NOTE — Telephone Encounter (Signed)
Strips refilled

## 2013-03-12 ENCOUNTER — Telehealth: Payer: Self-pay | Admitting: Family Medicine

## 2013-03-12 NOTE — Telephone Encounter (Signed)
Received [facility] authorization [facility] from [facility] for nexium.  Form placed in doctor's box for completion, attached are the preferred drugs. Will have md return form after completion to me. Wyatt Haste, RN-BSN

## 2013-03-12 NOTE — Telephone Encounter (Signed)
Patient has asked the [facility] to transfer his refills on Benicar and [facility]ium to [facility] [address], but [facility] needs [facility] to contact them and authorize that these Rx's are supposed to be transferred there.

## 2013-03-12 NOTE — Telephone Encounter (Signed)
[facility] for [facility]icar.  Form placed in doctor's box for completion, attached are the preferred drugs. Will have md return form after completion to me. Wyatt Haste, RN-BSN

## 2013-03-14 ENCOUNTER — Other Ambulatory Visit: Payer: Medicaid Other

## 2013-03-14 LAB — LIPID PANEL: Cholesterol: 116 mg/dL (ref 0–200)

## 2013-03-14 NOTE — Progress Notes (Signed)
We drew lipid. [place], Craig [name]

## 2013-03-17 ENCOUNTER — Telehealth: Payer: Self-pay | Admitting: *Deleted

## 2013-03-17 ENCOUNTER — Other Ambulatory Visit: Payer: Self-pay | Admitting: *Deleted

## 2013-03-17 ENCOUNTER — Other Ambulatory Visit: Payer: Self-pay | Admitting: Family Medicine

## 2013-03-17 MED ORDER — PANTOPRAZOLE SODIUM 40 MG PO TBEC: 40.0000 mg | DELAYED_RELEASE_TABLET | Freq: Every day | ORAL | Status: DC

## 2013-03-17 MED ORDER — LOSARTAN POTASSIUM-HCTZ 100-25 MG PO TABS: 1.0000 | ORAL_TABLET | Freq: Every day | ORAL | Status: DC

## 2013-03-17 NOTE — Telephone Encounter (Signed)
Pt notified of MD medication change.  Pt verbalized understanding.  Kaliyan Osbourn, Darlyne Russian, CMA

## 2013-03-18 ENCOUNTER — Encounter: Payer: Self-pay | Admitting: Family Medicine

## 2013-03-24 ENCOUNTER — Ambulatory Visit (INDEPENDENT_AMBULATORY_CARE_PROVIDER_SITE_OTHER): Payer: Medicaid Other | Admitting: Pharmacist

## 2013-03-24 ENCOUNTER — Encounter: Payer: Self-pay | Admitting: Pharmacist

## 2013-03-24 VITALS — BP 126/74 | HR 86 | Ht 69.0 in | Wt >= 6400 oz

## 2013-03-24 DIAGNOSIS — K219 Gastro-esophageal reflux disease without esophagitis: Secondary | ICD-10-CM

## 2013-03-24 DIAGNOSIS — E119 Type 2 diabetes mellitus without complications: Secondary | ICD-10-CM

## 2013-03-24 MED ORDER — LANSOPRAZOLE 30 MG PO CPDR: 30.0000 mg | DELAYED_RELEASE_CAPSULE | Freq: Every day | ORAL | Status: DC

## 2013-03-24 NOTE — Progress Notes (Signed)
S:    Patient arrives in usual good spirits ambulating without assistance.  He presents to the clinic for diabetes and medication follow up.  He continues to verbalize that he is working hard at his exercise regimen.  Brings in blood glucose meter (Accu-chek) for review with his log book.   He is happy with his readings however he has had multiple readings < 70 requiring hypoglycemia management.   O:  CBG report demonstrates majority of readings < 130 and many readings< 100.  Three readings <70.   Minimal - Pretibial edema (patent reports more later in the day)  A/P: Diabetes of many years duration currently with . Lab Results  Component Value Date   HGBA1C 5.7 03/04/2013   AND home fasting CBG readings of < 130 and 2 hour post-prandial/random CBG readings of 70-120.  Reports hypoglycemic events and is able to verbalize appropriate hypoglycemia management plan.  Reports adherence with medication. Patient is meeting goals for blood glucose control at this time however he has experienced multiple hypoglycemic episodes and admits that rescue tx for hypoglycemia is making it hard for him to lose weight.   Control is suboptimal due to increased exercise and higher level of insulin sensitity. Decreased dose of basal insulin Lantus (insulin glargine) - reduced from 20 to 18 units BID. AND   Decreased dose of Novolog to 14, 14, and 16 units prior to meals.   Patient aware of our short term goal of avoiding readings consistently < 120.   We will be trying to lose weight by avoiding hypoglycemia rescue tx calories.     Change in taste possibly related to PPI use - change to alternative PAL drug in PPI class - lansoprazole.  Reassess at next visit.  Written patient instructions provided.  Follow up in Pharmacist Clinic Visit PRN     Discussed need for elevation, exercise, weight loss and compression for lower extremity swelling.  Total time in face to face counseling 30 minutes.  Patient seen with Georga Kaufmann, PharmD Resident

## 2013-03-24 NOTE — Assessment & Plan Note (Signed)
Change in taste possibly related to PPI use - change to alternative PAL drug in PPI class - lansoprazole.  Reassess at next visit.  Written patient instructions provided.  Follow up in Pharmacist Clinic Visit PRN

## 2013-03-24 NOTE — Assessment & Plan Note (Signed)
Diabetes of many years duration currently with . Lab Results  Component Value Date   HGBA1C 5.7 03/04/2013   AND home fasting CBG readings of < 130 and 2 hour post-prandial/random CBG readings of 70-120.  Reports hypoglycemic events and is able to verbalize appropriate hypoglycemia management plan.  Reports adherence with medication. Patient is meeting goals for blood glucose control at this time however he has experienced multiple hypoglycemic episodes and admits that rescue tx for hypoglycemia is making it hard for him to lose weight.   Control is suboptimal due to increased exercise and higher level of insulin sensitity. Decreased dose of basal insulin Lantus (insulin glargine) - reduced from 20 to 18 units BID. AND   Decreased dose of Novolog to 14, 14, and 16 units prior to meals.   Patient aware of our short term goal of avoiding readings consistently < 120.   We will be trying to lose weight by avoiding hypoglycemia rescue tx calories.       Discussed need for elevation, exercise, weight loss and compression for lower extremity swelling.  Total time in face to face counseling 30 minutes.  Patient seen with Georga Kaufmann, PharmD Resident

## 2013-03-24 NOTE — Patient Instructions (Signed)
Thanks for coming in today!  We changed your Novolog to 14 units before breakfast, 14 units before lunch, and 16 units before dinner.  We also changed your Lantus to 18 units twice a day.   Next visit with Dr. Adriana Simas in 2 weeks.

## 2013-03-25 NOTE — Progress Notes (Signed)
Patient ID: Craig [name], male   DOB: Jun 19, 1961, 52 y.o.   MRN: [id number] Reviewed: Agree with Dr. Macky Lower documentation and management.

## 2013-04-01 NOTE — Telephone Encounter (Signed)
Yes med was changed Craig Haste, RN-BSN

## 2013-04-01 NOTE — Telephone Encounter (Signed)
Has this been addressed?

## 2013-04-21 ENCOUNTER — Ambulatory Visit: Payer: No Typology Code available for payment source | Admitting: Family Medicine

## 2013-04-28 ENCOUNTER — Ambulatory Visit: Payer: Self-pay | Admitting: Family Medicine

## 2013-05-07 ENCOUNTER — Other Ambulatory Visit: Payer: Self-pay | Admitting: Family Medicine

## 2013-05-09 ENCOUNTER — Other Ambulatory Visit: Payer: Self-pay | Admitting: Family Medicine

## 2013-05-13 ENCOUNTER — Ambulatory Visit (INDEPENDENT_AMBULATORY_CARE_PROVIDER_SITE_OTHER): Payer: Medicaid Other | Admitting: Family Medicine

## 2013-05-13 ENCOUNTER — Encounter: Payer: Self-pay | Admitting: Family Medicine

## 2013-05-13 VITALS — BP 158/84 | HR 94 | Temp 98.9°F | Ht 69.0 in | Wt 396.0 lb

## 2013-05-13 DIAGNOSIS — I10 Essential (primary) hypertension: Secondary | ICD-10-CM

## 2013-05-13 DIAGNOSIS — E119 Type 2 diabetes mellitus without complications: Secondary | ICD-10-CM

## 2013-05-13 DIAGNOSIS — Z6841 Body Mass Index (BMI) 40.0 and over, adult: Secondary | ICD-10-CM

## 2013-05-13 DIAGNOSIS — K219 Gastro-esophageal reflux disease without esophagitis: Secondary | ICD-10-CM

## 2013-05-13 MED ORDER — CARVEDILOL 6.25 MG PO TABS: 6.2500 mg | ORAL_TABLET | Freq: Two times a day (BID) | ORAL | Status: DC

## 2013-05-13 MED ORDER — OMEPRAZOLE 40 MG PO CPDR: 40.0000 mg | DELAYED_RELEASE_CAPSULE | Freq: Every day | ORAL | Status: DC

## 2013-05-13 MED ORDER — INSULIN PEN NEEDLE 31G X 5 MM MISC: Status: DC

## 2013-05-13 MED ORDER — LANSOPRAZOLE 15 MG PO CPDR: 30.0000 mg | DELAYED_RELEASE_CAPSULE | Freq: Every day | ORAL | Status: DC

## 2013-05-13 NOTE — Patient Instructions (Addendum)
It was good to see you today.  Below is what we talked about today and the instructions.  1) Weight - Please continue exercising regularly. - Follow up with Dr. Gerilyn Pilgrim.  2) BP - I have added 1 additional medication for you blood pressure - Coreg.  3) Diabetes - Your diabetes is well controlled! - Keep up the good work.

## 2013-05-13 NOTE — Assessment & Plan Note (Signed)
Not controlled. Added Coreg 6.25 mg BID today.

## 2013-05-13 NOTE — Assessment & Plan Note (Signed)
Patient informed me that this was not covered by insurance. Rx sent in for Omeprazole.

## 2013-05-13 NOTE — Assessment & Plan Note (Signed)
Counseled patient about diet and daily exercise. Patient is to follow up with Dr. Gerilyn Pilgrim regarding diet.

## 2013-05-13 NOTE — Progress Notes (Signed)
Subjective:     Patient ID: Craig [name], male   DOB: [birth year], 52 y.o.   MRN: [id number]  HPI Jayron is a 52 year old [place] American male who presents for follow up.  1) Morbid obesity - He continues to struggle with his weight. - Has not been as active as in the past recently. - Patient has not followed up with Nutritionist Dr. Gerilyn Pilgrim since initial visit on 03/04/13  2) DM-2 - Well controlled. Lauro Manlove states that his blood sugars are well controlled. <130.  - Patient has had no hypoglycemic events since Dr. Raymondo Band decreased insulin. - No polyuria, polydipsia, visual difficulties.  Occasional dizziness.  3) HTN - He is compliant with current medication regimen - No reported side effects. - No chest pain, SOB, worsening LE edema.   History   Social History  . Marital Status: Single    Spouse Name: N/A    Number of Children: N/A  . Years of Education: N/A   Social History Main Topics  . Smoking status: Former Smoker    Start date: 08/14/1977    Quit date: 08/14/1996  . Smokeless tobacco: Never Used  . Alcohol Use: No  . Drug Use: Yes    Special: Cocaine, Marijuana, Heroin     Comment: Previous Hx of substance abuse  . Sexual Activity: Yes   Other Topics Concern  . None   Social History Narrative  . None   Review of Systems  Constitutional: Negative for fever and unexpected weight change.  Respiratory: Negative for chest tightness and shortness of breath.   Cardiovascular: Negative for chest pain.  Gastrointestinal: Negative for abdominal pain.      Objective:   Physical Exam Filed Vitals:   05/13/13 0905  BP: 158/84  Pulse: 94  Temp: 98.9 F (37.2 C)  General: morbidly obese male in NAD.  Cardiovascular: RRR. No murmurs, rubs, or gallops.  Respiratory: CTAB. No rales, rhonchi, or wheeze.  Abdomen: obese, soft, nontender.  Extremities: chronic stasis changes noted. Trace LE edema    Assessment:     See Problem List    Plan:

## 2013-05-13 NOTE — Assessment & Plan Note (Signed)
Well controlled. A1C 5.7 today. No changes made today.  Will discuss regimen with Dr. Raymondo Band and consider further decrease in insulin therapy.

## 2013-06-10 ENCOUNTER — Other Ambulatory Visit: Payer: Self-pay | Admitting: Family Medicine

## 2013-07-04 ENCOUNTER — Telehealth: Payer: Self-pay | Admitting: Family Medicine

## 2013-07-04 NOTE — Telephone Encounter (Signed)
Placed papers in your box to be filled out for [employer].  Received 2nd request for these papers this week.   Please give to Payton Doughty when completed so I can fax back to them.  Thanks!

## 2013-07-04 NOTE — Telephone Encounter (Signed)
Patient needs to come in so that I can fill out these properly.

## 2013-07-12 ENCOUNTER — Other Ambulatory Visit: Payer: Self-pay | Admitting: Family Medicine

## 2013-07-15 ENCOUNTER — Telehealth: Payer: Self-pay | Admitting: *Deleted

## 2013-07-15 NOTE — Telephone Encounter (Signed)
Prior authorization form for Losartan-Hctz (along with Medicaid preferred drug list) placed in MD box, please return to [place] or [place] when complete.

## 2013-07-15 NOTE — Telephone Encounter (Signed)
Could not find form in my box?

## 2013-07-17 MED ORDER — LISINOPRIL-HYDROCHLOROTHIAZIDE 20-25 MG PO TABS: 1.0000 | ORAL_TABLET | Freq: Every day | ORAL | Status: DC

## 2013-07-17 NOTE — Addendum Note (Signed)
Addended by: Tommie Sams on: 07/17/2013 01:22 PM   Modules accepted: Orders, Medications

## 2013-07-17 NOTE — Telephone Encounter (Signed)
Related message,patient voiced understanding. Dymin Dingledine S  

## 2013-07-17 NOTE — Telephone Encounter (Signed)
Patient has been on ACEI in the past. We switched to Losartan-HCTZ because it was covered (free) from [employer]. Losartan-HCTZ now D/C'd. Rx sent in for Lisinopril HCTZ

## 2013-07-24 ENCOUNTER — Encounter: Payer: Self-pay | Admitting: Pharmacist

## 2013-07-24 ENCOUNTER — Ambulatory Visit (INDEPENDENT_AMBULATORY_CARE_PROVIDER_SITE_OTHER): Payer: Medicaid Other | Admitting: Pharmacist

## 2013-07-24 VITALS — BP 132/81 | HR 60 | Ht 69.0 in | Wt >= 6400 oz

## 2013-07-24 DIAGNOSIS — IMO0001 Reserved for inherently not codable concepts without codable children: Secondary | ICD-10-CM

## 2013-07-24 DIAGNOSIS — E119 Type 2 diabetes mellitus without complications: Secondary | ICD-10-CM

## 2013-07-24 DIAGNOSIS — IMO0002 Reserved for concepts with insufficient information to code with codable children: Secondary | ICD-10-CM

## 2013-07-24 DIAGNOSIS — E1165 Type 2 diabetes mellitus with hyperglycemia: Secondary | ICD-10-CM

## 2013-07-24 MED ORDER — INSULIN GLARGINE 100 UNIT/ML SOLOSTAR PEN: 14.0000 [IU] | PEN_INJECTOR | Freq: Two times a day (BID) | SUBCUTANEOUS | Status: DC

## 2013-07-24 MED ORDER — METFORMIN HCL 1000 MG PO TABS: 1000.0000 mg | ORAL_TABLET | Freq: Two times a day (BID) | ORAL | Status: DC

## 2013-07-24 MED ORDER — INSULIN ASPART 100 UNIT/ML ~~LOC~~ SOLN: SUBCUTANEOUS | Status: DC

## 2013-07-24 NOTE — Patient Instructions (Signed)
Decrease your Lantus to 14 units twice daily   Decrease your Novolog to 12, 12 and 16 prior to meals.   Start Victoza 0.6 once daily at lunch for 1 week.   Increase Victoza to 1.2mg  for next week.   Finally, Inject Victoza 1.8mg  once daly at lunch as the long-term dose.   Call with Questions if you have any.   Keep working hard at eating less AND eating healthy.

## 2013-07-24 NOTE — Assessment & Plan Note (Signed)
Diabetes currently doing well based on CBG and A1c evaluation.  Denies hypoglycemic events and is able to verbalize appropriate hypoglycemia management plan.  Reports adherence with medication.  Patient is meeting goals for blood glucose control at this time however, control is suboptimal due to inability to achieve any weight loss.  Initiated a trial of Victoza (liraglutide) to evaluate if we can maintain glycemic control in combination with weight loss. He was provided with instruction on reduces insulin regimen at this time.   Lantus was now 14units BID and Novolog was reduced to 12, 12, and 16 units with his three meals daily.   He was congratulated on his success with cutting down on fried food to ~ 1 x per month.   Written patient instructions provided.  Anticipate follow up in Pharmacist Clinic Visit  In early January.  Total time in face to face counseling 35 minutes.

## 2013-07-24 NOTE — Progress Notes (Signed)
S:    Patient arrives in great mood.    Presents for diabetes for follow up and is interested in starting medicine he has previously picked up from [facility] (Vicotza).  Patient reports doing well with blood glucose readings.   He has been sick with a cold recently.   He admits failing to do well with diet and exercise plan.      O:  . Lab Results  Component Value Date   HGBA1C 5.7 05/13/2013     home fasting CBG readings of 72-130 2 hour post-prandial/random CBG readings of 100-140  A/P: Diabetes currently doing well based on CBG and A1c evaluation.  Denies hypoglycemic events and is able to verbalize appropriate hypoglycemia management plan.  Reports adherence with medication. However, he was taking BOTH doses of carvedilol at the same time (he was advised to split these) AND he was taking omeprazole, lansoprazole AND pantoprazole at the same time (clarified via phone call with local pharmacy of preference to continue omeprazole only). Patient is meeting goals for blood glucose control at this time however, control is suboptimal due to inability to achieve any weight loss.  Initiated a trial of Victoza (liraglutide) to evaluate if we can maintain glycemic control in combination with weight loss. He was provided with instruction on reduces insulin regimen at this time.   Lantus was now 14units BID and Novolog was reduced to 12, 12, and 16 units with his three meals daily.   He was congratulated on his success with cutting down on fried food to ~ 1 x per month.   Written patient instructions provided.  Anticipate follow up in Pharmacist Clinic Visit  In early January.  Total time in face to face counseling 35 minutes.

## 2013-07-24 NOTE — Progress Notes (Signed)
Patient ID: Craig [name], male   DOB: 1961-01-24, 52 y.o.   MRN: [id number] Reviewed: Agree with Dr. Macky Lower management and documentation.

## 2013-07-31 ENCOUNTER — Ambulatory Visit: Payer: Medicaid Other | Admitting: Family Medicine

## 2013-08-11 ENCOUNTER — Telehealth: Payer: Self-pay | Admitting: Family Medicine

## 2013-08-11 DIAGNOSIS — E1165 Type 2 diabetes mellitus with hyperglycemia: Secondary | ICD-10-CM

## 2013-08-11 MED ORDER — METFORMIN HCL 1000 MG PO TABS: 1000.0000 mg | ORAL_TABLET | Freq: Two times a day (BID) | ORAL | Status: DC

## 2013-08-11 NOTE — Telephone Encounter (Signed)
[facility] his [facility]. Can we send this again. jw

## 2013-08-11 NOTE — Telephone Encounter (Signed)
Spoke with patient and informed him that I resent the rx for metformin due to it being a phone in and not normal.

## 2013-08-12 ENCOUNTER — Other Ambulatory Visit: Payer: Self-pay | Admitting: Family Medicine

## 2013-08-18 ENCOUNTER — Ambulatory Visit: Payer: Medicaid Other | Admitting: Family Medicine

## 2013-08-27 ENCOUNTER — Ambulatory Visit (INDEPENDENT_AMBULATORY_CARE_PROVIDER_SITE_OTHER): Payer: Medicaid Other | Admitting: Family Medicine

## 2013-08-27 ENCOUNTER — Encounter: Payer: Self-pay | Admitting: Family Medicine

## 2013-08-27 VITALS — BP 131/84 | HR 58 | Temp 99.2°F | Ht 69.0 in | Wt 386.0 lb

## 2013-08-27 DIAGNOSIS — Z6841 Body Mass Index (BMI) 40.0 and over, adult: Secondary | ICD-10-CM

## 2013-08-27 DIAGNOSIS — E119 Type 2 diabetes mellitus without complications: Secondary | ICD-10-CM

## 2013-08-27 DIAGNOSIS — R112 Nausea with vomiting, unspecified: Secondary | ICD-10-CM

## 2013-08-27 DIAGNOSIS — I10 Essential (primary) hypertension: Secondary | ICD-10-CM

## 2013-08-27 LAB — BASIC METABOLIC PANEL
BUN: 49 mg/dL — ABNORMAL HIGH (ref 6–23)
CHLORIDE: 103 meq/L (ref 96–112)
CO2: 27 meq/L (ref 19–32)
Calcium: 9.1 mg/dL (ref 8.4–10.5)
Creat: 2.79 mg/dL — ABNORMAL HIGH (ref 0.50–1.35)
Glucose, Bld: 89 mg/dL (ref 70–99)
POTASSIUM: 5.4 meq/L — AB (ref 3.5–5.3)
Sodium: 135 mEq/L (ref 135–145)

## 2013-08-27 LAB — POCT GLYCOSYLATED HEMOGLOBIN (HGB A1C): HEMOGLOBIN A1C: 5.8

## 2013-08-27 MED ORDER — ASPIRIN EC 81 MG PO TBEC: 81.0000 mg | DELAYED_RELEASE_TABLET | Freq: Every day | ORAL | Status: DC

## 2013-08-27 MED ORDER — LOSARTAN POTASSIUM-HCTZ 100-25 MG PO TABS: 1.0000 | ORAL_TABLET | Freq: Every day | ORAL | Status: DC

## 2013-08-27 NOTE — Assessment & Plan Note (Signed)
Patient has lost 27 lbs in 1 month. Likely secondary to anorexia, nausea, vomiting from Victoza. Victoza D/C'd today. Advised continued regular exercise and well balanced diet.

## 2013-08-27 NOTE — Assessment & Plan Note (Signed)
Secondary to Victoza. Will stop today.

## 2013-08-27 NOTE — Patient Instructions (Signed)
It was nice to see you today. Instructions are below.  1) Stop taking the victoza.  2) I am obtaining blood work today since you have been taking both the Lisinopril and the Losartan. - Be sure to take just the Losartan. Discard the other.  3) Be sure to continue taking the protonix.  Do not take protonix and omeprazole together.   4) Decrease the Lantus to 16 BID.  You can also decrease the evening Novolog to 14 units.  Follow up in 3 months.  Continue regular exercise and proper diet.

## 2013-08-27 NOTE — Assessment & Plan Note (Signed)
Well controlled. Patient has been mistakingly taking both Lisinopril/HCTZ and Losartan/HCTZ. I clarified this today and advised taking just the Losartan/HCTZ. Will obtain BMP to evaluate creatinine due to above.

## 2013-08-27 NOTE — Assessment & Plan Note (Signed)
Well controlled. A1C 5.8. Decreased Lantus to 16 BID and evening novolog to 16 units.

## 2013-08-27 NOTE — Progress Notes (Signed)
Subjective:     Patient ID: Craig [name], male   DOB: 06-06-1961, 53 y.o.   MRN: [id number]  HPI 53 year old male with HTN, HLD, DM-2 and Morbid obesity presents for follow up.  1) Nausea/Vomiting secondary to recent use of Victoza - Patient has been experiencing severe anorexia, nausea, and vomiting since starting Victoza (per Pharmacy) on 12/11. - Emesis was non-bloody and non-bilious. - He has recently had to stop the medication secondary to this.   2) Diabetes mellitus, type 2  Disease Monitoring  Blood Sugar Ranges: Well controlled; 70's - 120's fasting.  Polyuria: no   Visual problems: yes   Medication Compliance: yes; Metformin 1000 mg BID. Novolog 07/26/15, Lantus 18 BID Medication Side Effects  Hypoglycemia: Has had a few hypoglycemic episodes with proper correction.  3) Morbid obesity - Patient continues to struggle with weight loss - Patient has loss weight secondary to above (#1)  4) HTN Disease Monitoring: Home BP Monitoring - No Chest pain- No    Dyspnea- No Lightheadedness-  Yes  Medications:Patient has been confused about his medications and has been taking both ARB and ACEI.   Review of Systems Per HPI    Objective:   Physical Exam Filed Vitals:   08/27/13 1342  BP: 131/84  Pulse: 58  Temp: 99.2 F (37.3 C)   Exam: General: well appearing, pleasant obese male in NAD.  Cardiovascular: RRR. No murmurs, rubs, or gallops. Respiratory: CTAB. No rales, rhonchi, or wheeze. Abdomen: obese, soft, nontender.  Extremities: Trace LE edema with chronic LE skin changes.    Assessment/Plan:   See Problem List

## 2013-08-28 ENCOUNTER — Telehealth: Payer: Self-pay | Admitting: *Deleted

## 2013-08-28 NOTE — Telephone Encounter (Signed)
Spoke with patient and informed him of below 

## 2013-08-28 NOTE — Telephone Encounter (Signed)
Message copied by [name] on Thu Aug 28, 2013  3:02 PM ------      Message from: [name]      Created: Thu Aug 28, 2013 11:34 AM       Please call and tell Craig [name] to hold his Losartan/HCTZ.  His lab results showed elevated creatinine (due to him taking both Lisinopril and Losartan at the same time).              He needs to increase his fluid intake and follow up in 1 week for a BMP.            Thanks            [name] ------

## 2013-09-02 ENCOUNTER — Other Ambulatory Visit: Payer: Medicaid Other

## 2013-09-02 DIAGNOSIS — I10 Essential (primary) hypertension: Secondary | ICD-10-CM

## 2013-09-02 DIAGNOSIS — E119 Type 2 diabetes mellitus without complications: Secondary | ICD-10-CM

## 2013-09-02 LAB — BASIC METABOLIC PANEL
BUN: 13 mg/dL (ref 6–23)
CO2: 31 mEq/L (ref 19–32)
CREATININE: 1.17 mg/dL (ref 0.50–1.35)
Calcium: 8.9 mg/dL (ref 8.4–10.5)
Chloride: 101 mEq/L (ref 96–112)
Glucose, Bld: 73 mg/dL (ref 70–99)
Potassium: 4.2 mEq/L (ref 3.5–5.3)
Sodium: 135 mEq/L (ref 135–145)

## 2013-09-02 NOTE — Progress Notes (Signed)
BMP DONE TODAY Bethanny Toelle 

## 2013-09-04 NOTE — Telephone Encounter (Signed)
Message copied by Corinna Capra on Thu Sep 04, 2013 11:17 AM ------      Message from: [name]      Created: Wed Sep 03, 2013  9:02 PM       Patient's kidneys have improved.      Please inform him of this and have him restart his Losartan.      Thanks            [name] ------

## 2013-09-04 NOTE — Telephone Encounter (Signed)
Related message,patient voiced understanding. Craig [name], Craig [name]

## 2013-09-08 ENCOUNTER — Other Ambulatory Visit: Payer: Self-pay | Admitting: Family Medicine

## 2013-09-08 ENCOUNTER — Ambulatory Visit: Payer: Medicaid Other | Admitting: Family Medicine

## 2013-10-08 ENCOUNTER — Other Ambulatory Visit: Payer: Self-pay | Admitting: Family Medicine

## 2013-11-01 IMAGING — CR DG CHEST 2V
2 series · 2 of 2 positions shown · non-contrast
Comparison: None

CLINICAL DATA: Leg swelling, nausea.

CHEST - 2 VIEW

[w chest lat]
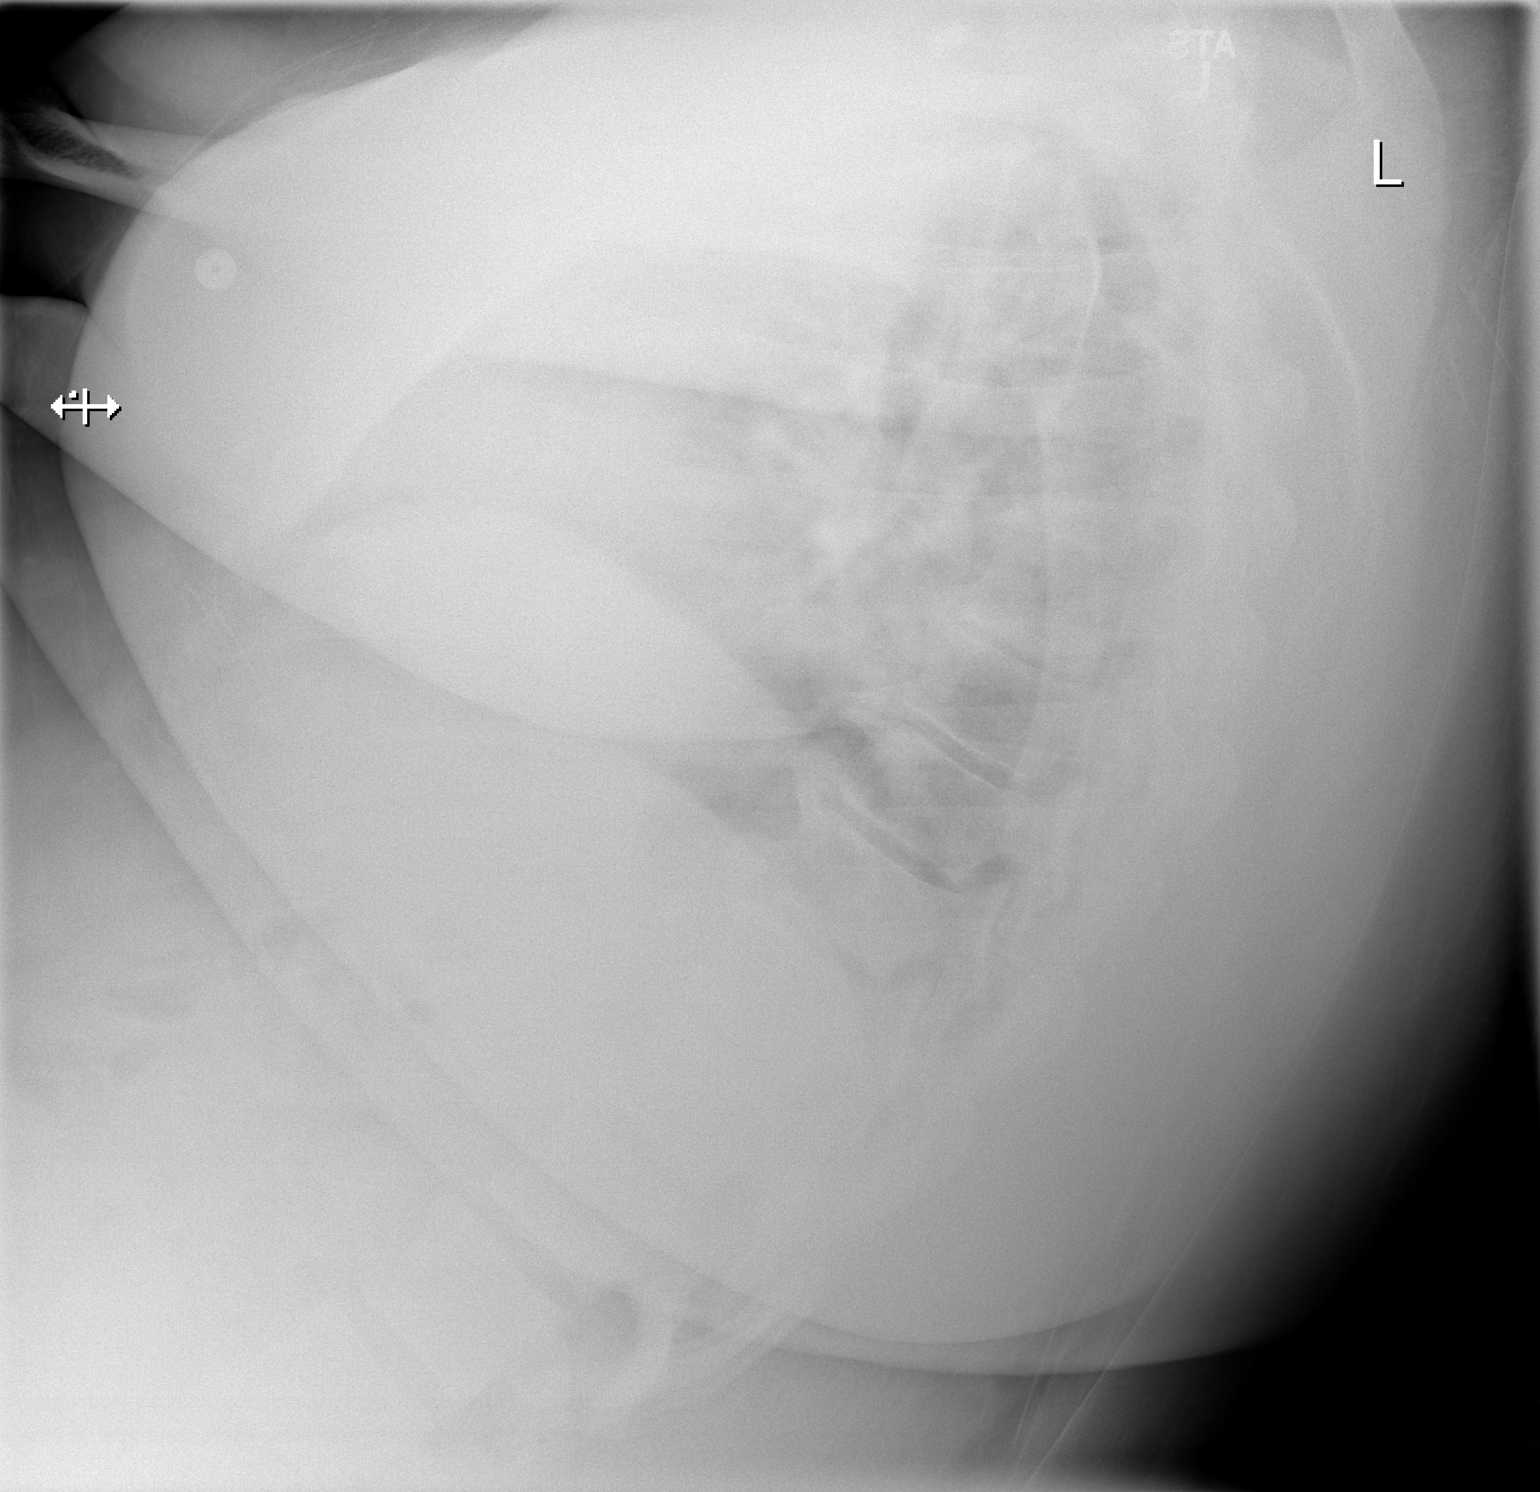

[x chest ap]
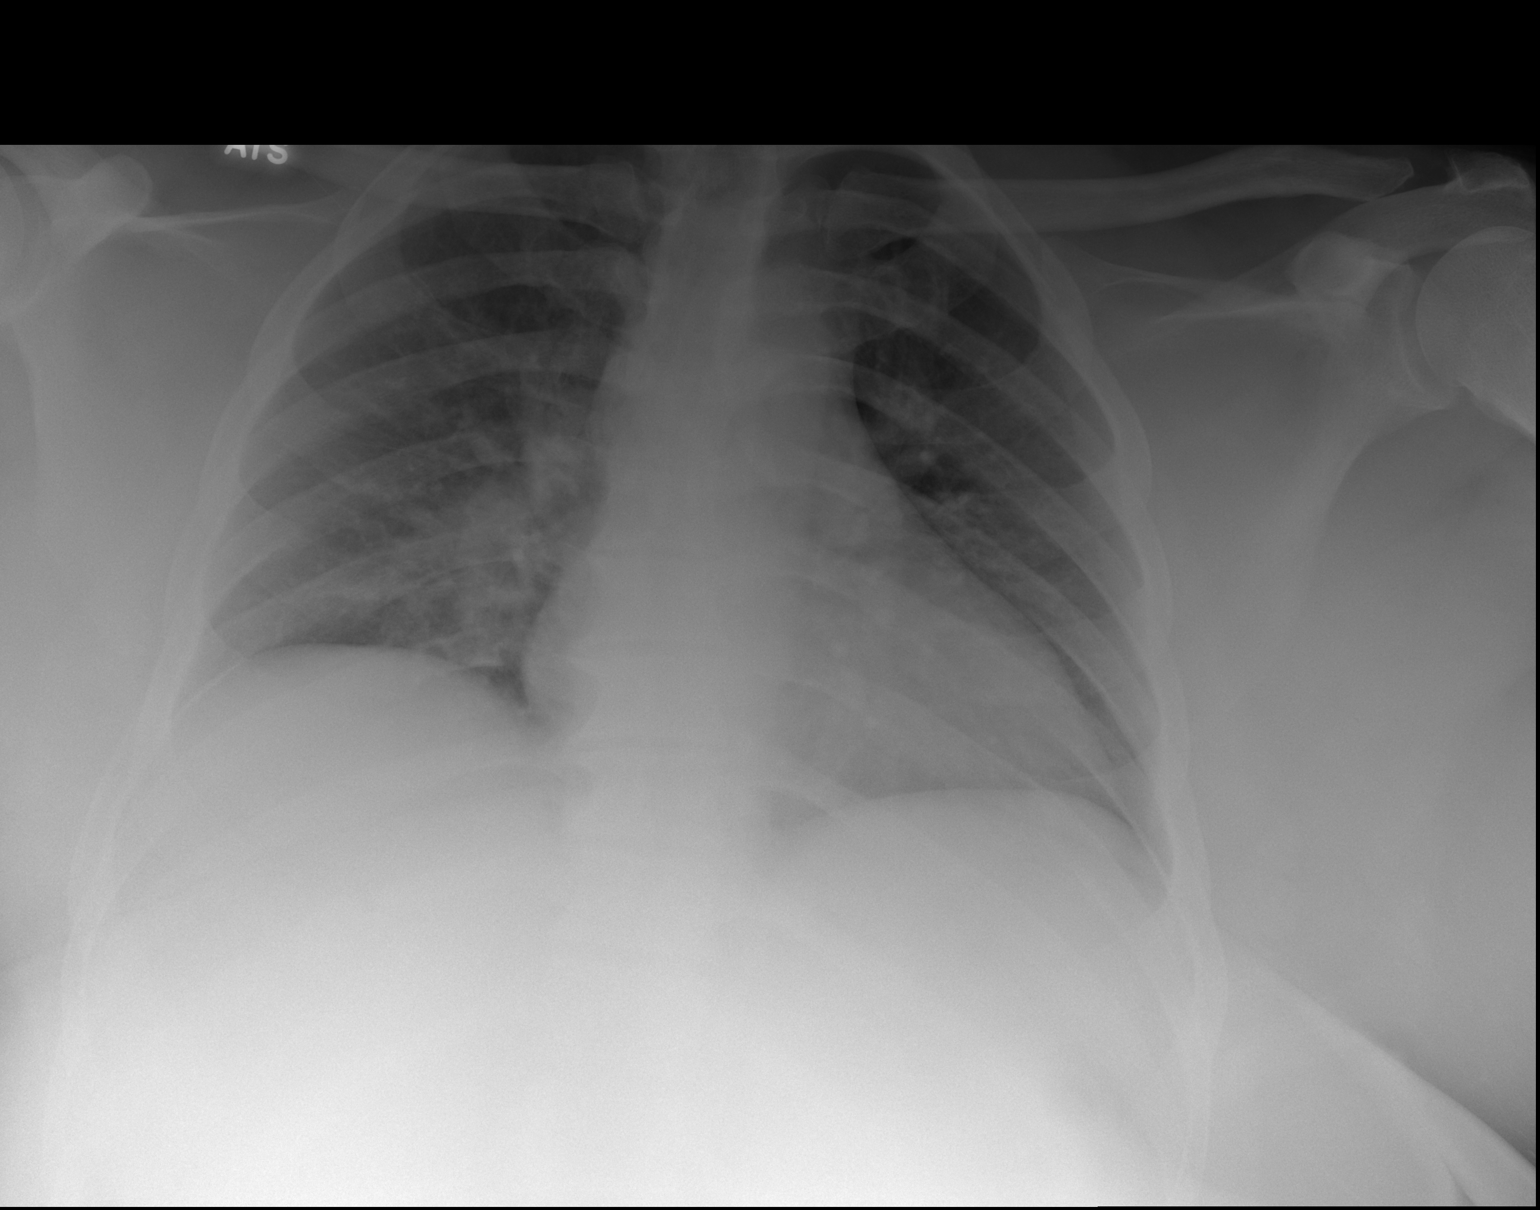

[2 of 2 positions shown; findings below may reference images not displayed]

FINDINGS: Heart is borderline in size.  There are low lung volumes
with vascular congestion and bibasilar atelectasis.  No overt
edema.  No effusions or acute bony abnormality.
IMPRESSION: Cardiomegaly, vascular congestion.  Low lung volumes with bibasilar
atelectasis.

## 2013-11-05 ENCOUNTER — Ambulatory Visit (INDEPENDENT_AMBULATORY_CARE_PROVIDER_SITE_OTHER): Payer: Medicaid Other | Admitting: Family Medicine

## 2013-11-05 ENCOUNTER — Encounter: Payer: Self-pay | Admitting: Family Medicine

## 2013-11-05 VITALS — BP 121/70 | HR 68 | Temp 97.9°F | Ht 69.0 in | Wt 395.6 lb

## 2013-11-05 DIAGNOSIS — I10 Essential (primary) hypertension: Secondary | ICD-10-CM

## 2013-11-05 DIAGNOSIS — E1165 Type 2 diabetes mellitus with hyperglycemia: Secondary | ICD-10-CM

## 2013-11-05 DIAGNOSIS — E119 Type 2 diabetes mellitus without complications: Secondary | ICD-10-CM

## 2013-11-05 DIAGNOSIS — IMO0002 Reserved for concepts with insufficient information to code with codable children: Secondary | ICD-10-CM

## 2013-11-05 DIAGNOSIS — Z23 Encounter for immunization: Secondary | ICD-10-CM

## 2013-11-05 DIAGNOSIS — IMO0001 Reserved for inherently not codable concepts without codable children: Secondary | ICD-10-CM

## 2013-11-05 MED ORDER — LANCETS ULTRA THIN 30G MISC: Status: DC

## 2013-11-05 MED ORDER — LOSARTAN POTASSIUM-HCTZ 100-25 MG PO TABS: ORAL_TABLET | ORAL | Status: DC

## 2013-11-05 MED ORDER — PANTOPRAZOLE SODIUM 40 MG PO TBEC: DELAYED_RELEASE_TABLET | ORAL | Status: DC

## 2013-11-05 MED ORDER — INSULIN GLARGINE 100 UNIT/ML SOLOSTAR PEN: 30.0000 [IU] | PEN_INJECTOR | Freq: Every day | SUBCUTANEOUS | Status: DC

## 2013-11-05 MED ORDER — ATORVASTATIN CALCIUM 40 MG PO TABS: 40.0000 mg | ORAL_TABLET | Freq: Every day | ORAL | Status: DC

## 2013-11-05 MED ORDER — METFORMIN HCL 1000 MG PO TABS: 1000.0000 mg | ORAL_TABLET | Freq: Two times a day (BID) | ORAL | Status: DC

## 2013-11-05 MED ORDER — INSULIN PEN NEEDLE 31G X 5 MM MISC: Status: DC

## 2013-11-05 MED ORDER — AMLODIPINE BESYLATE 10 MG PO TABS: ORAL_TABLET | ORAL | Status: DC

## 2013-11-05 MED ORDER — GLUCOSE BLOOD VI STRP: ORAL_STRIP | Status: DC

## 2013-11-05 MED ORDER — INSULIN ASPART 100 UNIT/ML ~~LOC~~ SOLN: SUBCUTANEOUS | Status: DC

## 2013-11-05 MED ORDER — IBUPROFEN 800 MG PO TABS: ORAL_TABLET | ORAL | Status: DC

## 2013-11-05 MED ORDER — ASPIRIN EC 81 MG PO TBEC: 81.0000 mg | DELAYED_RELEASE_TABLET | Freq: Every day | ORAL | Status: DC

## 2013-11-05 NOTE — Assessment & Plan Note (Signed)
Hypertension is at goal. Continuing current therapy with Norvasc, Coreg, losartan/HCTZ.

## 2013-11-05 NOTE — Assessment & Plan Note (Signed)
Currently well controlled.  Last A1c was in January and was 5.8.  Patient not due for A1c today. Given resting episodes and recent A1c will further titrate insulin down.  Lowering Lantus to a single injection of 30 units daily.  Also decreasing NovoLog to 10-12 units 3 times a day with meals. Patient to followup in approximately one month for A1c check.

## 2013-11-05 NOTE — Progress Notes (Signed)
   Subjective:    Patient ID: Craig [name], male    DOB: August 06, 1961, 53 y.o.   MRN: [id number]  HPI Mr. Berenguer is a 53 year old Morbidly obese male with a PMH of HTN, HLD and DM-2 who presents for follow up.  1) Diabetes mellitus, type 2 Disease Monitoring  Blood Sugar Ranges: Fasting 70's-100's  Polyuria: no   Visual problems: no   Medication Compliance: yes; Patient takes Metformin 1000 mg BID, Lantus 16 units BID, Novolog 14/14/16. Medication Side Effects  Hypoglycemia: Patient has been experiencing more frequent hypoglycemia recently.  He r responds appropriately.  2) HTN Disease Monitoring: Home BP Monitoring - No Medications: Norvasc 10 mg daily, Coreg 6.25 mg BID, and Losartan/HCTZ 100-25 mg daily Compliance -  Yes ROS: Denies chest pain, SOB, lightheadedness/dizziness   Review of Systems Per HPI    Objective:   Physical Exam Filed Vitals:   11/05/13 1019  BP: 121/70  Pulse: 68  Temp: 97.9 F (36.6 C)   Exam: General: well appearing obese male in NAD.  Pleasant and conversant; In good spirits.  Extremities: Trace LE edema; chronic changes secondary to venous insufficiency noted.     Assessment & Plan:  See Problem List

## 2013-11-05 NOTE — Patient Instructions (Signed)
It was nice to see you today.  You are doing well.   I have backed off on your insulin:  Lantus 30 units daily; Novolog 10-12 units three times daily before meals.  Please return in 1 month (after April 14) for a repeat A1C.  It is just a lab visit.  I will see you in 3 - 6 months.

## 2013-12-05 ENCOUNTER — Ambulatory Visit: Payer: Medicaid Other | Admitting: Family Medicine

## 2013-12-08 ENCOUNTER — Other Ambulatory Visit: Payer: Medicaid Other

## 2014-01-16 ENCOUNTER — Other Ambulatory Visit: Payer: Self-pay | Admitting: *Deleted

## 2014-01-16 MED ORDER — CARVEDILOL 6.25 MG PO TABS: ORAL_TABLET | ORAL | Status: DC

## 2014-01-21 ENCOUNTER — Other Ambulatory Visit: Payer: Self-pay | Admitting: *Deleted

## 2014-01-21 MED ORDER — CARVEDILOL 6.25 MG PO TABS: ORAL_TABLET | ORAL | Status: DC

## 2014-01-27 ENCOUNTER — Ambulatory Visit (INDEPENDENT_AMBULATORY_CARE_PROVIDER_SITE_OTHER): Payer: Medicaid Other | Admitting: Family Medicine

## 2014-01-27 ENCOUNTER — Encounter: Payer: Self-pay | Admitting: Family Medicine

## 2014-01-27 VITALS — BP 98/51 | HR 66 | Temp 98.7°F | Ht 69.0 in | Wt 376.0 lb

## 2014-01-27 DIAGNOSIS — E785 Hyperlipidemia, unspecified: Secondary | ICD-10-CM

## 2014-01-27 DIAGNOSIS — Z1211 Encounter for screening for malignant neoplasm of colon: Secondary | ICD-10-CM

## 2014-01-27 DIAGNOSIS — I10 Essential (primary) hypertension: Secondary | ICD-10-CM

## 2014-01-27 DIAGNOSIS — Z0000 Encounter for general adult medical examination without abnormal findings: Secondary | ICD-10-CM | POA: Insufficient documentation

## 2014-01-27 DIAGNOSIS — E119 Type 2 diabetes mellitus without complications: Secondary | ICD-10-CM

## 2014-01-27 DIAGNOSIS — Z6841 Body Mass Index (BMI) 40.0 and over, adult: Secondary | ICD-10-CM

## 2014-01-27 LAB — CBC
HEMATOCRIT: 34.9 % — AB (ref 39.0–52.0)
HEMOGLOBIN: 11.2 g/dL — AB (ref 13.0–17.0)
MCH: 27.3 pg (ref 26.0–34.0)
MCHC: 32.1 g/dL (ref 30.0–36.0)
MCV: 85.1 fL (ref 78.0–100.0)
PLATELETS: 280 10*3/uL (ref 150–400)
RBC: 4.1 MIL/uL — AB (ref 4.22–5.81)
RDW: 15.3 % (ref 11.5–15.5)
WBC: 8.8 10*3/uL (ref 4.0–10.5)

## 2014-01-27 LAB — POCT GLYCOSYLATED HEMOGLOBIN (HGB A1C): Hemoglobin A1C: 5.8

## 2014-01-27 LAB — COMPLETE METABOLIC PANEL WITH GFR
ALT: 22 U/L (ref 0–53)
AST: 19 U/L (ref 0–37)
Albumin: 3.6 g/dL (ref 3.5–5.2)
Alkaline Phosphatase: 92 U/L (ref 39–117)
BUN: 15 mg/dL (ref 6–23)
CALCIUM: 9.3 mg/dL (ref 8.4–10.5)
CHLORIDE: 102 meq/L (ref 96–112)
CO2: 30 meq/L (ref 19–32)
Creat: 1.13 mg/dL (ref 0.50–1.35)
GFR, EST NON AFRICAN AMERICAN: 75 mL/min
GFR, Est African American: 87 mL/min
Glucose, Bld: 55 mg/dL — ABNORMAL LOW (ref 70–99)
Potassium: 4.1 mEq/L (ref 3.5–5.3)
Sodium: 138 mEq/L (ref 135–145)
Total Bilirubin: 0.3 mg/dL (ref 0.2–1.2)
Total Protein: 7.4 g/dL (ref 6.0–8.3)

## 2014-01-27 MED ORDER — METFORMIN HCL 1000 MG PO TABS: 1000.0000 mg | ORAL_TABLET | Freq: Two times a day (BID) | ORAL | Status: DC

## 2014-01-27 MED ORDER — AMLODIPINE BESYLATE 10 MG PO TABS: 5.0000 mg | ORAL_TABLET | Freq: Every day | ORAL | Status: DC

## 2014-01-27 MED ORDER — ATORVASTATIN CALCIUM 40 MG PO TABS: 40.0000 mg | ORAL_TABLET | Freq: Every day | ORAL | Status: DC

## 2014-01-27 MED ORDER — LOSARTAN POTASSIUM-HCTZ 100-25 MG PO TABS: ORAL_TABLET | ORAL | Status: DC

## 2014-01-27 MED ORDER — PANTOPRAZOLE SODIUM 40 MG PO TBEC: DELAYED_RELEASE_TABLET | ORAL | Status: DC

## 2014-01-27 MED ORDER — ASPIRIN EC 81 MG PO TBEC: 81.0000 mg | DELAYED_RELEASE_TABLET | Freq: Every day | ORAL | Status: DC

## 2014-01-27 MED ORDER — CARVEDILOL 6.25 MG PO TABS: ORAL_TABLET | ORAL | Status: DC

## 2014-01-27 NOTE — Assessment & Plan Note (Signed)
BP low today - 98/51. Decreasing Norvasc to 5 mg daily.

## 2014-01-27 NOTE — Assessment & Plan Note (Signed)
Lipid panel today. Refilled Atorvastatin.

## 2014-01-27 NOTE — Assessment & Plan Note (Addendum)
~  20 lb weight loss since last visit. Congratulated patient. Recommended continued weight loss via diet and exercise.

## 2014-01-27 NOTE — Patient Instructions (Signed)
It was nice to see you today.   Below is what we talked about today and the plan:  1) Diabetes - Cut back on your insulin. Lantus 30 units daily.  Novolog 10 units 3 times daily with meals.   2) High BP - I have reduced your Norvasc to 5 mg daily (1/2 tablet)  3) Obesity - Your doing great.  Congratulations on your weight loss.  Keep up the good work.  4) Preventative care - Follow up this fall for your Pneumonia vaccine and influenza vaccine. - We will be in contact regarding your Colonoscopy.   Follow up in ~ 3-6 months.

## 2014-01-27 NOTE — Assessment & Plan Note (Signed)
A1C 5.8 today. Decreased Novolog to 10 units TIDAC. Patient to continue Lantus 30 units daily and Metformin. Will likely need continued decrease in insulin with continued weight loss.

## 2014-01-27 NOTE — Progress Notes (Signed)
   Subjective:    Patient ID: Craig [name], male    DOB: Dec 30, 1960, 53 y.o.   MRN: [id number]  HPI 53 year old gentleman with HTN, Morbid obesity, GERD, HLD and DM-2 presents for follow up.  1) Diabetes mellitus, type 2 Disease Monitoring  Blood Sugar Ranges: Checks TID. Fasting 90's - 130's.   Polyuria: No    Visual problems: no   Medication Compliance: yes; Lantus 30 units daily. Medication Side Effects  Hypoglycemia: Occasional; After exercise.  Preventitive Health Care  Eye Exam: In need of annual eye exam or retinal scan.  Foot Exam: In need of annual foot examination  2) HTN Disease Monitoring: Home BP Monitoring - No. Plans on start checking daily. Medications:  Coreg, HCTZ/Losartan, Norvasc.   Compliance -  Yes; Compliant with Coreg, HCTZ/Losartan, Norvasc.   ROS: Denies chest pain, SOB, lightheadedness/dizziness   3) Morbid Obesity - Patient reports increased physical activity (particulary cardio) - He also states that he has been cutting back on his portion sizes and eating more vegetables.  4) HLD - Compliant with Atorvastatin - No reported side effects.  Social Hx - Former smoker.  Review of Systems Per HPI    Objective:   Physical Exam Filed Vitals:   01/27/14 0850  BP: 98/51  Pulse: 66  Temp: 98.7 F (37.1 C)   Exam: General: well appearing obese male; NAD.  Cardiovascular: RRR. No murmurs, rubs, or gallops. Respiratory: CTAB. No rales, rhonchi, or wheeze. Abdomen: obese, soft, nontender.  Extremities: Venous stasis changes noted.  Diabetic Foot Check -  Appearance - no lesions, ulcers or calluses Skin - no unusual pallor or redness Monofilament testing -  Right - Great toe, medial, central, lateral ball and posterior foot intact Left - Great toe, medial, central, lateral ball and posterior foot intact     Assessment & Plan:  See Problem List

## 2014-01-27 NOTE — Assessment & Plan Note (Signed)
Referral to GI placed for colonoscopy. DM foot exam performed today. Patient in need of yearly eye/retinal exam (could not perform today, so patient to return for this). Offered Pneumovax (patient deferred until this fall).

## 2014-02-16 ENCOUNTER — Ambulatory Visit (INDEPENDENT_AMBULATORY_CARE_PROVIDER_SITE_OTHER): Payer: Medicaid Other | Admitting: Home Health Services

## 2014-02-16 DIAGNOSIS — E119 Type 2 diabetes mellitus without complications: Secondary | ICD-10-CM

## 2014-02-17 NOTE — Progress Notes (Signed)
DIABETES Pt came in to have a retinal scan per diabetic care.   Image was taken and submitted to [facility]-DR. Cathren Laine for reading.    Results will be available in 1-2 weeks.  Results will be given to PCP for review and to contact patient.  Vinnie Level

## 2014-05-04 ENCOUNTER — Other Ambulatory Visit: Payer: Self-pay | Admitting: Family Medicine

## 2014-05-07 ENCOUNTER — Encounter: Payer: Self-pay | Admitting: Pharmacist

## 2014-05-07 ENCOUNTER — Ambulatory Visit (INDEPENDENT_AMBULATORY_CARE_PROVIDER_SITE_OTHER): Payer: Medicaid Other | Admitting: Pharmacist

## 2014-05-07 VITALS — BP 142/73 | HR 90 | Ht 69.5 in | Wt 361.0 lb

## 2014-05-07 DIAGNOSIS — IMO0002 Reserved for concepts with insufficient information to code with codable children: Secondary | ICD-10-CM

## 2014-05-07 DIAGNOSIS — I10 Essential (primary) hypertension: Secondary | ICD-10-CM

## 2014-05-07 DIAGNOSIS — Z6841 Body Mass Index (BMI) 40.0 and over, adult: Secondary | ICD-10-CM

## 2014-05-07 DIAGNOSIS — E1165 Type 2 diabetes mellitus with hyperglycemia: Secondary | ICD-10-CM

## 2014-05-07 DIAGNOSIS — E119 Type 2 diabetes mellitus without complications: Secondary | ICD-10-CM

## 2014-05-07 DIAGNOSIS — E785 Hyperlipidemia, unspecified: Secondary | ICD-10-CM

## 2014-05-07 DIAGNOSIS — IMO0001 Reserved for inherently not codable concepts without codable children: Secondary | ICD-10-CM

## 2014-05-07 MED ORDER — AMLODIPINE BESYLATE 2.5 MG PO TABS: 2.5000 mg | ORAL_TABLET | Freq: Every day | ORAL | Status: DC

## 2014-05-07 NOTE — Progress Notes (Signed)
S:    Patient arrives in a pleasant mood. Presents for diabetes follow-up  Patient reports having longstanding diabetes. He tests his blood sugars 6 times daily and keeps detailed records of his readings. Patient has experienced several low blood sugars since his last visit. Some readings as low as in the 50's. He endorses passing out twice in the past week. Patient knows when these lows are coming on because they are preceded by a sharp pain in his head, followed by sweating, dizziness, and shakiness. Patient states he uses glucose tabs to increase his blood sugars for these episodes. These episodes occur at various times during the day, but commonly lunchtime and sometimes around dinner. Patient exercises regularly and has lost another 15 lbs in 3 months since his last visit. He enjoys bowling and is getting plenty of cardio per the patient. Patient's diet consists of pre-made meals supplied by a nutritionist and has fruit or Glucerna for snacks in between meals. Denies neuropathy in his feet, but does endorse tingling in his hands during spells of low blood sugars and occasionally at other times.   O:  . Lab Results  Component Value Date   HGBA1C 5.8 01/27/2014    Fasting CBGs mostly 70-110 with lows in the 50s and an occasional reading in the 200s.  A/P: Longstanding diabetes currently controlled with an A1c of 5.8. Endorses several hypoglycemic events and is able to verbalize appropriate hypoglycemia management plan.  Reports adherence with medication. Control is suboptimal due to frequent low blood sugar readings given recent weight loss.  Decreased dose of basal insulin Lantus (insulin glargine) from 30 to 20 units daily. Decreased dose of rapid insulin Novolog (insulin aspart) from 10 to 7 units 3 times daily with meals and stressed to not use the mealtime insulin when having a snack, specifically with the Glucerna. Next A1C anticipated in December 2015.  Written patient instructions provided.   Follow up in Pharmacist Clinic Visit in November. Patient is to call the office if he continues to experience lows (if consistently below 90). Counseled patient that his goal fasting blood sugars should be in the 120's.   HTN currently controlled on carvedilol 6.25mg  twice daily, amlodipine 5mg  daily, and losartan/HCTZ 100/25mg  daily with BP in clinic of 142/63mmHg today. Endorses some orthostatasis upon arising from sitting and lying down. Decreased amlodipine 2.5mg  daily and continued the carvedilol and losartan/HCTZ.   Patient has lost an additional 15 lbs since his last visit due to increased exercise and meal portion control. Encouraged patient to continue with his current diet and exercise program.   Total time in face to face counseling 30 minutes.  Patient seen with Gloriajean Dell, PharmD Candidate;  Fuller Canada,  PharmD Resident

## 2014-05-07 NOTE — Patient Instructions (Addendum)
Cut back to Lantus 20 units daily. Cut back to Novolog 7 units at meal times. Do not take Novolog with Glucerna. Goal morning blood sugar readings 110-130. Call pharmacy clinic if blood sugar readings in the 70s or 80s. Cut back to amlodipine 2.5 mg daily. Follow up with pharmacy clinic in November.

## 2014-05-07 NOTE — Assessment & Plan Note (Signed)
Patient has lost an additional 15 lbs since his last visit due to increased exercise and meal portion control. Encouraged patient to continue with his current diet and exercise program.

## 2014-05-07 NOTE — Assessment & Plan Note (Addendum)
HTN currently controlled on carvedilol 6.25mg  twice daily, amlodipine 5mg  daily, and losartan/HCTZ 100/25mg  daily with BP in clinic of 142/38mmHg today. Endorses some orthostatasis upon arising from sitting and lying down. Decreased amlodipine 2.5mg  daily and continued the carvedilol and losartan/HCTZ.

## 2014-05-07 NOTE — Assessment & Plan Note (Addendum)
Longstanding diabetes currently controlled with an A1c of 5.8. Endorses several hypoglycemic events and is able to verbalize appropriate hypoglycemia management plan.  Reports adherence with medication. Control is suboptimal due to frequent low blood sugar readings given recent weight loss.  Decreased dose of basal insulin Lantus (insulin glargine) from 30 to 20 units daily. Decreased dose of rapid insulin Novolog (insulin aspart) from 10 to 7 units 3 times daily with meals and stressed to not use the mealtime insulin when having a snack, specifically with the Glucerna. Next A1C anticipated in December 2015.  Written patient instructions provided.  Follow up in Pharmacist Clinic Visit in November. Patient is to call the office if he continues to experience lows (if consistently below 90). Counseled patient that his goal fasting blood sugars should be in the 120's.Patient has lost an additional 15 lbs since his last visit due to increased exercise and meal portion control. Encouraged patient to continue with his current diet and exercise program.

## 2014-05-07 NOTE — Assessment & Plan Note (Addendum)
Longstanding hyperlipidemia. Patient continues on atorvastatin 40mg  daily at this time without complaint.  Last evaluated> 12 months ago.  Obtained lipid panel prior to leaving office today.  Lipid Panel results - LDL = 81, Trig = 81 and HDL = 32   - NO change in tx suggested.

## 2014-05-08 LAB — LIPID PANEL
Cholesterol: 129 mg/dL (ref 0–200)
HDL: 32 mg/dL — ABNORMAL LOW (ref >39)
LDL CALC: 81 mg/dL (ref 0–99)
TRIGLYCERIDES: 81 mg/dL (ref <150)
Total CHOL/HDL Ratio: 4 Ratio
VLDL: 16 mg/dL (ref 0–40)

## 2014-05-13 NOTE — Progress Notes (Signed)
Patient ID: Craig [name], male   DOB: 11-26-1960, 53 y.o.   MRN: [id number] Reviewed: Agree with Dr. Graylin Shiver documentation and management.

## 2014-05-15 ENCOUNTER — Encounter: Payer: Self-pay | Admitting: Family Medicine

## 2014-05-15 ENCOUNTER — Ambulatory Visit (INDEPENDENT_AMBULATORY_CARE_PROVIDER_SITE_OTHER): Payer: Medicaid Other | Admitting: Family Medicine

## 2014-05-15 VITALS — BP 113/88 | HR 88 | Temp 98.2°F | Ht 70.0 in | Wt 361.0 lb

## 2014-05-15 DIAGNOSIS — I10 Essential (primary) hypertension: Secondary | ICD-10-CM

## 2014-05-15 DIAGNOSIS — E119 Type 2 diabetes mellitus without complications: Secondary | ICD-10-CM

## 2014-05-15 DIAGNOSIS — Z0000 Encounter for general adult medical examination without abnormal findings: Secondary | ICD-10-CM

## 2014-05-15 LAB — POCT GLYCOSYLATED HEMOGLOBIN (HGB A1C): HEMOGLOBIN A1C: 5.6

## 2014-05-15 MED ORDER — SILDENAFIL CITRATE 50 MG PO TABS: 50.0000 mg | ORAL_TABLET | Freq: Every day | ORAL | Status: DC | PRN

## 2014-05-15 NOTE — Assessment & Plan Note (Signed)
Patient endorsing symptoms consistent with orthostasis. Blood pressure well controlled currently. Will stop amlodipine.

## 2014-05-15 NOTE — Progress Notes (Signed)
   Subjective:    Patient ID: Craig [name], male    DOB: March 01, 1961, 53 y.o.   MRN: [id number]  HPI 53 year old male with HTN, HLD, Morbid obesity and DM-2 presents for follow up.  1) Diabetes mellitus, type 2 Disease Monitoring  Blood Sugar Ranges: Fasting 80's - low 100's.  Polyuria: no   Visual problems: no   Medication Compliance: yes; Currently on Lantus 20 units daily and Novolog 7/7/7. Medication Side Effects  Hypoglycemia: He notes occasional hypoglycemia; typically associated with exercise.  2) HTN Disease Monitoring: Home BP Monitoring - Not currently; But he reports that he will be starting this soon. Medications: Losartan/HCTZ; Coreg; Amlodipine Compliance -  Yes ROS: Denies chest pain, SOB.  Does report intermittent dizziness (particularly on standing; reflective of orthostasis). This was reported to Dr. Valentina Lucks at previous visit.  At that time Amlodipine was decreased to 2.5 mg daily.    3) Sexual dysfunction - Patient reports that he recently found a male partner that he is intimate with.   - On a few occasions he has had difficulty sustaining his erection. - No reports of premature ejaculation or difficulty attaining erection.  - He does not some anxiety around the situation, but does not feel like it is interfering.  Social Hx - Nonsmoker.  Review of Systems Per HPI with the following additions:  No recent fever, chills, nausea, vomiting.    Objective:   Physical Exam Filed Vitals:   05/15/14 1051  BP: 113/88  Pulse: 88  Temp: 98.2 F (36.8 C)   Exam: General: morbidly obese African [place] male, pleasant; NAD.  Psych: Pleasant and happy. Normal mood and affect.    Assessment & Plan:  See Problem List

## 2014-05-15 NOTE — Patient Instructions (Signed)
It was nice to see you today.  Take the Viagra as needed 1 hour prior to sexual activity.  Stop the Amlodipine.  Cut back on your insulin.  Novolog 5/5/5.  Lantus 15 units daily.  Follow up in 1-2 weeks for your flu shot and pneumonia shot.  Take care,  See you in 3-6 months.

## 2014-05-15 NOTE — Assessment & Plan Note (Signed)
A1c 5.6 today. Given intermittent hypoglycemia with physical activity and A1c, will decrease Lantus to 15 units daily and NovoLog to 5/5/5.

## 2014-05-15 NOTE — Assessment & Plan Note (Signed)
Patient plans on returning for influenza and pneumovax. Placing referral to GI as patient is in need of colonoscopy.

## 2014-05-29 ENCOUNTER — Ambulatory Visit (INDEPENDENT_AMBULATORY_CARE_PROVIDER_SITE_OTHER): Payer: Medicaid Other | Admitting: *Deleted

## 2014-05-29 DIAGNOSIS — Z23 Encounter for immunization: Secondary | ICD-10-CM

## 2014-06-19 ENCOUNTER — Telehealth: Payer: Self-pay | Admitting: *Deleted

## 2014-06-19 NOTE — Telephone Encounter (Signed)
Pt called he was on his way out of town and did not have any of PO medications with him.  The only thing that he would have is his insulin and meter.  Pt advised to monitor his blood sugars and blood pressures over the weekend. If he start having chest pain, dizziness, SOB, headache, n/v, abd pain to go to urgent care.  Pt also advised to eat healthy.  He will return on Sunday.  Derl Barrow, RN

## 2014-08-03 ENCOUNTER — Encounter: Payer: Self-pay | Admitting: *Deleted

## 2014-08-03 NOTE — Progress Notes (Signed)
Prior Authorization received from [facility] for Losartan-HCTZ. Formulary and PA form placed in provider box for completion. Derl Barrow, RN

## 2014-08-03 NOTE — Progress Notes (Signed)
PA for losartan-HCTZ pending per [employer].  Derl Barrow, RN   Received PA approval for losartan-HCTZ 100-25 mg via [employer].  Med approved for 08/03/14 - 08/03/2015.  [facility] informed.  PA confirmation number [id number] Teresita Madura, Rosine Beat, RN

## 2014-08-26 ENCOUNTER — Encounter: Payer: Self-pay | Admitting: *Deleted

## 2014-08-26 ENCOUNTER — Encounter: Payer: Self-pay | Admitting: Family Medicine

## 2014-08-26 ENCOUNTER — Ambulatory Visit (INDEPENDENT_AMBULATORY_CARE_PROVIDER_SITE_OTHER): Payer: Medicaid Other | Admitting: Family Medicine

## 2014-08-26 VITALS — BP 116/57 | HR 78 | Ht 70.0 in | Wt 367.0 lb

## 2014-08-26 DIAGNOSIS — Z1211 Encounter for screening for malignant neoplasm of colon: Secondary | ICD-10-CM

## 2014-08-26 DIAGNOSIS — Z0000 Encounter for general adult medical examination without abnormal findings: Secondary | ICD-10-CM

## 2014-08-26 DIAGNOSIS — I10 Essential (primary) hypertension: Secondary | ICD-10-CM

## 2014-08-26 DIAGNOSIS — E119 Type 2 diabetes mellitus without complications: Secondary | ICD-10-CM

## 2014-08-26 LAB — POCT GLYCOSYLATED HEMOGLOBIN (HGB A1C): Hemoglobin A1C: 5.5

## 2014-08-26 MED ORDER — INSULIN GLARGINE 100 UNIT/ML SOLOSTAR PEN: 15.0000 [IU] | PEN_INJECTOR | Freq: Every day | SUBCUTANEOUS | Status: DC

## 2014-08-26 MED ORDER — LOSARTAN POTASSIUM-HCTZ 50-12.5 MG PO TABS: 1.0000 | ORAL_TABLET | Freq: Every day | ORAL | Status: DC

## 2014-08-26 NOTE — Progress Notes (Signed)
   Subjective:    Patient ID: Craig [name], male    DOB: 10-05-1960, 54 y.o.   MRN: [id number]  HPI 54 year old morbidly obese male with past medical history of DM 2, hyper lipidemia, and hypertension presents for follow-up.  1) DM-2  CBG's - Well controlled. Fasting 72-135 this month.  Medications - Lantus 15 units daily, NovoLog 5/5/5.  Compliance - yes.  Medication side effects  Hypoglycemia: no   2) HTN  Home BP Monitoring - No.   Medications - Losartan/HCTZ 100/25 daily, Coreg 6.25 mg BID.  Compliance -  Yes.   ROS: Denies chest pain, SOB. Reports lightheadedness/dizziness (today).   Review of Systems Per HPI    Objective:   Physical Exam Filed Vitals:   08/26/14 0944  BP: 116/57  Pulse: 78   Exam: General: morbidly obese male in NAD.  Cardiovascular: RRR. No murmurs, rubs, or gallops. Respiratory: CTAB. No rales, rhonchi, or wheeze. Abdomen: soft, nontender, nondistended.    Assessment & Plan:  See Problem List

## 2014-08-26 NOTE — Progress Notes (Signed)
Prior Authorization received from [facility] for Losartan-HCTZ. Formulary and PA form placed in provider box for completion. Derl Barrow, RN

## 2014-08-26 NOTE — Assessment & Plan Note (Signed)
Patient in need of a colonoscopy. Placing referral.

## 2014-08-26 NOTE — Assessment & Plan Note (Signed)
BP 116/57 today. Will decrease Losartan/HCTZ to 50-12.5 mg daily.

## 2014-08-26 NOTE — Assessment & Plan Note (Signed)
A1c 5.5 today. Will stop NovoLog altogether.  Will continue Lantus 15 units daily. Follow-up in 3 months.

## 2014-08-26 NOTE — Patient Instructions (Signed)
It was great to see you today.  I have decreased your Losartan/HCTZ to 50-12.5 mg daily.   I will change your insulin according to your A1C today.  Follow up in 6 months.  Take care  Dr. Lacinda Axon

## 2014-08-27 NOTE — Progress Notes (Signed)
PA pending per [employer] [employer].  Confirmation number [id number] Teresita Madura, Rosine Beat, RN   Received PA approval for Losartan-HCTZ via [employer].  Med approved for 08/27/14 - 08/27/15.  [facility] informed.  PA confirmation number [id number] Teresita Madura, Rosine Beat, RN

## 2014-09-01 ENCOUNTER — Telehealth: Payer: Self-pay | Admitting: *Deleted

## 2014-09-01 ENCOUNTER — Encounter: Payer: Self-pay | Admitting: Internal Medicine

## 2014-09-01 NOTE — Telephone Encounter (Signed)
Spoke with patient and informed him of his pre op colonoscopy on 09/22/2014 @ 3:30pm and procedure on 2/23 @8am . patient agreed to this. It has been set up at [facility] and they will send him out a packet also. patient was informed to call them if needing to reschedule or cancel.Rockne Coons

## 2014-09-14 ENCOUNTER — Telehealth: Payer: Self-pay | Admitting: Family Medicine

## 2014-09-14 NOTE — Telephone Encounter (Signed)
Craig [name] called and needs some ICD-10 codes for dates of service. Patient is in a home health care program. Please call her at [phone]. jw

## 2014-09-15 NOTE — Telephone Encounter (Signed)
Craig [name] is calling back again to get the ICD-10 codes for the patient. jw

## 2014-09-15 NOTE — Telephone Encounter (Signed)
LVM for Janette to call back to discuss below

## 2014-09-17 ENCOUNTER — Telehealth: Payer: Self-pay | Admitting: *Deleted

## 2014-09-17 NOTE — Telephone Encounter (Signed)
discussed

## 2014-09-17 NOTE — Telephone Encounter (Signed)
Pt has a BMi of 52.8 and a weight if 367 lb per his last OV 08-26-2014. Called Pt and informed we need to see him in the office with an APP. He verbalized understanding . Appointment made for 10-12-2014 at 0830 with Wallace Going at [facility] request.  Told to check in 3rd floor by 0815 that morning. Appointment reminder with date time location mailed to pt's home address.   Lelan Pons PV

## 2014-09-26 ENCOUNTER — Other Ambulatory Visit: Payer: Self-pay | Admitting: Family Medicine

## 2014-10-06 ENCOUNTER — Encounter: Payer: Medicaid Other | Admitting: Internal Medicine

## 2014-10-12 ENCOUNTER — Ambulatory Visit: Payer: Medicaid Other | Admitting: Nurse Practitioner

## 2014-11-04 ENCOUNTER — Other Ambulatory Visit: Payer: Self-pay | Admitting: Family Medicine

## 2014-11-17 ENCOUNTER — Other Ambulatory Visit: Payer: Self-pay | Admitting: Family Medicine

## 2014-11-30 ENCOUNTER — Other Ambulatory Visit: Payer: Self-pay | Admitting: Family Medicine

## 2014-12-04 ENCOUNTER — Other Ambulatory Visit: Payer: Self-pay | Admitting: Family Medicine

## 2014-12-14 ENCOUNTER — Encounter: Payer: Self-pay | Admitting: Internal Medicine

## 2014-12-14 ENCOUNTER — Ambulatory Visit (INDEPENDENT_AMBULATORY_CARE_PROVIDER_SITE_OTHER): Payer: Medicaid Other | Admitting: Family Medicine

## 2014-12-14 ENCOUNTER — Encounter: Payer: Self-pay | Admitting: Family Medicine

## 2014-12-14 VITALS — BP 130/63 | HR 70 | Temp 98.3°F | Ht 70.0 in | Wt 381.3 lb

## 2014-12-14 DIAGNOSIS — I10 Essential (primary) hypertension: Secondary | ICD-10-CM

## 2014-12-14 DIAGNOSIS — Z0000 Encounter for general adult medical examination without abnormal findings: Secondary | ICD-10-CM

## 2014-12-14 DIAGNOSIS — E119 Type 2 diabetes mellitus without complications: Secondary | ICD-10-CM

## 2014-12-14 DIAGNOSIS — Z114 Encounter for screening for human immunodeficiency virus [HIV]: Secondary | ICD-10-CM | POA: Diagnosis not present

## 2014-12-14 DIAGNOSIS — B353 Tinea pedis: Secondary | ICD-10-CM

## 2014-12-14 DIAGNOSIS — Z1159 Encounter for screening for other viral diseases: Secondary | ICD-10-CM | POA: Diagnosis not present

## 2014-12-14 DIAGNOSIS — E785 Hyperlipidemia, unspecified: Secondary | ICD-10-CM

## 2014-12-14 DIAGNOSIS — Z6841 Body Mass Index (BMI) 40.0 and over, adult: Secondary | ICD-10-CM

## 2014-12-14 DIAGNOSIS — D6489 Other specified anemias: Secondary | ICD-10-CM | POA: Diagnosis not present

## 2014-12-14 DIAGNOSIS — D649 Anemia, unspecified: Secondary | ICD-10-CM | POA: Diagnosis not present

## 2014-12-14 LAB — POCT GLYCOSYLATED HEMOGLOBIN (HGB A1C): Hemoglobin A1C: 5.5

## 2014-12-14 MED ORDER — INSULIN PEN NEEDLE 31G X 8 MM MISC: Status: DC

## 2014-12-14 MED ORDER — IBUPROFEN 800 MG PO TABS: ORAL_TABLET | ORAL | Status: DC

## 2014-12-14 MED ORDER — TERBINAFINE HCL 1 % EX CREA: 1.0000 "application " | TOPICAL_CREAM | Freq: Two times a day (BID) | CUTANEOUS | Status: DC

## 2014-12-14 NOTE — Progress Notes (Signed)
   Subjective:    Patient ID: Craig [name], male    DOB: 1961/05/18, 54 y.o.   MRN: [id number]  HPI 54 year old morbidly obese male with past medical history of DM 2, HLD, and HTN presents for an annual physical exam.   1) Preventative care  He is doing well currently.  He is in need of:  Colonoscopy - this is scheduled for July  HIV screening  Eye exam later this year  Foot exam  2)  DM-2  Well-controlled.  CBG's -  fasting range from 75 200.  Medications -  Lantus 15 units daily,  NovoLog 10 units twice a day with meals.  Compliance -  Yes.  Medication side effects  Hypoglycemia: no.  3) HTN  Well-controlled.  Home BP Monitoring - No.   Medications -  Coreg 6.25 mg twice a day, Hyzaar 50/12.5 daily  Compliance -  Yes.   ROS: Denies chest pain, SOB, lightheadedness/dizziness  Review of Systems  Constitutional: Negative for fever and chills.  HENT: Negative.   Respiratory: Negative for shortness of breath and wheezing.   Cardiovascular: Negative for chest pain.  Gastrointestinal: Negative for nausea, vomiting and diarrhea.  Endocrine: Negative.   Genitourinary: Negative.   Musculoskeletal: Positive for arthralgias.  Skin: Negative.   Neurological: Negative.       Objective:   Physical Exam Filed Vitals:   12/14/14 1403  BP: 130/63  Pulse: 70  Temp: 98.3 F (36.8 C)   Vital signs reviewed.  Exam: General: well appearing morbidly obese male in no acute distress. HEENT: NCAT. Normal TM's bilaterally. Oropharynx clear. Poor dentition.  Neck: Supple; No lymphadenopathy or thyromegaly.  Cardiovascular: RRR. No murmurs, rubs, or gallops. Respiratory: CTAB. No rales, rhonchi, or wheeze. Abdomen: soft, nontender, nondistended. No organomegaly.  Extremities: Lower extremities with evidence of chronic venous stasis. 1+ LE edema. Skin: Warm, dry, intact. Neuro: No focal deficits.  Diabetic Foot Check -  Appearance - no lesions, ulcers or  calluses Skin - no unusual pallor or redness; Tinea pedis noted.  Monofilament testing -  Right - Great toe, medial, central, lateral ball and posterior foot intact Left - Great toe, medial, central, lateral ball and posterior foot intact     Assessment & Plan:  See Problem List

## 2014-12-14 NOTE — Patient Instructions (Signed)
It was nice to see you today.  Continue all of your medications as prescribed except for the insulin.  Please make the following changes - Stop the Lantus. - Continue Novolog. - Follow up in ~ 1 month.  I will send you the results of your labs in the mail.  Take care  Dr. Lacinda Axon

## 2014-12-15 ENCOUNTER — Encounter: Payer: Self-pay | Admitting: Family Medicine

## 2014-12-15 DIAGNOSIS — D649 Anemia, unspecified: Secondary | ICD-10-CM

## 2014-12-15 DIAGNOSIS — B353 Tinea pedis: Secondary | ICD-10-CM | POA: Insufficient documentation

## 2014-12-15 HISTORY — DX: Anemia, unspecified: D64.9

## 2014-12-15 LAB — ANEMIA PANEL 7
%SAT: 21 % (ref 20–55)
ABS RETIC: 74.8 10*3/uL (ref 19.0–186.0)
FOLATE: 10.7 ng/mL
Ferritin: 127 ng/mL (ref 22–322)
HEMATOCRIT: 37.6 % — AB (ref 39.0–52.0)
HEMOGLOBIN: 12 g/dL — AB (ref 13.0–17.0)
Iron: 56 ug/dL (ref 42–165)
MCH: 27.3 pg (ref 26.0–34.0)
MCHC: 31.9 g/dL (ref 30.0–36.0)
MCV: 85.5 fL (ref 78.0–100.0)
MPV: 10.4 fL (ref 8.6–12.4)
Platelets: 312 10*3/uL (ref 150–400)
RBC.: 4.4 MIL/uL (ref 4.22–5.81)
RBC: 4.4 MIL/uL (ref 4.22–5.81)
RDW: 15.2 % (ref 11.5–15.5)
RETIC CT PCT: 1.7 % (ref 0.4–2.3)
TIBC: 267 ug/dL (ref 215–435)
UIBC: 211 ug/dL (ref 125–400)
Vitamin B-12: 291 pg/mL (ref 211–911)
WBC: 7.8 10*3/uL (ref 4.0–10.5)

## 2014-12-15 LAB — HIV ANTIBODY (ROUTINE TESTING W REFLEX): HIV: NONREACTIVE

## 2014-12-15 LAB — HEPATITIS C ANTIBODY: HCV Ab: NEGATIVE

## 2014-12-15 NOTE — Assessment & Plan Note (Signed)
Continued to encourage weight loss.

## 2014-12-15 NOTE — Assessment & Plan Note (Signed)
Well controlled on statin.

## 2014-12-15 NOTE — Assessment & Plan Note (Signed)
Well-controlled. Will continue current therapy with Hyzaar and Coreg.

## 2014-12-15 NOTE — Assessment & Plan Note (Signed)
A1c 5.5 today. Stopping Lantus. Patient is to continue with Novolog.

## 2014-12-15 NOTE — Assessment & Plan Note (Addendum)
Colonoscopy scheduled for July. Diabetic foot exam performed today. HIV and HCV screening done today. Eye exam scheduled for later this year.

## 2014-12-15 NOTE — Assessment & Plan Note (Signed)
Treating with Lamisil.

## 2014-12-15 NOTE — Assessment & Plan Note (Signed)
CBC obtained. It revealed normocytic anemia. Anemia panel unremarkable. Will continue to follow. He is scheduled for colonoscopy in July.

## 2015-01-05 ENCOUNTER — Other Ambulatory Visit: Payer: Self-pay | Admitting: Family Medicine

## 2015-01-14 ENCOUNTER — Encounter: Payer: Self-pay | Admitting: Family Medicine

## 2015-01-14 ENCOUNTER — Ambulatory Visit (INDEPENDENT_AMBULATORY_CARE_PROVIDER_SITE_OTHER): Payer: Medicaid Other | Admitting: Family Medicine

## 2015-01-14 VITALS — BP 123/71 | HR 57 | Temp 98.2°F | Ht 70.0 in | Wt 378.5 lb

## 2015-01-14 DIAGNOSIS — I10 Essential (primary) hypertension: Secondary | ICD-10-CM | POA: Diagnosis not present

## 2015-01-14 DIAGNOSIS — E119 Type 2 diabetes mellitus without complications: Secondary | ICD-10-CM

## 2015-01-14 DIAGNOSIS — M25562 Pain in left knee: Secondary | ICD-10-CM | POA: Diagnosis not present

## 2015-01-14 DIAGNOSIS — E785 Hyperlipidemia, unspecified: Secondary | ICD-10-CM | POA: Diagnosis not present

## 2015-01-14 NOTE — Assessment & Plan Note (Signed)
Stable. Continue statin. Will need repeat lipid later this year.

## 2015-01-14 NOTE — Progress Notes (Signed)
   Subjective:    Patient ID: Craig [name], male    DOB: May 02, 1961, 54 y.o.   MRN: [id number]  HPI 54 year old male morbidly obese male with past medical history of DM 2, HLD, and HTN presents for follow up.  1) DM-2  CBG's - Well controlled. Range from 80's - 130.  Medications - Metformin, Novolog 5-12 units BID (Mostly 5 units BID).  Compliance - yes.   Medication side effects  Hypoglycemia: no   Preventative Healthcare up to date? Yes; just awaiting colonoscopy in July.  2) HTN  Well-controlled.  Home BP Monitoring - No.   Medications - Coreg 6.25 mg twice a day, Hyzaar 50/12.5 daily  Compliance - Yes.   ROS: Denies chest pain, SOB, lightheadedness/dizziness   3) HLD  Stable.  Tolerating statin.  No side effects.   4) Left knee pain  Just started today.  Mild to moderate.  No focal area of pain/tenderness.  No fall, trauma, injury.  No treatments tried.   Social Hx - Former smoker.   Review of Systems  Constitutional: Negative.   Respiratory: Negative for shortness of breath.   Cardiovascular: Negative for chest pain.  Musculoskeletal: Positive for arthralgias.      Objective:   Physical Exam Filed Vitals:   01/14/15 1348  BP: 123/71  Pulse: 57  Temp: 98.2 F (36.8 C)   Vital signs reviewed.  Exam: General: well appearing, NAD.  Cardiovascular: RRR. No murmurs, rubs, or gallops. Respiratory: CTAB. No rales, rhonchi, or wheeze. Extremities: Trace LE edema.   Knee: Left Normal to inspection with no erythema or effusion or obvious bony abnormalities. Palpation normal with no warmth, joint line tenderness, patellar tenderness.  ROM full.      Assessment & Plan:  See Problem List

## 2015-01-14 NOTE — Assessment & Plan Note (Signed)
At goal and doing well. Will continue current medication regimen.

## 2015-01-14 NOTE — Patient Instructions (Signed)
It was nice to see you.  Stop the novolog.  Continue checking your sugars regularly.  Continue the remainder of medications.   Call me in 2 weeks.  Follow up with Koval in 3 months.   Take care  Dr. Lacinda Axon

## 2015-01-14 NOTE — Assessment & Plan Note (Signed)
Continues to do well. CBG's well controlled. Discontinuing insulin today. Patient to call with CBG's and to check in with me in 2 weeks.

## 2015-01-14 NOTE — Progress Notes (Signed)
I was the preceptor for this visit. 

## 2015-01-14 NOTE — Assessment & Plan Note (Signed)
Exam unremarkable. Worsened by morbid obesity. Continue supportive care and PRN Tylenol and use of NSAID's (sparingly).

## 2015-01-20 ENCOUNTER — Other Ambulatory Visit: Payer: Self-pay | Admitting: Family Medicine

## 2015-02-01 ENCOUNTER — Telehealth: Payer: Self-pay | Admitting: *Deleted

## 2015-02-01 NOTE — Telephone Encounter (Signed)
Dr Carlean Purl, This patient is on the schedule for a direct colon with you 7-11 2016 Monday at 1030. He has a pV scheduled for 6-27. He has a bMI of 54.4 with a weight of 378 lbs.  He has a medical history of GERD, hx gastric cancer, HTN, diabetes, anemia, knee pain.   Do you want him to be a direct colon or do you want him to have an OV.  Please advise  Thanks a lot, Lelan Pons PV

## 2015-02-01 NOTE — Telephone Encounter (Signed)
Office visit not urgent

## 2015-02-01 NOTE — Telephone Encounter (Signed)
Spoke with PT and explained with medical conditions, BMI> 50 and weight 378 per dr Carlean Purl needs OV with him that's not urgent.  Explained PV 6-27 and colon 7-11 in the [place] has to be cancelled and needs the Ov to discuss further plans with MD.  Pt states he will call back and schedule this OV.  Instructed again needs to call and schedule this appointment as soon as possible. Pt verbalized understanding of instructions given today.  Lelan Pons PV

## 2015-02-02 ENCOUNTER — Other Ambulatory Visit: Payer: Self-pay | Admitting: Family Medicine

## 2015-02-13 ENCOUNTER — Other Ambulatory Visit: Payer: Self-pay | Admitting: Family Medicine

## 2015-02-22 ENCOUNTER — Encounter: Payer: Medicaid Other | Admitting: Internal Medicine

## 2015-03-09 ENCOUNTER — Telehealth: Payer: Self-pay | Admitting: Family Medicine

## 2015-03-09 NOTE — Telephone Encounter (Signed)
Pt called and would like the doctor to call him to discuss the bottom of his feet. jw

## 2015-03-10 NOTE — Telephone Encounter (Signed)
Called patient to discuss problem.  Reports having problems with R foot, intermittently  - Pain on the bottom at base of big toe - Started Sunday - Had some swelling - now better - Soaked and wrapped and swelling decreased - No injury  - Hasn't noted any alleviated or exacerbating factors - It is better now, but he thought he should report it because he has diabetes - no ulcers  Advised patient to rest, ice, and use ibuprofen prn for pain.  He will call and make appoitnemtn if pain worsens  Has appt for 8/10 with me - wants to talk about disability at that time   Virginia Crews, MD, MPH PGY-2,  [facility] 03/10/2015 2:11 PM

## 2015-03-18 ENCOUNTER — Ambulatory Visit (INDEPENDENT_AMBULATORY_CARE_PROVIDER_SITE_OTHER): Payer: Medicaid Other | Admitting: Pharmacist

## 2015-03-18 ENCOUNTER — Encounter: Payer: Self-pay | Admitting: Pharmacist

## 2015-03-18 VITALS — BP 116/68 | HR 67 | Temp 99.7°F | Ht 70.08 in | Wt 381.3 lb

## 2015-03-18 DIAGNOSIS — E119 Type 2 diabetes mellitus without complications: Secondary | ICD-10-CM | POA: Diagnosis present

## 2015-03-18 NOTE — Progress Notes (Signed)
Patient ID: Craig [name], male   DOB: 01-25-1961, 54 y.o.   MRN: [id number] Reviewed: Agree with Dr. Graylin Shiver documentation and management.

## 2015-03-18 NOTE — Progress Notes (Signed)
S:    Patient arrives in good spirits, walking without assistance.   Presents for diabetes follow-up.  Patient reports having history of Diabetes with much improved control with weight loss and increased activity.   Patient reports adherence with medications. Current diabetes medications include only prn use of 10 units of novolog he uses for blood glucose readings > 130.  Patient denies hypoglycemic events.  O:  Lab Results  Component Value Date   HGBA1C 5.5 12/14/2014     Home fasting CBG:  85-125 consistently.  2 hour post-prandial/random CBG: < 130 routinely.   A/P: Diabetes currently and continues to have excellent control due to improved exercise regimen and improve dietary habits.   Denies hypoglycemic events and is able to verbalize appropriate hypoglycemia management plan.  reports adherence with medication. Control is much improved with minimal use of insulin due to weight loss and increased activity.   Continue use of only short acting insulin Novolog PRN until current supply is exhausted.   Continue to encourage changes in behavior.     Assistance with medical records was provided by nursing staff at the end of the visit today.   Next A1C anticipated in 3-6 months.  Written patient instructions provided.  Follow up in Pharmacist Clinic Visit PRN.   Total time in face to face counseling 22minutes.  Patient seen with Stephens November, PharmD Resident.

## 2015-03-18 NOTE — Patient Instructions (Signed)
Thanks for visiting today!  Continue taking your medications and checking your blood sugar as usual.  Concentrate on getting to your goal weight of less than 300lbs. You're doing well!  Follow up with Dr. Brita Romp on August 10. Follow up with pharmacy clinic in September.

## 2015-03-18 NOTE — Assessment & Plan Note (Signed)
Diabetes currently and continues to have excellent control due to improved exercise regimen and improve dietary habits.   Denies hypoglycemic events and is able to verbalize appropriate hypoglycemia management plan.  reports adherence with medication. Control is much improved with minimal use of insulin due to weight loss and increased activity.   Continue use of only short acting insulin Novolog PRN until current supply is exhausted.   Continue to encourage changes in behavior.

## 2015-03-24 ENCOUNTER — Encounter: Payer: Self-pay | Admitting: Family Medicine

## 2015-03-24 ENCOUNTER — Telehealth: Payer: Self-pay | Admitting: Family Medicine

## 2015-03-24 ENCOUNTER — Ambulatory Visit (INDEPENDENT_AMBULATORY_CARE_PROVIDER_SITE_OTHER): Payer: Medicaid Other | Admitting: Family Medicine

## 2015-03-24 VITALS — BP 154/78 | HR 71 | Temp 98.3°F | Ht 70.0 in | Wt 378.0 lb

## 2015-03-24 DIAGNOSIS — M25561 Pain in right knee: Secondary | ICD-10-CM

## 2015-03-24 DIAGNOSIS — I10 Essential (primary) hypertension: Secondary | ICD-10-CM

## 2015-03-24 DIAGNOSIS — M25562 Pain in left knee: Secondary | ICD-10-CM

## 2015-03-24 DIAGNOSIS — M79671 Pain in right foot: Secondary | ICD-10-CM | POA: Diagnosis not present

## 2015-03-24 DIAGNOSIS — E119 Type 2 diabetes mellitus without complications: Secondary | ICD-10-CM | POA: Diagnosis not present

## 2015-03-24 DIAGNOSIS — Z6841 Body Mass Index (BMI) 40.0 and over, adult: Secondary | ICD-10-CM

## 2015-03-24 LAB — POCT GLYCOSYLATED HEMOGLOBIN (HGB A1C): HEMOGLOBIN A1C: 5.8

## 2015-03-24 NOTE — Progress Notes (Addendum)
   Subjective:   Craig [name] is a 54 y.o. male with a history of T2 DM, HTN, HLD, knee OA here for follow-up of diabetes and hypertension, right foot pain, and right knee pain.  T2DM: - taking Metformin 1000mg  BID, Novolog with meals prn on sliding scale per Dr. Valentina Lucks - Reports that Dr. Valentina Lucks put him back on insulin at last visit to help with mealtime sugars - reports good compliance with medications - improved exercise and diet habits - deneis any polyuria, polydipsia, hypoglycemic symptoms - home CBGs 80s-low 100s fasting, 100-110s pre-prandial  R Foot pain (resolved) - intermittent for ~3days (about 3 weeks ago) - then resolved - soaked in epsom salt - no injury - base of R great toe - swollen, not red - resolved after a few days  HTN - taking losartan-HCTZ 50-12.5mg  daily and Coreg 6.25 mg twice a day - no medication SEs, CP, SOB, HAs, LE edema - reports good compliance  Knee pain (R>L) - thinks it is arthritis - chronic problem - worse when it rains - taking ibuprofen prn which helps  Also reports that he needs a letter for lawyer to discuss disability within the next 2 weeks  Review of Systems:  Per HPI. All other systems reviewed and are negative.   PMH, PSH, Medications, Allergies, and FmHx reviewed and updated in EMR.  Social History: former smoker  Objective:  BP 154/78 mmHg  Pulse 71  Temp(Src) 98.3 F (36.8 C) (Oral)  Ht 5\' 10"  (1.778 m)  Wt 378 lb (171.46 kg)  BMI 54.24 kg/m2  Gen:  54 y.o. male in NAD HEENT: NCAT, MMM, EOMI, PERRL, anicteric sclerae CV: RRR, no MRG, intact distal pulses Resp: Non-labored, CTAB, no wheezes noted Ext: WWP, trace edema MSK: R knee: Full ROM, strength intact, +crepitus, no TTP, no instability Neuro: Alert and oriented, speech normal      Chemistry      Component Value Date/Time   NA 138 01/27/2014 1329   K 4.1 01/27/2014 1329   CL 102 01/27/2014 1329   CO2 30 01/27/2014 1329   BUN 15 01/27/2014 1329   CREATININE 1.13 01/27/2014 1329   CREATININE 1.02 02/13/2012 0329      Component Value Date/Time   CALCIUM 9.3 01/27/2014 1329   ALKPHOS 92 01/27/2014 1329   AST 19 01/27/2014 1329   ALT 22 01/27/2014 1329   BILITOT 0.3 01/27/2014 1329      Lab Results  Component Value Date   WBC 7.8 12/14/2014   HGB 12.0* 12/14/2014   HCT 37.6* 12/14/2014   MCV 85.5 12/14/2014   PLT 312 12/14/2014   Lab Results  Component Value Date   HGBA1C 5.8 03/24/2015   Assessment:     Craig [name] is a 54 y.o. male here for HTN, T2DM f/u, knee OA.    Plan:     See problem list for problem-specific plans.   Virginia Crews, MD PGY-2,  [facility] 03/24/2015  4:10 PM

## 2015-03-24 NOTE — Assessment & Plan Note (Signed)
Well-controlled with A1c 5.8 Continue diet and exercise modifications Continue metformin and NovoLog sliding scale with meals Continue to follow with Dr. Valentina Lucks Follow-up in 2 months

## 2015-03-24 NOTE — Telephone Encounter (Signed)
Please let patient know that disability letter is available to be picked up at front desk.  Thanks!  Virginia Crews, MD, MPH PGY-2,  [facility] 03/24/2015 4:17 PM

## 2015-03-24 NOTE — Assessment & Plan Note (Signed)
Encourage continued diet and exercise modifications Follow-up in 2 months

## 2015-03-24 NOTE — Assessment & Plan Note (Signed)
Unclear etiology but has resolved Encourage patient to get same-day appointment if recurs in the future

## 2015-03-24 NOTE — Assessment & Plan Note (Signed)
Exam unremarkable Likely osteoarthritis Morbid obesity contributing Encourage diet and exercise modifications and may use ibuprofen and Tylenol when necessary

## 2015-03-24 NOTE — Assessment & Plan Note (Signed)
Well controlled on manual recheck with appropriately sized cough Continue losartan- HCTZ and Coreg Check bmet in next visit  follow up in 2 months

## 2015-03-24 NOTE — Patient Instructions (Signed)
Basis you again today. Keep taking your current medications for blood pressure in your diabetes. Bring your logs for your visits. Come back to see me in 2 months to follow-up on your blood pressure Diabetes. We will get labs at that time.   Your knee pain is from arthritis. You can use Tylenol and ibuprofen as necessary to help with the pain. Weight loss and exercise are the keys to helping with arthritis.  Take care, Dr. Jacinto Reap

## 2015-03-25 NOTE — Telephone Encounter (Signed)
Left message on patient voicemail informing that letter was ready to be picked up.

## 2015-04-22 ENCOUNTER — Other Ambulatory Visit: Payer: Self-pay | Admitting: Family Medicine

## 2015-04-22 DIAGNOSIS — E119 Type 2 diabetes mellitus without complications: Secondary | ICD-10-CM

## 2015-04-22 NOTE — Telephone Encounter (Signed)
Pt pulled a muscle in his right groin. He is being seen at 11;15 with dr Valentina Lucks and would like to see dr b at the same time pleasse advise  Also [facility] on [address] told pt  Dr b needs to update all his medications

## 2015-04-23 ENCOUNTER — Encounter: Payer: Self-pay | Admitting: Pharmacist

## 2015-04-23 ENCOUNTER — Ambulatory Visit (INDEPENDENT_AMBULATORY_CARE_PROVIDER_SITE_OTHER): Payer: Medicaid Other | Admitting: Pharmacist

## 2015-04-23 VITALS — BP 140/71 | HR 67 | Wt 376.8 lb

## 2015-04-23 DIAGNOSIS — M25561 Pain in right knee: Secondary | ICD-10-CM

## 2015-04-23 DIAGNOSIS — M25562 Pain in left knee: Secondary | ICD-10-CM | POA: Diagnosis not present

## 2015-04-23 DIAGNOSIS — E119 Type 2 diabetes mellitus without complications: Secondary | ICD-10-CM | POA: Diagnosis present

## 2015-04-23 DIAGNOSIS — B353 Tinea pedis: Secondary | ICD-10-CM

## 2015-04-23 DIAGNOSIS — I10 Essential (primary) hypertension: Secondary | ICD-10-CM

## 2015-04-23 MED ORDER — ATORVASTATIN CALCIUM 40 MG PO TABS: 40.0000 mg | ORAL_TABLET | Freq: Every day | ORAL | Status: DC

## 2015-04-23 MED ORDER — LOSARTAN POTASSIUM-HCTZ 50-12.5 MG PO TABS: 1.0000 | ORAL_TABLET | Freq: Every day | ORAL | Status: DC

## 2015-04-23 MED ORDER — LANCETS ULTRA THIN 30G MISC: Status: DC

## 2015-04-23 MED ORDER — TERBINAFINE HCL 1 % EX CREA: 1.0000 "application " | TOPICAL_CREAM | Freq: Two times a day (BID) | CUTANEOUS | Status: DC

## 2015-04-23 MED ORDER — IBUPROFEN 800 MG PO TABS: ORAL_TABLET | ORAL | Status: DC

## 2015-04-23 MED ORDER — GLUCOSE BLOOD VI STRP: 1.0000 | ORAL_STRIP | Status: DC | PRN

## 2015-04-23 MED ORDER — INSULIN ASPART 100 UNIT/ML FLEXPEN: 10.0000 [IU] | PEN_INJECTOR | Freq: Three times a day (TID) | SUBCUTANEOUS | Status: DC

## 2015-04-23 MED ORDER — AMLODIPINE BESYLATE 2.5 MG PO TABS: 2.5000 mg | ORAL_TABLET | Freq: Every day | ORAL | Status: DC

## 2015-04-23 MED ORDER — ASPIRIN 81 MG PO TBEC: DELAYED_RELEASE_TABLET | ORAL | Status: DC

## 2015-04-23 MED ORDER — CARVEDILOL 6.25 MG PO TABS: ORAL_TABLET | ORAL | Status: DC

## 2015-04-23 MED ORDER — PANTOPRAZOLE SODIUM 40 MG PO TBEC: DELAYED_RELEASE_TABLET | ORAL | Status: DC

## 2015-04-23 MED ORDER — INSULIN PEN NEEDLE 31G X 8 MM MISC: Status: DC

## 2015-04-23 MED ORDER — METFORMIN HCL 1000 MG PO TABS: 1000.0000 mg | ORAL_TABLET | Freq: Two times a day (BID) | ORAL | Status: DC

## 2015-04-23 NOTE — Assessment & Plan Note (Signed)
Refill provided- per patient request °

## 2015-04-23 NOTE — Assessment & Plan Note (Addendum)
Refills provided at patient request.

## 2015-04-23 NOTE — Patient Instructions (Signed)
Great to see you today. Be as active as you can tolerate. Continue current medications. Call next week if your pain continues to worsen and you need evaluation.  Follow up with Dr. Brita Romp in October.

## 2015-04-23 NOTE — Assessment & Plan Note (Addendum)
Diabetes currently well controlled.  Patient denies hypoglycemic events and is able to verbalize appropriate hypoglycemia management plan.  reports adherence with medication.  Continued rapid insulin Novolog (insulin aspart) 10-15 units prior to large meals. Next A1C anticipated 5 months.  Written patient instructions provided.

## 2015-04-23 NOTE — Progress Notes (Signed)
Patient ID: Craig [name], male   DOB: 09/06/1960, 54 y.o.   MRN: [id number] Reviewed: Agree with Dr. Koval's documentation and management. 

## 2015-04-23 NOTE — Assessment & Plan Note (Signed)
Refilled ibuprofen per patient request. BMET next lab draw recommended.

## 2015-04-23 NOTE — Assessment & Plan Note (Addendum)
Refills provided at patient request. BP at goal.

## 2015-04-23 NOTE — Telephone Encounter (Signed)
I am seeing other patients at that time.  Patient should make a same day appt to discuss muscle strain.  Virginia Crews, MD, MPH PGY-2,  [facility] 04/23/2015 8:39 AM

## 2015-04-23 NOTE — Progress Notes (Signed)
S:    Patient arrives in good spirits, ambulating without assistance. Patient complains of right lower abdominal pain since Tuesday. Pain started with exertion and has improved slightly since that time.     Presents for diabetes follow-up.   Patient reports adherence with medications. Current diabetes medications include metformin 1000mg  BID and Novolog 10-15 units prior to large meals.   Patient denies hypoglycemic events.  Patient reported dietary habits: Significant attempts to eat healthy, eating home-cooked meals and salads when eating out.  Patient reported exercise habits: Limited due to abdominal pain in the last few days.    O:  . Lab Results  Component Value Date   HGBA1C 5.8 03/24/2015     Home fasting CBG: 100-110   A/P: Diabetes currently well controlled.  Patient denies hypoglycemic events and is able to verbalize appropriate hypoglycemia management plan.  reports adherence with medication.  Continued rapid insulin Novolog (insulin aspart) 10-15 units prior to large meals. Next A1C anticipated 5 months.  Written patient instructions provided.  Follow up in Pharmacist Clinic Visit PRN alternating with PCP.   Total time in face to face counseling 20 minutes.  Patient seen with Roxy Manns, PharmD Candidate and Elisabeth Most, PharmD, resident.   Consulted with PCP and attending physician reviewed symptom of lower right abdominal pain, likely musculoskeletal. Reviewed treatment plan. Follow-up in 3-5 days if patient continues to have pain. Patient agrees with treatment plan: Ibuprofen 800mg  up to BID  Refilled current chronic meds at patient's request.

## 2015-05-18 LAB — HM DIABETES EYE EXAM

## 2015-05-28 ENCOUNTER — Ambulatory Visit: Payer: Medicaid Other | Admitting: Family Medicine

## 2015-06-06 ENCOUNTER — Other Ambulatory Visit: Payer: Self-pay | Admitting: Family Medicine

## 2015-06-16 ENCOUNTER — Encounter: Payer: Self-pay | Admitting: Family Medicine

## 2015-06-16 ENCOUNTER — Ambulatory Visit (INDEPENDENT_AMBULATORY_CARE_PROVIDER_SITE_OTHER): Payer: Medicaid Other | Admitting: Family Medicine

## 2015-06-16 VITALS — BP 124/78 | HR 73 | Temp 98.6°F | Ht 70.0 in | Wt 379.0 lb

## 2015-06-16 DIAGNOSIS — E785 Hyperlipidemia, unspecified: Secondary | ICD-10-CM | POA: Diagnosis not present

## 2015-06-16 DIAGNOSIS — E118 Type 2 diabetes mellitus with unspecified complications: Secondary | ICD-10-CM

## 2015-06-16 DIAGNOSIS — I10 Essential (primary) hypertension: Secondary | ICD-10-CM

## 2015-06-16 DIAGNOSIS — Z23 Encounter for immunization: Secondary | ICD-10-CM

## 2015-06-16 DIAGNOSIS — M722 Plantar fascial fibromatosis: Secondary | ICD-10-CM

## 2015-06-16 LAB — POCT GLYCOSYLATED HEMOGLOBIN (HGB A1C): HEMOGLOBIN A1C: 5.7

## 2015-06-16 LAB — LIPID PANEL
Cholesterol: 136 mg/dL (ref 125–200)
HDL: 32 mg/dL — ABNORMAL LOW
LDL Cholesterol: 91 mg/dL
Total CHOL/HDL Ratio: 4.3 ratio
Triglycerides: 65 mg/dL
VLDL: 13 mg/dL

## 2015-06-16 LAB — BASIC METABOLIC PANEL WITHOUT GFR
BUN: 10 mg/dL (ref 7–25)
CO2: 31 mmol/L (ref 20–31)
Calcium: 8.6 mg/dL (ref 8.6–10.3)
Chloride: 101 mmol/L (ref 98–110)
Creat: 0.99 mg/dL (ref 0.70–1.33)
GFR, Est African American: >89 mL/min
GFR, Est Non African American: 87 mL/min
Glucose, Bld: 89 mg/dL (ref 65–99)
Potassium: 4.1 mmol/L (ref 3.5–5.3)
Sodium: 139 mmol/L (ref 135–146)

## 2015-06-16 NOTE — Assessment & Plan Note (Signed)
Check lipids today Continue lipitor

## 2015-06-16 NOTE — Assessment & Plan Note (Signed)
Well controlled Continue current meds Can likely decrease insulin use in the near future Next A1c in 3-6 months

## 2015-06-16 NOTE — Patient Instructions (Signed)
Nice to see you again today.  Continue taking your current medications.  We will get some lab work today and someone will call you or send you a letter with the results when they are available.  I think your foot pain is from plantar fasciitis - see below.  Take care, Dr. B  Plantar Fasciitis Plantar fasciitis is a painful foot condition that affects the heel. It occurs when the band of tissue that connects the toes to the heel bone (plantar fascia) becomes irritated. This can happen after exercising too much or doing other repetitive activities (overuse injury). The pain from plantar fasciitis can range from mild irritation to severe pain that makes it difficult for you to walk or move. The pain is usually worse in the morning or after you have been sitting or lying down for a while. CAUSES This condition may be caused by:  Standing for long periods of time.  Wearing shoes that do not fit.  Doing high-impact activities, including running, aerobics, and ballet.  Being overweight.  Having an abnormal way of walking (gait).  Having tight calf muscles.  Having high arches in your feet.  Starting a new athletic activity. SYMPTOMS The main symptom of this condition is heel pain. Other symptoms include:  Pain that gets worse after activity or exercise.  Pain that is worse in the morning or after resting.  Pain that goes away after you walk for a few minutes. DIAGNOSIS This condition may be diagnosed based on your signs and symptoms. Your health care provider will also do a physical exam to check for:  A tender area on the bottom of your foot.  A high arch in your foot.  Pain when you move your foot.  Difficulty moving your foot. You may also need to have imaging studies to confirm the diagnosis. These can include:  X-rays.  Ultrasound.  MRI. TREATMENT  Treatment for plantar fasciitis depends on the severity of the condition. Your treatment may include:  Rest, ice, and  over-the-counter pain medicines to manage your pain.  Exercises to stretch your calves and your plantar fascia.  A splint that holds your foot in a stretched, upward position while you sleep (night splint).  Physical therapy to relieve symptoms and prevent problems in the future.  Cortisone injections to relieve severe pain.  Extracorporeal shock wave therapy (ESWT) to stimulate damaged plantar fascia with electrical impulses. It is often used as a last resort before surgery.  Surgery, if other treatments have not worked after 12 months. HOME CARE INSTRUCTIONS  Take medicines only as directed by your health care provider.  Avoid activities that cause pain.  Roll the bottom of your foot over a bag of ice or a bottle of cold water. Do this for 20 minutes, 3-4 times a day.  Perform simple stretches as directed by your health care provider.  Try wearing athletic shoes with air-sole or gel-sole cushions or soft shoe inserts.  Wear a night splint while sleeping, if directed by your health care provider.  Keep all follow-up appointments with your health care provider. PREVENTION   Do not perform exercises or activities that cause heel pain.  Consider finding low-impact activities if you continue to have problems.  Lose weight if you need to. The best way to prevent plantar fasciitis is to avoid the activities that aggravate your plantar fascia. SEEK MEDICAL CARE IF:  Your symptoms do not go away after treatment with home care measures.  Your pain gets worse.  Your pain affects your ability to move or do your daily activities.   This information is not intended to replace advice given to you by your health care provider. Make sure you discuss any questions you have with your health care provider.   Document Released: 04/25/2001 Document Revised: 04/21/2015 Document Reviewed: 06/10/2014 Elsevier Interactive Patient Education [employer].

## 2015-06-16 NOTE — Progress Notes (Signed)
   Subjective:   Craig [name] is a 54 y.o. male with a history of T2DM, HTN, HLD, knee OA here for L foot pain and T2DM f/u.  L foot pain - pain near heel - has been soaking in "alcohol sport" - no swelling, no redness - feels like "pinched nerve" - intermittently for about a month - worse in morning - walking makes it better - ibuprofen doesn't take pain away  T2DM - taking Metformin 1000mg  BID, Novolog with meals prn on sliding scale per Dr. Valentina Lucks - only havign to take 5-10 units of Novolog at dinnertime and no other times - reports good compliance with medications - improving exercise and diet habits - deneis any polyuria, polydipsia, hypoglycemic symptoms - home CBGs 80s-low 100s fasting, 100-120s pre-prandial  Review of Systems:  Per HPI. All other systems reviewed and are negative.   PMH, PSH, Medications, Allergies, and FmHx reviewed and updated in EMR.  Social History: former smoker  Objective:  BP 124/78 mmHg  Pulse 73  Temp(Src) 98.6 F (37 C) (Oral)  Ht 5\' 10"  (1.778 m)  Wt 379 lb (171.913 kg)  BMI 54.38 kg/m2  SpO2 96%  Gen:  54 y.o. male in NAD HEENT: NCAT, MMM, EOMI, PERRL, anicteric sclerae CV: RRR, no MRG Resp: Non-labored, CTAB, no wheezes noted Ext: WWP, no edema MSK: L foot: sensation intact, TTP over anterior calcaneous, ROM intact Neuro: Alert and oriented, speech normal      Chemistry      Component Value Date/Time   NA 138 01/27/2014 1329   K 4.1 01/27/2014 1329   CL 102 01/27/2014 1329   CO2 30 01/27/2014 1329   BUN 15 01/27/2014 1329   CREATININE 1.13 01/27/2014 1329   CREATININE 1.02 02/13/2012 0329      Component Value Date/Time   CALCIUM 9.3 01/27/2014 1329   ALKPHOS 92 01/27/2014 1329   AST 19 01/27/2014 1329   ALT 22 01/27/2014 1329   BILITOT 0.3 01/27/2014 1329      Lab Results  Component Value Date   WBC 7.8 12/14/2014   HGB 12.0* 12/14/2014   HCT 37.6* 12/14/2014   MCV 85.5 12/14/2014   PLT 312 12/14/2014     Lab Results  Component Value Date   HGBA1C 5.7 06/16/2015   Assessment & Plan:     Craig [name] is a 54 y.o. male here for T2DM f/u and L foot pain c/w plantar fasciitis  Hyperlipidemia Check lipids today Continue lipitor  Type 2 diabetes mellitus, controlled Well controlled Continue current meds Can likely decrease insulin use in the near future Next A1c in 3-6 months  Hypertension goal BP (blood pressure) < 140/90 Well controlled BMET today in setting of ibuprofen use  Plantar fasciitis Symptoms c/w plantar fasciitis Continue ibuprofen Exercises given Consider referral to sports medicine if no improvement    Virginia Crews, MD MPH PGY-2,  [facility] 06/16/2015  9:31 AM

## 2015-06-16 NOTE — Assessment & Plan Note (Signed)
Symptoms c/w plantar fasciitis Continue ibuprofen Exercises given Consider referral to sports medicine if no improvement

## 2015-06-16 NOTE — Assessment & Plan Note (Signed)
Well controlled BMET today in setting of ibuprofen use

## 2015-06-17 ENCOUNTER — Encounter: Payer: Self-pay | Admitting: Family Medicine

## 2015-08-31 ENCOUNTER — Telehealth: Payer: Self-pay | Admitting: *Deleted

## 2015-08-31 NOTE — Telephone Encounter (Signed)
Prior Authorization received from [facility] for Losartan-HCTZ 50-12.5 mg. Received PA approval for Losartan-HCTZ 50-12.5 mg via [employer].  Med approved for 08/31/15 - 08/30/16.  Patient has tried and failed Lisinopril 10 mg and Lisinopril-HCTZ 20-25 mg. [facility] informed.  PA approval number [id number]. Derl Barrow, RN

## 2015-09-21 ENCOUNTER — Ambulatory Visit: Payer: Medicaid Other | Admitting: Family Medicine

## 2015-09-28 ENCOUNTER — Ambulatory Visit (INDEPENDENT_AMBULATORY_CARE_PROVIDER_SITE_OTHER): Payer: Medicaid Other | Admitting: Family Medicine

## 2015-09-28 ENCOUNTER — Encounter: Payer: Self-pay | Admitting: Family Medicine

## 2015-09-28 VITALS — BP 120/77 | HR 92 | Temp 98.3°F | Ht 70.0 in | Wt 363.7 lb

## 2015-09-28 DIAGNOSIS — I10 Essential (primary) hypertension: Secondary | ICD-10-CM | POA: Diagnosis not present

## 2015-09-28 DIAGNOSIS — E118 Type 2 diabetes mellitus with unspecified complications: Secondary | ICD-10-CM

## 2015-09-28 DIAGNOSIS — E119 Type 2 diabetes mellitus without complications: Secondary | ICD-10-CM | POA: Diagnosis present

## 2015-09-28 LAB — POCT GLYCOSYLATED HEMOGLOBIN (HGB A1C): Hemoglobin A1C: 5.7

## 2015-09-28 MED ORDER — PANTOPRAZOLE SODIUM 40 MG PO TBEC: DELAYED_RELEASE_TABLET | ORAL | Status: DC

## 2015-09-28 MED ORDER — ASPIRIN 81 MG PO TBEC: DELAYED_RELEASE_TABLET | ORAL | Status: DC

## 2015-09-28 MED ORDER — ATORVASTATIN CALCIUM 40 MG PO TABS: 40.0000 mg | ORAL_TABLET | Freq: Every day | ORAL | Status: DC

## 2015-09-28 MED ORDER — LOSARTAN POTASSIUM-HCTZ 50-12.5 MG PO TABS: 1.0000 | ORAL_TABLET | Freq: Every day | ORAL | Status: DC

## 2015-09-28 MED ORDER — CARVEDILOL 6.25 MG PO TABS: ORAL_TABLET | ORAL | Status: DC

## 2015-09-28 MED ORDER — AMLODIPINE BESYLATE 2.5 MG PO TABS: 2.5000 mg | ORAL_TABLET | Freq: Every day | ORAL | Status: DC

## 2015-09-28 MED ORDER — METFORMIN HCL 1000 MG PO TABS: 1000.0000 mg | ORAL_TABLET | Freq: Two times a day (BID) | ORAL | Status: DC

## 2015-09-28 NOTE — Patient Instructions (Signed)
Nice to see you again today. Your A1c is great at 5.7. Continue taking your current medications. I will see back in 3 months to follow-up on your blood pressure and diabetes again.  Take care, Dr. Jacinto Reap

## 2015-09-28 NOTE — Progress Notes (Signed)
Patient ID: Craig [name], male   DOB: 1961-05-21, 55 y.o.   MRN: [id number] Subjective:   HPI This history was provided by the patient.  Craig [name] is a 55 y.o. 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  Patient presents to clinic for follow-up regarding DM type 2 and HTN.  Patient also mentioned sharp intermittent right foot pain that is not worse with movement.  Patient states the pain was initially accompanied by swelling, but that resolved when he reduced sodium intake.  Patient now occasionally has right foot pain, but no swelling.      HTN: - Medications: Amlodipine, Carvedilol, Losartan/HCTZ - Compliance: Yes, takes as prescribed daily - Checking BP at home: No - Denies any SOB, CP, vision changes, headaches, LE edema, or symptoms of hypotension, N/V/D.  Patient states he has occasional dizziness with standing.  Patient's last opthalmology visit was September 2016. - Diet: Trying to reduce sodium in diet.  Taking a supplement called Thrive M capsule, shakes and patch for weight loss and energy.  High protein diet, grilled or baked chicken.  No fried foods.  Salads, broccoli, other vegetables.   - Exercise: [employer] cardio workout and [employer] total gym machine, water aerobics, bowling  T2DM - Checking BG at home:  Yes, 3 times a day.  High in last 6 weeks 178, low 72 - Medications: Insulin 10-15 units as needed if CBG is 130 or >, Metformin 1,000 mg BID.  Patient states he rarely has to take insulin. - Compliance: Yes - Diet: Trying to reduce sodium in diet.  Taking a supplement called Thrive M capsule, shakes and patch for weight loss and energy.  High protein diet, grilled or baked chicken.  No fried foods.  Salads, broccoli, other vegetables.   - Exercise: [employer] cardio workout and [employer] total gym machine, water aerobics, bowling  Right Foot Pain - 2 months of right great pain which patient states is associated with eating too much sodium -  Initially swollen and warm to touch along great toe - Patient treated with 800 mg ibuprofen with some relief    Review of Systems:  Per HPI. All other systems reviewed and are negative.   PMH, PSH, Medications, Allergies, and FmHx reviewed and updated in EMR.  Social History: former smoker, quit 1998  Objective:  BP 120/77 mmHg  Pulse 92  Temp(Src) 98.3 F (36.8 C) (Oral)  Ht 5\' 10"  (1.778 m)  Wt 363 lb 11.2 oz (164.973 kg)  BMI 52.19 kg/m2  General: 55 y.o. male Awake, alert, obese, NAD and non-toxic in appearance HEENT:    Neck: No masses palpated. No LAD    Throat: MMM, no erythema Cardio: RRR, S1S2 heard, no murmurs appreciated, no lower extremity edema, cyanosis or clubbing; +2 radial and +2 dorsalis lateralis pulses bilaterally Pulm: Clear to auscultation bilaterally, no wheezes, rhonchi or rales GI: Soft, obese, not tender, no distension Extremities: MAE MSK: Normal gait and station, no arthralgias.  No edema, rubor or warmth to RLE, +2 pulses RLE.   Skin: dry, intact, no rashes or lesions Neuro: Strength and sensation grossly intact and equal bilaterally, EOMI, PERRL 36mm  Assessment & Plan:  Craig [name] is a 55 y.o. male here for follow-up regarding DM type 2 and HTN.  Patient's A1C is 5.7 today, indicating well controlled diabetes.  Discussed discontinuation of Novolog with patient, but he would prefer to continue it as needed.  Patient's BP  is well controlled and he denies symptoms of hypertension.  Patient has been exercising and taking a supplement to lose weight.  These efforts seem to be paying off and he has lost 15 pounds since November of last year.    Patient mentioned right foot pain that has resolved.  He indicated the episode began 2 months ago and initially the metatarsal joint of the right great toe was swollen, red and painful.  The pain and swelling were relieved by use of ibuprofen.  Patient is currently pain free and his right foot is not edematous or  erythematous.  His reported history is consistent with gout and he suspects that is what it was.  He will follow-up in clinic should he have a repeat episode.  Will continue Metformin and and Novolog as currently prescribed.  Continue Amlodipine, Carvedilol and Hyzaar as currently prescribed.  Refills placed on all meds.  1. Type 2 diabetes mellitus without complication, unspecified long term insulin use status (HCC) - HgB A1c  2. Hypertension goal BP (blood pressure) < 140/90 - losartan-hydrochlorothiazide (HYZAAR) 50-12.5 MG tablet; Take 1 tablet by mouth daily.  Dispense: 90 tablet; Refill: 3 - aspirin 81 MG EC tablet; TAKE 1 TABLET (81 MG TOTAL) BY MOUTH DAILY.  Dispense: 90 tablet; Refill: 3 - amLODipine (NORVASC) 2.5 MG tablet; Take 1 tablet (2.5 mg total) by mouth daily.  Dispense: 90 tablet; Refill: 3 - carvedilol (COREG) 6.25 MG tablet; TAKE 1 TABLET (6.25 MG TOTAL) BY MOUTH 2 (TWO) TIMES DAILY.  Dispense: 180 tablet; Refill: 2  3. Controlled type 2 diabetes mellitus without complication, unspecified long term insulin use status (HCC) - metFORMIN (GLUCOPHAGE) 1000 MG tablet; Take 1 tablet (1,000 mg total) by mouth 2 (two) times daily with a meal.  Dispense: 180 tablet; Refill: 3 - atorvastatin (LIPITOR) 40 MG tablet; Take 1 tablet (40 mg total) by mouth daily.  Dispense: 90 tablet; Refill: 3  Patient to follow-up with PCP in 3 months per his preference.     Sherron Ales, NP [facility] 09/28/2015 2:47 PM

## 2015-09-28 NOTE — Assessment & Plan Note (Signed)
Well controlled, A1c 5.7 Continue current meds Offered patient to stop sliding scale insulin - but he reports he likes to feel like he can do something if BG is high No hypoglycemia F/u in 3 months

## 2015-09-28 NOTE — Assessment & Plan Note (Signed)
Well controlled. Continue current medication regimen.  

## 2015-10-08 ENCOUNTER — Ambulatory Visit (INDEPENDENT_AMBULATORY_CARE_PROVIDER_SITE_OTHER): Payer: Medicaid Other | Admitting: Pharmacist

## 2015-10-08 ENCOUNTER — Encounter: Payer: Self-pay | Admitting: Pharmacist

## 2015-10-08 VITALS — BP 155/96 | HR 63 | Wt 364.0 lb

## 2015-10-08 DIAGNOSIS — I10 Essential (primary) hypertension: Secondary | ICD-10-CM | POA: Diagnosis not present

## 2015-10-08 DIAGNOSIS — M25561 Pain in right knee: Secondary | ICD-10-CM

## 2015-10-08 DIAGNOSIS — E118 Type 2 diabetes mellitus with unspecified complications: Secondary | ICD-10-CM

## 2015-10-08 DIAGNOSIS — M25562 Pain in left knee: Secondary | ICD-10-CM | POA: Diagnosis not present

## 2015-10-08 MED ORDER — AMLODIPINE BESYLATE 5 MG PO TABS: 5.0000 mg | ORAL_TABLET | Freq: Every evening | ORAL | Status: DC

## 2015-10-08 MED ORDER — IBUPROFEN 800 MG PO TABS
ORAL_TABLET | ORAL | Status: DC
Start: 2015-10-08 — End: 2017-02-18

## 2015-10-08 NOTE — Assessment & Plan Note (Signed)
Hypertension longstandig currently above goal with 4 drug regimen.  Patient reports adherence with medication however he was taking his carvedilol two tablets once daily.  He was reeducated on need to take carvedilol BID.  Increased amlodipine to 5mg  daily (QPM) - patient will take 2.5mg  tablets two at a time until gone.

## 2015-10-08 NOTE — Assessment & Plan Note (Signed)
Diabetes longstanding currently with excellent glydemic control Patient denies hypoglycemic events and is able to verbalize appropriate hypoglycemia management plan. Patient reports adherence with medication. No change in therapy at this time. Continue metformin 1000mg  BID and PRN use of Novolog (using 2-5 doses pre-meal per week)  Continued Aspirin 81 mg and Continued atorvastatin 40 mg.

## 2015-10-08 NOTE — Patient Instructions (Signed)
Great to see you again!  Take two amlodipine daily until gone.   Then start taking amlodipine 5mg  daily at night!  Please take your carvedilol ONE pill twice daily.   Follow up in Pharmacy Clinic -  In early April - Call in 2 weeks to make an appointment.

## 2015-10-08 NOTE — Progress Notes (Signed)
S:    Patient arrives in usual bright spirits. Presents for diabetes evaluation, education, and management at the request of Dr. Brita Romp.    Patient reports adherence with medications.  Current diabetes medications include: Novolog low dose with 0 , 10 or 15 units based on CBGs.  Current hypertension medications include:   Patient denies hypoglycemic events.  Patient reported dietary habits: Eats 2-3 meals/day Attempting to limit salt intake.   He believe salt caused a flare of his right big toe which has recovered with limiting salt intake.   Patient reported exercise habits:  Total gym twice daily and walking.      O:  Lab Results  Component Value Date   HGBA1C 5.7 09/28/2015   Filed Vitals:   10/08/15 1001  BP: 155/96  Pulse: 63    Home fasting CBG: 80-130 with majority of readins < 100 Prior to evening meal readings:  90-140 with majority of readings 90-115  A/P: Diabetes longstanding currently with excellent glydemic control Patient denies hypoglycemic events and is able to verbalize appropriate hypoglycemia management plan. Patient reports adherence with medication. No change in therapy at this time. Continue metformin 1000mg  BID and PRN use of Novolog (using 2-5 doses pre-meal per week)  Continued Aspirin 81 mg and Continued atorvastatin 40 mg.   Hypertension longstandig currently above goal with 4 drug regimen.  Patient reports adherence with medication however he was taking his carvedilol two tablets once daily.  He was reeducated on need to take carvedilol BID.  Increased amlodipine to 5mg  daily (QPM) - patient will take 2.5mg  tablets two at a time until gone.    Refilled ibuprofen 800mg  which he uses once daily for knee pain.    Consideration for next visit - Lipid panel,  Consider uric acid if foot pain recurs.  BMET to reevaluate sodium.  Written patient instructions provided.  Total time in face to face counseling 30 minutes.   Follow up in Pharmacist Clinic  Visit in early.   Patient seen with Phillis Knack, PharmD Candidate.

## 2015-10-11 NOTE — Progress Notes (Signed)
Patient ID: Craig [name], male   DOB: 10/05/1960, 54 y.o.   MRN: [id number] Reviewed: Agree with Dr. Koval's documentation and management. 

## 2015-12-06 ENCOUNTER — Ambulatory Visit (INDEPENDENT_AMBULATORY_CARE_PROVIDER_SITE_OTHER): Payer: Medicaid Other | Admitting: Pharmacist

## 2015-12-06 ENCOUNTER — Encounter: Payer: Self-pay | Admitting: Pharmacist

## 2015-12-06 VITALS — BP 130/61 | HR 58 | Wt 358.0 lb

## 2015-12-06 DIAGNOSIS — I10 Essential (primary) hypertension: Secondary | ICD-10-CM

## 2015-12-06 DIAGNOSIS — E118 Type 2 diabetes mellitus with unspecified complications: Secondary | ICD-10-CM

## 2015-12-06 DIAGNOSIS — E785 Hyperlipidemia, unspecified: Secondary | ICD-10-CM

## 2015-12-06 MED ORDER — ATORVASTATIN CALCIUM 80 MG PO TABS: 80.0000 mg | ORAL_TABLET | Freq: Every day | ORAL | Status: DC

## 2015-12-06 NOTE — Patient Instructions (Addendum)
Increase your Lipitor (atorvastatin) to 80 mg daily. Take 2 of your 40 mg tablets at the same time daily until they are gone. We will send a new prescription for 80 mg daily to your pharmacy for you to pick up when your tablets are gone.  Continue to take your amlodipine 5 mg at night. You have 2.5 mg tablets left, so take 2 of the 2.5 mg tablets every night until they are gone, then continue to take one 5 mg tablet at night as you have been doing.  We will see you again during the 2nd week of June. You should also see Dr. Brita Romp in late June or early July.

## 2015-12-06 NOTE — Assessment & Plan Note (Signed)
Hypertension: longstanding, currently at goal with 4 drug regimen. Patient reports adherence with medication and has been doing well with cutting salt out of his diet. Pt had only trace edema in clinic and reports some swelling that "comes and goes" at home. He reports controlled BPs and HRs in the 90s at home, however in clinic his HR has been 60s-70s for several months. Continued current medications (amlodipine 5 mg daily, carvedilol 6.25 mg BID, losartan-HCTZ 50/12.5 mg daily). Pt was instructed to take 2 amlodipine 2.5 mg tablets daily to exhaust his supply, then resume taking his 5 mg tablets daily. At next visit, consider decreasing carvedilol to 3.125 mg BID, increasing losartan to 100 mg daily and stopping the HCTZ which may improve electrolyte and metabolic effects of HCTZ.

## 2015-12-06 NOTE — Progress Notes (Signed)
S:    Patient arrives in great spirits, feeling well. He states his knees are hurting a little bit more lately, especially with the rain today.  He presents for diabetes evaluation, education, and management at the request of Dr. Brita Romp, who last saw him on 09/28/2015.   Patient reports adherence with medications and denies hypoglycemic events.  Current diabetes medications: metformin 1000 mg BID, insulin aspart- continues to take 10-15 units  ~2x per month when his diet justifies some additional insulin.  Current hypertension medications: amlodipine 5 mg daily, carvedilol 6.25 mg BID, losartan-HCTZ 50/12.5 mg daily  Patient reported dietary habits: Eats 1 meal/day Breakfast: Glucerna shake Lunch: Glucerna shake  Dinner: big meal between 6-7 pm  Patient reported exercise habits: walking, bowling, [employer] 4 nights per week  O:  Lab Results  Component Value Date   HGBA1C 5.7 09/28/2015   Filed Vitals:   12/06/15 1024 12/06/15 1027  BP: 168/141 130/61  Pulse: 62 58   2 hour post-prandial/random CBG: 80s-120s. Majority of readings are <100  10 year ASCVD risk: 19.1%.  A/P:  Diabetes: longstanding, currently with excellent control. Patient denies hypoglycemic events and is able to verbalize appropriate hypoglycemia management plan. Patient reports adherence with medication. Continued metformin 1000 mg BID and insulin aspart PRN (patient preference).  He is unwilling to stop his insulin PRN and states he has ~ 5 pens remaining.   Dyslipidemia: ASCVD risk: 19.1%. Continued Aspirin 81 mg and Increased dose of atorvastatin to 80 mg. Pt was instructed to take 2 atorvastatin 40 mg tablets daily until gone and a new prescription was sent to his pharmacy.   LDL has trended up and he is becoming more sedentary with knew pain.  In an attempt to maintain LDL with 50% reduction and LDL near or less than 70 we increased the atorvastatin dose today.   Reevaluate LDL with upcoming lab draw.   Next  A1C and lipid panel anticipated at next visit in June 2017.    Hypertension: longstanding, currently at goal with 4 drug regimen. Patient reports adherence with medication and has been doing well with cutting salt out of his diet. Pt had only trace edema in clinic and reports some swelling that "comes and goes" at home. He reports controlled BPs and HRs in the 90s at home, however in clinic his HR has been 60s-70s for several months. Continued current medications (amlodipine 5 mg daily, carvedilol 6.25 mg BID, losartan-HCTZ 50/12.5 mg daily). Pt was instructed to take 2 amlodipine 2.5 mg tablets daily to exhaust his supply, then resume taking his 5 mg tablets daily. At next visit, consider decreasing carvedilol to 3.125 mg BID, increasing losartan to 100 mg daily and stopping the HCTZ which may improve electrolyte and metabolic effects of HCTZ.   Written patient instructions provided.  Total time in face to face counseling 40 minutes.   Follow up in Pharmacist Clinic in June and with Dr. Brita Romp in late June/early July.   Patient seen with Rudolpho Sevin, PharmD Candidate and Governor Specking, PharmD Resident.

## 2015-12-06 NOTE — Progress Notes (Signed)
Patient ID: Craig [name], male   DOB: 06/26/1961, 54 y.o.   MRN: [id number] Reviewed: Agree with Dr. Koval's documentation and management. 

## 2015-12-06 NOTE — Assessment & Plan Note (Signed)
Diabetes: longstanding, currently with excellent control. Patient denies hypoglycemic events and is able to verbalize appropriate hypoglycemia management plan. Patient reports adherence with medication. Continued metformin 1000 mg BID and insulin aspart PRN (patient preference).  He is unwilling to stop his insulin PRN and states he has ~ 5 pens remaining.

## 2015-12-06 NOTE — Assessment & Plan Note (Signed)
Dyslipidemia: ASCVD risk: 19.1%. Continued Aspirin 81 mg and Increased dose of atorvastatin to 80 mg. Pt was instructed to take 2 atorvastatin 40 mg tablets daily until gone and a new prescription was sent to his pharmacy.   LDL has trended up and he is becoming more sedentary with knew pain.  In an attempt to maintain LDL with 50% reduction and LDL near or less than 70 we increased the atorvastatin dose today.   Reevaluate LDL with upcoming lab draw.

## 2016-01-31 ENCOUNTER — Ambulatory Visit: Payer: Medicaid Other | Admitting: Pharmacist

## 2016-02-04 ENCOUNTER — Ambulatory Visit (INDEPENDENT_AMBULATORY_CARE_PROVIDER_SITE_OTHER): Payer: Medicaid Other | Admitting: Family Medicine

## 2016-02-04 ENCOUNTER — Encounter: Payer: Self-pay | Admitting: Family Medicine

## 2016-02-04 VITALS — BP 138/77 | HR 66 | Temp 98.2°F | Ht 70.0 in | Wt 369.6 lb

## 2016-02-04 DIAGNOSIS — E118 Type 2 diabetes mellitus with unspecified complications: Secondary | ICD-10-CM | POA: Diagnosis present

## 2016-02-04 DIAGNOSIS — E785 Hyperlipidemia, unspecified: Secondary | ICD-10-CM

## 2016-02-04 DIAGNOSIS — I10 Essential (primary) hypertension: Secondary | ICD-10-CM

## 2016-02-04 DIAGNOSIS — Z6841 Body Mass Index (BMI) 40.0 and over, adult: Secondary | ICD-10-CM | POA: Diagnosis not present

## 2016-02-04 LAB — POCT GLYCOSYLATED HEMOGLOBIN (HGB A1C): Hemoglobin A1C: 5.7

## 2016-02-04 NOTE — Assessment & Plan Note (Signed)
Discussed diet and exercise 

## 2016-02-04 NOTE — Assessment & Plan Note (Signed)
Tolerating Lipitor 80 daily Check direct LDL today with help of 50% reduction or at or near goal of 70

## 2016-02-04 NOTE — Patient Instructions (Addendum)
Nice to see you again today. Your diabetes is well controlled. A1c is 5.7. Continue current medications for your diabetes. If at your next visit your A1c is still well controlled, we will stop the as needed insulin.  For your blood pressure, continue meds at current doses.  Follow-up in one month for BP.  We are getting some labs today and someone will call you or send you a letter with the results when they're available.  Take care,  Dr. Jacinto Reap

## 2016-02-04 NOTE — Progress Notes (Signed)
   Subjective:   Craig [name] is a 55 y.o. male with a history of T2 DM, HTN, HLD here for chronic disease management  T2DM - Checking BG at home: yes - fasting 80s-100s - Medications: Metformin 1000 mg twice daily, aspart insulin as needed (using this approximately 3-4 times per month) - Compliance: good - Diet: up and down - eating a lot of fruit, avoiding fried foods - Exercise: walking or bowling daily - looking at joining gym - eye exam: 05/2015 - foot exam: needs today  - microalbumin: n/a, ARB - denies symptoms of hypoglycemia, polyuria, polydipsia  HTN: - Medications: Amlodipine 5 mg daily, carvedilol 6.25 mg twice a day, losartan HCTZ 50-12.5 mg daily - Compliance: good - Checking BP at home: occasionally, reports 120s/40s as last readings - Denies any SOB, CP, vision changes, LE edema, medication SEs, or symptoms of hypotension  HLD - medications: Lipitor increase to 80 mg daily at last visit with pharmacy clinic in 11/2015 - compliance: Good - medication SEs: None, denies any myalgias   Review of Systems:  Per HPI.   Social History: Former smoker  Objective:  BP 138/77 mmHg  Pulse 66  Temp(Src) 98.2 F (36.8 C) (Oral)  Ht 5\' 10"  (1.778 m)  Wt 369 lb 9.6 oz (167.649 kg)  BMI 53.03 kg/m2  Gen:  55 y.o. male in NAD, obese HEENT: NCAT, MMM, EOMI, PERRL, anicteric sclerae CV: RRR, no MRG Resp: Non-labored, CTAB, no wheezes noted Ext: WWP, no edema MSK: No obvious deformity Neuro: Alert and oriented, speech normal      Chemistry      Component Value Date/Time   NA 139 06/16/2015 0921   K 4.1 06/16/2015 0921   CL 101 06/16/2015 0921   CO2 31 06/16/2015 0921   BUN 10 06/16/2015 0921   CREATININE 0.99 06/16/2015 0921   CREATININE 1.02 02/13/2012 0329      Component Value Date/Time   CALCIUM 8.6 06/16/2015 0921   ALKPHOS 92 01/27/2014 1329   AST 19 01/27/2014 1329   ALT 22 01/27/2014 1329   BILITOT 0.3 01/27/2014 1329      Lab Results  Component  Value Date   WBC 7.8 12/14/2014   HGB 12.0* 12/14/2014   HCT 37.6* 12/14/2014   MCV 85.5 12/14/2014   PLT 312 12/14/2014   Lab Results  Component Value Date   HGBA1C 5.7 02/04/2016   Assessment & Plan:     Craig [name] is a 55 y.o. male here for   Type 2 diabetes mellitus, controlled Well-controlled, A1c 5.7 Continue metformin 1000 mg twice a day Advised patient that we should stop as needed insulin He is agreeable to stopping at next visit if A1c is still well controlled Follow-up in 3 months  Morbid obesity with BMI of 60.0-69.9, adult Discussed diet and exercise  Hyperlipidemia Tolerating Lipitor 80 daily Check direct LDL today with help of 50% reduction or at or near goal of 70  Hypertension goal BP (blood pressure) < 140/90 Well-controlled on manual repeat Pulse in the 70s Continue current medications Follow-up in one month and consider altering medication regimen at that time     Virginia Crews, MD MPH PGY-2,  [facility] 02/04/2016  11:34 AM

## 2016-02-04 NOTE — Assessment & Plan Note (Signed)
Well-controlled, A1c 5.7 Continue metformin 1000 mg twice a day Advised patient that we should stop as needed insulin He is agreeable to stopping at next visit if A1c is still well controlled Follow-up in 3 months

## 2016-02-04 NOTE — Assessment & Plan Note (Signed)
Well-controlled on manual repeat Pulse in the 70s Continue current medications Follow-up in one month and consider altering medication regimen at that time

## 2016-02-05 ENCOUNTER — Encounter: Payer: Self-pay | Admitting: Family Medicine

## 2016-02-05 LAB — BASIC METABOLIC PANEL WITH GFR
BUN: 12 mg/dL (ref 7–25)
CHLORIDE: 100 mmol/L (ref 98–110)
CO2: 28 mmol/L (ref 20–31)
CREATININE: 1.02 mg/dL (ref 0.70–1.33)
Calcium: 8.9 mg/dL (ref 8.6–10.3)
GFR, Est African American: >89 mL/min (ref >=60)
GFR, Est Non African American: 84 mL/min (ref >=60)
Glucose, Bld: 58 mg/dL — ABNORMAL LOW (ref 65–99)
POTASSIUM: 4.3 mmol/L (ref 3.5–5.3)
SODIUM: 138 mmol/L (ref 135–146)

## 2016-02-05 LAB — LDL CHOLESTEROL, DIRECT: LDL DIRECT: 88 mg/dL (ref <130)

## 2016-03-03 ENCOUNTER — Ambulatory Visit (INDEPENDENT_AMBULATORY_CARE_PROVIDER_SITE_OTHER): Payer: Medicaid Other | Admitting: Family Medicine

## 2016-03-03 VITALS — BP 139/69 | HR 62 | Temp 98.4°F | Ht 70.0 in | Wt 375.0 lb

## 2016-03-03 DIAGNOSIS — I10 Essential (primary) hypertension: Secondary | ICD-10-CM

## 2016-03-03 MED ORDER — LOSARTAN POTASSIUM-HCTZ 100-12.5 MG PO TABS: 1.0000 | ORAL_TABLET | Freq: Every day | ORAL | Status: DC

## 2016-03-03 MED ORDER — CARVEDILOL 3.125 MG PO TABS: 3.1250 mg | ORAL_TABLET | Freq: Two times a day (BID) | ORAL | Status: DC

## 2016-03-03 NOTE — Progress Notes (Signed)
   Subjective:   Craig [name] is a 55 y.o. male with a history of T2 DM, HTN, HLD here for hypertension follow-up  HTN: - Medications: Amlodipine 5 mg daily, carvedilol 6.25 mg twice a day, losartan HCTZ 50-12.5 mg daily - Compliance: good - Checking BP at home: occasionally, reports [id number] systolic this AM - Denies any SOB, CP, vision changes (but did get new glasses), worsening LE edema (though this is a chronic problem), medication SEs, or symptoms of hypotension - Diet: up and down - eating a lot of fruit, avoiding fried foods - Exercise: walking or bowling daily - looking at joining gym, did get home [employer]  Review of Systems:  Per HPI.   Social History: former smoker  Objective:  BP 139/69 mmHg  Pulse 62  Temp(Src) 98.4 F (36.9 C) (Oral)  Ht 5\' 10"  (1.778 m)  Wt 375 lb (170.099 kg)  BMI 53.81 kg/m2  SpO2 99%  Gen:  55 y.o. male in NAD HEENT: NCAT, MMM, EOMI, PERRL, anicteric sclerae CV: RRR, no MRG Resp: Non-labored, CTAB, no wheezes noted Ext: WWP, 2+ edema With chronic venous stasis changes over bilateral lower extremities MSK: Gait intact, no obvious deformities Neuro: Alert and oriented, speech normal      Chemistry      Component Value Date/Time   NA 138 02/04/2016 1115   K 4.3 02/04/2016 1115   CL 100 02/04/2016 1115   CO2 28 02/04/2016 1115   BUN 12 02/04/2016 1115   CREATININE 1.02 02/04/2016 1115   CREATININE 1.02 02/13/2012 0329      Component Value Date/Time   CALCIUM 8.9 02/04/2016 1115   ALKPHOS 92 01/27/2014 1329   AST 19 01/27/2014 1329   ALT 22 01/27/2014 1329   BILITOT 0.3 01/27/2014 1329      Lab Results  Component Value Date   WBC 7.8 12/14/2014   HGB 12.0* 12/14/2014   HCT 37.6* 12/14/2014   MCV 85.5 12/14/2014   PLT 312 12/14/2014    Lab Results  Component Value Date   HGBA1C 5.7 02/04/2016   Assessment & Plan:     Craig [name] is a 55 y.o. male here for   Hypertension goal BP (blood pressure) < 140/90 Heart rate  continues to be in the low 60s so decrease Coreg to 3.125mg  twice a day as is been discussed with Dr. Valentina Lucks in the past Blood pressure below goal on manual recheck today Due to small doses of multiple medications and ongoing lower extreme edema, discontinue amlodipine and increase her losartan dose to 100 mg daily Continue HCTZ at current dosing Follow-up in one month    Virginia Crews, MD MPH PGY-3,  [facility] 03/03/2016  2:39 PM

## 2016-03-03 NOTE — Assessment & Plan Note (Signed)
Heart rate continues to be in the low 60s so decrease Coreg to 3.125mg  twice a day as is been discussed with Dr. Valentina Lucks in the past Blood pressure below goal on manual recheck today Due to small doses of multiple medications and ongoing lower extreme edema, discontinue amlodipine and increase her losartan dose to 100 mg daily Continue HCTZ at current dosing Follow-up in one month

## 2016-03-03 NOTE — Patient Instructions (Signed)
Nice to see you again today. We will decrease your carvedilol to 3.125 twice a day. Stop taking amlodipine. I'm increasing your low losartan dose to 100 mg daily. HCTZ part of this combination pill will stay the same dose. Follow-up with Dr. Valentina Lucks in one month. Last of you have any problems sooner than this.  Take care, Dr. Jacinto Reap

## 2016-03-28 ENCOUNTER — Other Ambulatory Visit: Payer: Self-pay | Admitting: Internal Medicine

## 2016-03-28 ENCOUNTER — Telehealth: Payer: Self-pay | Admitting: Family Medicine

## 2016-03-28 MED ORDER — INSULIN PEN NEEDLE 31G X 8 MM MISC: 12 refills | Status: DC

## 2016-03-28 MED ORDER — GLUCOSE BLOOD VI STRP
1.0000 | ORAL_STRIP | 11 refills | Status: DC | PRN
Start: 2016-03-28 — End: 2017-05-14

## 2016-03-28 MED ORDER — LANCETS ULTRA THIN 30G MISC: 12 refills | Status: DC

## 2016-03-28 MED ORDER — ACCU-CHEK AVIVA PLUS W/DEVICE KIT: 1.0000 | PACK | 0 refills | Status: DC | PRN

## 2016-03-28 NOTE — Telephone Encounter (Signed)
Please let [name] know that I have sent in a new glucometer as well as a refill of diabetic supplies into his pharmacy. Thank you!

## 2016-03-28 NOTE — Telephone Encounter (Signed)
pt is calling because he lost his glucose meter while traveling. He needs a new meter and the supplies with this called in to his pharmacy. He was using the Accu-Chek Aviva Plus. jw

## 2016-03-28 NOTE — Progress Notes (Signed)
I am covering Dr. Nancy Nordmann box while she is on maternity leave. I received a call that Pt was requesting a new glucometer because he has lost his. He also needs a refill of his glucose supplies. Refills sent to pharmacy.  Hyman Bible, MD PGY-2

## 2016-03-29 NOTE — Telephone Encounter (Signed)
Left message on voicemail informing of message from MD. 

## 2016-03-30 ENCOUNTER — Encounter: Payer: Self-pay | Admitting: Pharmacist

## 2016-03-30 ENCOUNTER — Ambulatory Visit (INDEPENDENT_AMBULATORY_CARE_PROVIDER_SITE_OTHER): Payer: Medicaid Other | Admitting: Pharmacist

## 2016-03-30 DIAGNOSIS — E785 Hyperlipidemia, unspecified: Secondary | ICD-10-CM | POA: Diagnosis not present

## 2016-03-30 DIAGNOSIS — E1165 Type 2 diabetes mellitus with hyperglycemia: Secondary | ICD-10-CM | POA: Diagnosis not present

## 2016-03-30 DIAGNOSIS — I10 Essential (primary) hypertension: Secondary | ICD-10-CM

## 2016-03-30 NOTE — Assessment & Plan Note (Signed)
Diabetes longstanding, controlled evidenced by A1C of 5.7 on 02/04/16 and home BG monitor range of 90-150. Patient denies hypoglycemic events and is able to verbalize appropriate hypoglycemia management plan. Patient reports adherence with medication.  Decrease PRN novolog to 10 units for BG >150. Continue metformin 1000 mg BID. Next A1C anticipated next month.  Weight loss goal - short-term set by patient is 335 pounds.

## 2016-03-30 NOTE — Progress Notes (Signed)
    S:    Patient arrives in good spirits, ambulating without assistance.  Presents for diabetes evaluation, education, and management at the request of Dr. Brita Romp. Patient was last seen by Primary Care Provider on 03/03/16.   Patient reports adherence with medications.  Current diabetes medications include: novolog 10-20 units for BG >150. (last used 20 units yesterday), metformin 1000 mg BID Current hypertension medications include: carvedilol 3.125 BID, losartan 50-12.5 mg daily. Of note- amlodipine was recently dc'd for LEE which has since improved. Per Dr. Nancy Nordmann note, losartan-HCTZ was to be increased to 100-12.5 mg however pt has not done so and BP within goal today.  Patient denies hypoglycemic events. Pt reports BGs range is 85-110 at any point in the day. Typically tests BID. Meter readings for the last 3 days : 89-150.   Patient reported dietary habits: Eats 6-8 small (size of his hand) meals/day Breakfast:glucerna shake Lunch: sometimes eats a salad, usually skips lunch Dinner:string beans, 1/2 baked potato, baked chicken Snacks: several snacks/day - cucumber+tuna fish, crackers, greens Drinks: water, Gatorade, zero-sugar juice ([employer] sells this)  Patient reported exercise habits: trying to get back into bowling, total gym in his home. Set his goal at bowling 3 times per week and will try to bowl 4 times per week for exercise.    O:  Lab Results  Component Value Date   HGBA1C 5.7 02/04/2016   Vitals:   03/30/16 1122  BP: 122/70  Pulse: 67    10 year ASCVD risk: 17.1. On a high-intensity statin.  A/P: Diabetes longstanding, controlled evidenced by A1C of 5.7 on 02/04/16 and home BG monitor range of 90-150. Patient denies hypoglycemic events and is able to verbalize appropriate hypoglycemia management plan. Patient reports adherence with medication.  Decrease PRN novolog to 10 units for BG >150. Continue metformin 1000 mg BID. Next A1C anticipated next month.   Weight loss goal - short-term set by patient is 335 pounds.   ASCVD risk greater than 7.5%. Continued Aspirin 81 mg and Continued atorvastatin 80 mg.   Hypertension longstanding currently controlled as evidenced by in-clinic BP.  Patient reports adherence with medication. Continue losartan 50-12.5 mg daily, carvedilol 3.125 BID.   Written patient instructions provided.  Total time in face to face counseling 30 minutes.   Follow up in Pharmacist Clinic Visit in 4 weeks.  Patient seen with Mechele Dawley, PharmD Candidate and Deirdre Pippins, PharmD Resident.

## 2016-03-30 NOTE — Assessment & Plan Note (Signed)
ASCVD risk greater than 7.5%. Continued Aspirin 81 mg and Continued atorvastatin 80 mg.

## 2016-03-30 NOTE — Assessment & Plan Note (Signed)
Hypertension longstanding currently controlled as evidenced by in-clinic BP.  Patient reports adherence with medication. Continue losartan 50-12.5 mg daily, carvedilol 3.125 BID.

## 2016-03-30 NOTE — Progress Notes (Signed)
Patient ID: Craig [name], male   DOB: [birth year], 55 y.o.   MRN: [id number] Reviewed: Agree with Dr. Graylin Shiver documentation and management.

## 2016-03-30 NOTE — Patient Instructions (Addendum)
Thanks for coming to see Craig [name] today!   Try to go bowling 3-4 times daily Work toward your goal of weighing 335 lbs by the end of 2017.  Do not use more than 10 units of insulin if your blood sugar is >150. We don't want you to drop your blood sugar too low.  Keep up the good work with your diet and try to avoid ice cream sandwiches Start taking pantoprazole every other day. You may not need as much as you start to lose weight.   Come back to see Craig [name] in 4 weeks.

## 2016-04-27 ENCOUNTER — Ambulatory Visit: Payer: Medicaid Other | Admitting: Pharmacist

## 2016-05-11 ENCOUNTER — Ambulatory Visit (INDEPENDENT_AMBULATORY_CARE_PROVIDER_SITE_OTHER): Payer: Medicaid Other | Admitting: Pharmacist

## 2016-05-11 ENCOUNTER — Encounter: Payer: Self-pay | Admitting: Pharmacist

## 2016-05-11 DIAGNOSIS — Z23 Encounter for immunization: Secondary | ICD-10-CM

## 2016-05-11 DIAGNOSIS — I10 Essential (primary) hypertension: Secondary | ICD-10-CM | POA: Diagnosis not present

## 2016-05-11 DIAGNOSIS — Z6841 Body Mass Index (BMI) 40.0 and over, adult: Secondary | ICD-10-CM

## 2016-05-11 DIAGNOSIS — E1165 Type 2 diabetes mellitus with hyperglycemia: Secondary | ICD-10-CM

## 2016-05-11 LAB — POCT GLYCOSYLATED HEMOGLOBIN (HGB A1C): HEMOGLOBIN A1C: 5.7

## 2016-05-11 NOTE — Assessment & Plan Note (Signed)
Diabetes longstanding diagnosed currently controlled with A1C of 5.7 today. Patient denies hypoglycemic events and is able to verbalize appropriate hypoglycemia management plan. Patient reports adherence with medication. Will continue metformin 1000 mg BID and Novolog 10-20 units as needed for BG >150 mg/dL.

## 2016-05-11 NOTE — Assessment & Plan Note (Signed)
Morbid Obesity - Longstanding with current BMI of 54. Patient has gained approximately 6 pounds since last visit 1 month prior.  Patient expresses interest in continued weight loss but activity is limited by chronic knee pain.  Patient inquired about medications to assist with weight loss attempt.  Discussed Contrave (bupropion/naltrexone) as an option but the medication is not covered by medicaid and may not be an affordable option.  Also considering the possibility of bupropion as a stand alone option as this drug is on Medicaid's formulary.  Patient is not a candidate for Saxenda due to severe nausea/vomiting and possible shortness of breath with former Victoza  trial.  Patient will discuss options at next visit with Dr. Dorene Grebe.

## 2016-05-11 NOTE — Patient Instructions (Signed)
Continue to take your medications as you have been - no changes today  Please followup with Dr Dorene Grebe in 1 month Continue to increase your exercise and make good food choices  Thank you for coming in today

## 2016-05-11 NOTE — Progress Notes (Signed)
    S:    Patient arrives in good spirits ambulating without assistance.  Presents for diabetes evaluation, education, and management at the request of Dr. Brita Romp.  Patient was last seen by Primary Care Provider on 03/03/16.  He reports his weight is increased today due to lack of exercise recently due to knee and shoulder pain.  He states he went to a massage therapist for his shoulder pain which helped and his shoulder pain has resolved but he continues to have chronic knee pain.   Patient reports adherence with medications.  Current diabetes medications include: metformin 1000 mg BID, Novolog 10-20 units as needed with meals for BG >150 mg/dL (has used 5 times over the past month) Current hypertension medications include: carvedilol 3.125 mg BID, losartan/hctz 50/12.5 mg daily   Patient denies hypoglycemic events. Reports knowledge of proper treatment.  Patient reported dietary habits: Eats 1 larger meal a day with 2 glucerna shakes at other times of the day.  Patient reported exercise habits: Walking every other day for 5-10 minutes, uses [employer] daily for 20 minutes per day.  Also goes to water aerobics around once a month at the [facility].   Patient is interested in further weight loss and is considering lap/bypass surgery or alternate drug therapy options.   O:  Lab Results  Component Value Date   HGBA1C 5.7 05/11/2016   Vitals:   05/11/16 1016 05/11/16 1021  BP: (!) 141/78 124/72  Pulse: 67 66    Home fasting CBG: 90-110s   A/P: Diabetes longstanding diagnosed currently controlled with A1C of 5.7 today. Patient denies hypoglycemic events and is able to verbalize appropriate hypoglycemia management plan. Patient reports adherence with medication. Will continue metformin 1000 mg BID and Novolog 10-20 units as needed for BG >150 mg/dL.   Next A1C anticipated 3 months.    ASCVD risk greater than 7.5%. Continued Aspirin 81 mg and Continued atorvastatin 80 mg.   Hypertension  longstanding/newly diagnosed currently at goal <140/90.  Patient reports adherence with medication. Continue carvedilol 3.125 mg BID and losartan/hctz 50/12.5 mg daily.  Morbid Obesity - Longstanding with current BMI of 54. Patient has gained approximately 6 pounds since last visit 1 month prior.  Patient expresses interest in continued weight loss but activity is limited by chronic knee pain.  Patient inquired about medications to assist with weight loss attempt.  Discussed Contrave (bupropion/naltrexone) as an option but the medication is not covered by medicaid and may not be an affordable option.  Also considering the possibility of bupropion as a stand alone option as this drug is on Medicaid's formulary.  Patient is not a candidate for Saxenda due to severe nausea/vomiting and possible shortness of breath with former Victoza  trial.  Patient will discuss options at next visit with Dr. Dorene Grebe.  Written patient instructions provided.  Total time in face to face counseling 45 minutes.   Follow up with Dr. Dorene Grebe in 1 month. Patient seen with Joellyn Haff, PharmD Candidate and Bennye Alm, PharmD Resident.

## 2016-05-11 NOTE — Assessment & Plan Note (Signed)
Hypertension longstanding/newly diagnosed currently at goal <140/90.  Patient reports adherence with medication. Continue carvedilol 3.125 mg BID and losartan/hctz 50/12.5 mg daily.

## 2016-05-12 NOTE — Progress Notes (Signed)
Patient ID: Craig [name], male   DOB: 22-Feb-1961, 55 y.o.   MRN: [id number] Reviewed: Agree with Dr. Graylin Shiver documentation and management.

## 2016-06-08 ENCOUNTER — Ambulatory Visit (INDEPENDENT_AMBULATORY_CARE_PROVIDER_SITE_OTHER): Payer: Medicaid Other | Admitting: Family Medicine

## 2016-06-08 VITALS — BP 108/57 | HR 64 | Temp 98.1°F | Ht 70.0 in | Wt 382.2 lb

## 2016-06-08 DIAGNOSIS — J069 Acute upper respiratory infection, unspecified: Secondary | ICD-10-CM | POA: Diagnosis not present

## 2016-06-08 DIAGNOSIS — Z6841 Body Mass Index (BMI) 40.0 and over, adult: Secondary | ICD-10-CM | POA: Diagnosis not present

## 2016-06-08 DIAGNOSIS — B9789 Other viral agents as the cause of diseases classified elsewhere: Secondary | ICD-10-CM

## 2016-06-08 MED ORDER — BUPROPION HCL ER (XL) 150 MG PO TB24: 150.0000 mg | ORAL_TABLET | Freq: Every day | ORAL | 1 refills | Status: DC

## 2016-06-08 NOTE — Assessment & Plan Note (Signed)
Longstanding with current BMI of 54.  Patient has gained approximately 2 pounds since last visit 1 month prior.   Patient expresses interest in continued weight loss but activity is limited by chronic knee pain.   Patient inquired about medications to assist with weight loss attempt.   Discussed Contrave (bupropion/naltrexone) as an option but the medication is not covered by medicaid and may not be an affordable option.   Start buproprion XL 150mg  daily F/u in 2 wks and consider increasing to 300mg  if well tolerated  Patient is not a candidate for Saxenda due to severe nausea/vomiting and possible shortness of breath with former Victoza  trial. Also given information about bariatric surgery

## 2016-06-08 NOTE — Progress Notes (Signed)
   Subjective:   TREBOR WALCUTT is a 55 y.o. male with a history of HTN, HLD, morbid obesity, T2 DM here for weight loss medication  Morbid obesity - discussed with Dr Valentina Lucks - Has gained a proximally 2 pounds since last visit 1 month prior - Has been trying to work on weight loss with diet and exercise activities limited by chronic knee pain - Inquired again about medications to assist with weight loss attempt  Patient reported dietary habits: Eats 1 larger meal a day with 2 glucerna shakes at other times of the day. Diet hasn't been going well with URI - started eating more salads last week  Patient reported exercise habits: Walking every other day for 5-10 minutes, uses [employer] daily for 20 minutes per day.  Also goes to water aerobics around once a month at the [facility].   Samuel [place] - hoarse voice, sore throat, cough, fevers - 2 wks ago - thinks it happened after flu shot  Review of Systems:  Per HPI.   Social History: former smoker  Objective:  BP (!) 108/57   Pulse 64   Temp 98.1 F (36.7 C) (Oral)   Ht 5\' 10"  (1.778 m)   Wt (!) 382 lb 3.2 oz (173.4 kg)   BMI 54.84 kg/m   Gen:  55 y.o. male in NAD, morbidly obese HEENT: NCAT, MMM, EOMI, PERRL, anicteric sclerae, OP clear, no lymphadenopathy CV: RRR, no MRG Resp: Non-labored, CTAB, no wheezes noted Ext: WWP, trace edema MSK: No obvious deformities, gait intact Neuro: Alert and oriented, speech normal      Chemistry      Component Value Date/Time   NA 138 02/04/2016 1115   K 4.3 02/04/2016 1115   CL 100 02/04/2016 1115   CO2 28 02/04/2016 1115   BUN 12 02/04/2016 1115   CREATININE 1.02 02/04/2016 1115      Component Value Date/Time   CALCIUM 8.9 02/04/2016 1115   ALKPHOS 92 01/27/2014 1329   AST 19 01/27/2014 1329   ALT 22 01/27/2014 1329   BILITOT 0.3 01/27/2014 1329      Lab Results  Component Value Date   WBC 7.8 12/14/2014   HGB 12.0 (L) 12/14/2014   HCT 37.6 (L) 12/14/2014   MCV 85.5 12/14/2014   PLT  312 12/14/2014   No results found for: TSH Lab Results  Component Value Date   HGBA1C 5.7 05/11/2016   Assessment & Plan:     SUYOG PENIX is a 55 y.o. male here for   Morbid obesity with BMI of 60.0-69.9, adult Longstanding with current BMI of 54.  Patient has gained approximately 2 pounds since last visit 1 month prior.   Patient expresses interest in continued weight loss but activity is limited by chronic knee pain.   Patient inquired about medications to assist with weight loss attempt.   Discussed Contrave (bupropion/naltrexone) as an option but the medication is not covered by medicaid and may not be an affordable option.   Start buproprion XL 150mg  daily F/u in 2 wks and consider increasing to 300mg  if well tolerated  Patient is not a candidate for Saxenda due to severe nausea/vomiting and possible shortness of breath with former Victoza  trial. Also given information about bariatric surgery    Symptomatic management for viral URI Return precautions discussed  Virginia Crews, MD MPH PGY-3,  [facility] 06/08/2016  12:28 PM

## 2016-06-08 NOTE — Patient Instructions (Signed)
Nice to see you again today.  Start Buproprion 150mg  daily for weight loss.  Continue to work on [employer] and exercise.  Look at website for bariatric surgery.  I will see you back in 2 weeks to see how the new medication is doing.  Take care, Dr. B   Bariatric Surgery Information Bariatric surgery, also called weight loss surgery, is a procedure that helps you lose weight. You may consider or your health care provider may suggest bariatric surgery if:  You are severely obese and have been unable to lose weight through diet and exercise.  You have health problems related to obesity, such as:  Type 2 diabetes.  Heart disease.  Lung disease. HOW DOES BARIATRIC SURGERY HELP ME LOSE WEIGHT?  Bariatric surgery helps you lose weight by decreasing how much food your body absorbs. This is done by closing off part of your stomach to make it smaller. This restricts the amount of food your stomach can hold. Bariatric surgery can also change your body's regular digestive process, so that food bypasses the parts of your body that absorb calories and nutrients.  If you decide to have bariatric surgery, it is important to continue to eat a healthy diet and exercise regularly after the surgery.  WHAT ARE THE DIFFERENT KINDS OF BARIATRIC SURGERY?  There are two kinds of bariatric surgeries:  Restrictive surgeries make your stomach smaller. They do not change your digestive process. The smaller the size of your new stomach, the less food you can eat. There are different types of restrictive surgeries.  Malabsorptive surgeries both make your stomach smaller and alter your digestive process so that your body processes less calories and nutrients. These are the most common kind of bariatric surgery. There are different types of malabsorptive surgeries. WHAT ARE THE DIFFERENT TYPES OF RESTRICTIVE SURGERY? Adjustable Gastric Banding In this procedure, an inflatable band is placed around your stomach  near the upper end. This makes the passageway for food into the rest of your stomach much smaller. The band can be adjusted, making it tighter or looser, by filling it with salt solution. Your surgeon can adjust the band based on how are you feeling and how much weight you are losing. The band can be removed in the future.  Vertical Banded Gastroplasty In this procedure, staples are used to separate your stomach into two parts, a small upper pouch and a bigger lower pouch. This decreases how much food you can eat. Sleeve Gastrectomy In this procedure, your stomach is made smaller. This is done by surgically removing a large part of your stomach. When your stomach is smaller, you feel full more quickly and reduce how much you eat. WHAT ARE THE DIFFERENT TYPES OF MALABSORPTIVE SURGERY? Roux-en-Y Gastric Bypass (RGB) This is the most common weight loss surgery. In this procedure, a small stomach pouch is created in the upper part of your stomach. Next, this small stomach pouch is attached directly to the middle part of your small intestine. The farther down your small intestine the new connection is made, the fewer calories and nutrients you will absorb.  Biliopancreatic Diversion with Duodenal Switch (BPD/DS)  This is a multi-step procedure. In this procedure, a large part of your stomach is removed, making your stomach smaller. Next, this smaller stomach is attached to the lower part of your small intestine. Like the RGB surgery, you absorb fewer calories and nutrients the farther down your small intestine the attachment is made.   WHAT ARE THE  RISKS OF BARIATRIC SURGERY? As with any surgical procedure, each type of bariatric surgery has its own risks. These risks also depend on your age, your overall health, and any other medical conditions you may have. When deciding on bariatric surgery, it is very important to:   Talk to your health care provider and choose the surgery that is best for you.  Ask  your health care provider about specific risks for the surgery you choose. FOR MORE INFORMATION  American Society for Metabolic & Bariatric Surgery: www.asmbs.org  Weight-control Information Network (WIN): win.AmenCredit.is   This information is not intended to replace advice given to you by your health care provider. Make sure you discuss any questions you have with your health care provider.   Document Released: 07/31/2005 Document Revised: 04/21/2015 Document Reviewed: 01/29/2013 Elsevier Interactive Patient Education [employer].

## 2016-06-20 ENCOUNTER — Other Ambulatory Visit: Payer: Self-pay | Admitting: *Deleted

## 2016-06-20 DIAGNOSIS — I10 Essential (primary) hypertension: Secondary | ICD-10-CM

## 2016-06-20 MED ORDER — CARVEDILOL 3.125 MG PO TABS: 3.1250 mg | ORAL_TABLET | Freq: Two times a day (BID) | ORAL | 11 refills | Status: DC

## 2016-06-23 ENCOUNTER — Telehealth: Payer: Self-pay | Admitting: *Deleted

## 2016-06-23 NOTE — Telephone Encounter (Signed)
Prior Authorization received from [facility] pharmacy for Losartan-HCTZ. PA was approved via [employer] [place] [employer], valid dates 06/22/16-06/22/17. Approval [id number].   Derl Barrow, RN

## 2016-07-05 ENCOUNTER — Encounter: Payer: Self-pay | Admitting: Family Medicine

## 2016-07-05 ENCOUNTER — Ambulatory Visit (INDEPENDENT_AMBULATORY_CARE_PROVIDER_SITE_OTHER): Payer: Medicaid Other | Admitting: Family Medicine

## 2016-07-05 VITALS — BP 136/83 | HR 74 | Temp 97.9°F | Ht 70.0 in | Wt 384.0 lb

## 2016-07-05 DIAGNOSIS — Z6841 Body Mass Index (BMI) 40.0 and over, adult: Secondary | ICD-10-CM | POA: Diagnosis not present

## 2016-07-05 MED ORDER — BUPROPION HCL ER (XL) 300 MG PO TB24: 300.0000 mg | ORAL_TABLET | Freq: Every day | ORAL | 1 refills | Status: DC

## 2016-07-05 NOTE — Assessment & Plan Note (Signed)
Gain 2 pounds from recent visit Increase bupropion to 300 mg daily Follow-up in one month Continue to emphasize diet and exercise importance

## 2016-07-05 NOTE — Progress Notes (Signed)
   Subjective:   Craig [name] is a 55 y.o. male with a history of HTN, HLD, morbid obesity, T2 DM here for weight loss medication follow-up  Morbid obesity - feels no different - started on buproprion XL 150mg  daily for ~1 months ago - up 2 lbs since last visit one month ago - dietary habits: eats 1 larger meal with 2 glucerna shakes at other times during the day - doesn't feel like nutrition was helpful - exercise habits: walking qod 5-10 min, uses total gym 5min/day, water aerobics once per month at [facility] - will change as it gets cold - went to some classes for bariatric surgery - doesn't think its a good option for him - Reports that he used to weigh about 600 pounds and is now down to 384  Review of Systems:  Per HPI.   Social History: Former smoker  Objective:  BP 136/83   Pulse 74   Temp 97.9 F (36.6 C) (Oral)   Ht 5\' 10"  (1.778 m)   Wt (!) 384 lb (174.2 kg)   BMI 55.10 kg/m   Gen:  55 y.o. male in NAD  HEENT: NCAT, MMM, EOMI, PERRL, anicteric sclerae CV: RRR, no MRG Resp: Non-labored, CTAB, no wheezes noted Abd: Soft, NTND, BS present, no guarding or organomegaly Ext: WWP, no edema MSK: No obvious deformities, gait intact Neuro: Alert and oriented, speech normal       Chemistry      Component Value Date/Time   NA 138 02/04/2016 1115   K 4.3 02/04/2016 1115   CL 100 02/04/2016 1115   CO2 28 02/04/2016 1115   BUN 12 02/04/2016 1115   CREATININE 1.02 02/04/2016 1115      Component Value Date/Time   CALCIUM 8.9 02/04/2016 1115   ALKPHOS 92 01/27/2014 1329   AST 19 01/27/2014 1329   ALT 22 01/27/2014 1329   BILITOT 0.3 01/27/2014 1329      Lab Results  Component Value Date   WBC 7.8 12/14/2014   HGB 12.0 (L) 12/14/2014   HCT 37.6 (L) 12/14/2014   MCV 85.5 12/14/2014   PLT 312 12/14/2014   Lab Results  Component Value Date   HGBA1C 5.7 05/11/2016   Assessment & Plan:     KYANDRE CHLUDZINSKI is a 55 y.o. male here for   Morbid obesity with BMI of  60.0-69.9, adult Gain 2 pounds from recent visit Increase bupropion to 300 mg daily Follow-up in one month Continue to emphasize diet and exercise importance     Virginia Crews, MD MPH PGY-3,  [facility] 07/05/2016  12:02 PM

## 2016-07-05 NOTE — Patient Instructions (Signed)
Nice to see you again.  Increase bupropion to 300mg  daily.  Come back in 4-6 weeks when you are able to between the holidays  Take care, Dr. Jacinto Reap

## 2016-08-03 ENCOUNTER — Ambulatory Visit (INDEPENDENT_AMBULATORY_CARE_PROVIDER_SITE_OTHER): Payer: Medicaid Other | Admitting: Family Medicine

## 2016-08-03 ENCOUNTER — Encounter: Payer: Self-pay | Admitting: Family Medicine

## 2016-08-03 VITALS — BP 108/62 | HR 76 | Temp 98.0°F | Ht 70.0 in | Wt 385.4 lb

## 2016-08-03 DIAGNOSIS — Z6841 Body Mass Index (BMI) 40.0 and over, adult: Secondary | ICD-10-CM

## 2016-08-03 DIAGNOSIS — G5603 Carpal tunnel syndrome, bilateral upper limbs: Secondary | ICD-10-CM

## 2016-08-03 DIAGNOSIS — E1165 Type 2 diabetes mellitus with hyperglycemia: Secondary | ICD-10-CM | POA: Diagnosis not present

## 2016-08-03 DIAGNOSIS — I10 Essential (primary) hypertension: Secondary | ICD-10-CM

## 2016-08-03 DIAGNOSIS — M79641 Pain in right hand: Secondary | ICD-10-CM

## 2016-08-03 HISTORY — DX: Carpal tunnel syndrome, bilateral upper limbs: G56.03

## 2016-08-03 LAB — POCT GLYCOSYLATED HEMOGLOBIN (HGB A1C): Hemoglobin A1C: 5.6

## 2016-08-03 NOTE — Assessment & Plan Note (Signed)
Up 5 pounds in the last 3 months Discontinue bupropion as patient did not like the side effect of dry mouth Continue to emphasize importance of diet and exercise Patient is starting to consider bariatric surgery more seriously Follow-up in one month

## 2016-08-03 NOTE — Assessment & Plan Note (Signed)
No abnormalities on exam Symptoms the patient describes as well as repetitive motion involved in his work concerning for mild carpal tunnel syndrome it is intermittent and not present currently Referral to occupational therapy for nocturnal splint

## 2016-08-03 NOTE — Progress Notes (Signed)
Subjective:   Craig [name] is a 55 y.o. male with a history of HTN, HLD, morbid obesity, T2 DM here for chronic disease management and right hand pain   HTN: - Medications: coreg 3.125mg  BID, losartan-HCTZ 50-12.5mg  daily - Compliance: good - Checking BP at home: no - Denies any SOB, CP, vision changes, LE edema, medication SEs, or symptoms of hypotension  T2DM - Checking BG at home: fastings 80s-100s, one high in 140s - Medications: metformin 1000mg  BID, novolog SSI when high - Compliance: good - eye exam: needs - will make appt - foot exam: 02/04/16 - microalbumin: n/a ARB - denies symptoms of hypoglycemia, polyuria, polydipsia, numbness extremities, foot ulcers/trauma  Morbid obesity - feels no different - started on buproprion XL 150mg  daily and increased to 300mg  - caused dry mouth - stopped Wellbutrin last week - dry mouth starting to improve - weight was increasing over last 4 visits (up 5lbs) - thinking of trying lipozene (looks like a fiber supplement that makes you feel full) - dietary habits: eats 1 larger meal with 2 glucerna shakes at other times during the day - doesn't feel like nutrition was helpful - exercise habits: walking qod 5-10 min, uses total gym 28min/day, water aerobics once per month at [facility] - will change as it gets cold - Reports that he used to weigh about 600 pounds and is now down to 384 - thinking about bariatric surgery - willing to discuss after New Years  R hand pain - x2 wks - thinks he miht have strained it - no inciting trauma or injury - tried ibuprofen BID prn - hasnt noticed a difference on it - also tried to do hand squeezes to keep it moving and lightweight brace - pain can be on palmar or dorsal sides, medial or lateral - some stinging at night in hand - no numbness or weakness - 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   Social History: Former  smoker  Objective:  BP 108/62   Pulse 76   Temp 98 F (36.7 C) (Oral)   Ht 5\' 10"  (1.778 m)   Wt (!) 385 lb 6.4 oz (174.8 kg)   BMI 55.30 kg/m   Gen:  55 y.o. male in NAD HEENT: NCAT, MMM, EOMI, PERRL, anicteric sclerae CV: RRR, no MRG Resp: Non-labored, CTAB, no wheezes noted Abd: Obese Ext: WWP, trace edema MSK: Right wrist: Full ROM, strength intact, no tenderness to palpation, negative Phalen's and Tinel's testing, thumb opposition intact, sensation intact, no deformities Neuro: Alert and oriented, speech normal       Chemistry      Component Value Date/Time   NA 138 02/04/2016 1115   K 4.3 02/04/2016 1115   CL 100 02/04/2016 1115   CO2 28 02/04/2016 1115   BUN 12 02/04/2016 1115   CREATININE 1.02 02/04/2016 1115      Component Value Date/Time   CALCIUM 8.9 02/04/2016 1115   ALKPHOS 92 01/27/2014 1329   AST 19 01/27/2014 1329   ALT 22 01/27/2014 1329   BILITOT 0.3 01/27/2014 1329      Lab Results  Component Value Date   WBC 7.8 12/14/2014   HGB 12.0 (L) 12/14/2014   HCT 37.6 (L) 12/14/2014   MCV 85.5 12/14/2014   PLT 312 12/14/2014   No results found for: TSH Lab Results  Component Value Date   HGBA1C 5.6 08/03/2016  Assessment & Plan:     Craig [name] is a 55 y.o. male here for   Hypertension goal BP (blood pressure) < 140/90 Well-controlled on manual recheck Continue Coreg, losartan, HCTZ at current doses Follow-up in 3 months  Type 2 diabetes mellitus, controlled ([place]) Well controlled with A1c of 5.6 Continue metformin at current dose Discontinue NovoLog as patient is only using it sporadically and it is not necessary given his A1c level He denies any hypoglycemia Follow-up in one month and recheck A1c in 3 months  Morbid obesity with BMI of 60.0-69.9, adult Up 5 pounds in the last 3 months Discontinue bupropion as patient did not like the side effect of dry mouth Continue to emphasize importance of diet and exercise Patient is  starting to consider bariatric surgery more seriously Follow-up in one month  Right hand pain No abnormalities on exam Symptoms the patient describes as well as repetitive motion involved in his work concerning for mild carpal tunnel syndrome it is intermittent and not present currently Referral to occupational therapy for nocturnal splint     Virginia Crews, MD MPH PGY-3,  [facility] 08/03/2016  11:52 AM

## 2016-08-03 NOTE — Assessment & Plan Note (Signed)
Well-controlled on manual recheck Continue Coreg, losartan, HCTZ at current doses Follow-up in 3 months

## 2016-08-03 NOTE — Patient Instructions (Signed)
Nice to see you again today. Your A1c is 5.6. We can stop the insulin altogether. Do not be concerned if your blood sugars occasionally in the 140s. Continue to keep your log and continue taking her metformin. You can follow-up in one month on this.  Your hand pain may be related to mild carpal tunnel syndrome. I put in a referral for occupational therapy at the hand Center to make you some splints to wear at bedtime. Using continue take ibuprofen sparingly only when needed for pain  Follow-up on your weight in one month.  Take care, Dr. Jacinto Reap

## 2016-08-03 NOTE — Assessment & Plan Note (Signed)
Well controlled with A1c of 5.6 Continue metformin at current dose Discontinue NovoLog as patient is only using it sporadically and it is not necessary given his A1c level He denies any hypoglycemia Follow-up in one month and recheck A1c in 3 months

## 2016-09-04 ENCOUNTER — Ambulatory Visit: Payer: Medicaid Other | Admitting: Pharmacist

## 2016-09-11 DIAGNOSIS — G5603 Carpal tunnel syndrome, bilateral upper limbs: Secondary | ICD-10-CM | POA: Diagnosis not present

## 2016-09-11 DIAGNOSIS — M8700 Idiopathic aseptic necrosis of unspecified bone: Secondary | ICD-10-CM | POA: Diagnosis not present

## 2016-09-25 ENCOUNTER — Other Ambulatory Visit: Payer: Self-pay | Admitting: Family Medicine

## 2016-09-25 DIAGNOSIS — I10 Essential (primary) hypertension: Secondary | ICD-10-CM

## 2016-09-25 NOTE — Progress Notes (Signed)
Subjective:   Craig [name] is a 56 y.o. male with a history of HTN, HLD, morbid obesity, T2 DM here for chronic disease management and right hand pain  HTN: - Medications: coreg 3.125mg  BID, losartan-HCTZ 50-12.5mg  daily - Has been taking amlodipine 2.5 mg daily, though this was discontinued in 02/2016 due to lower extremity edema - Compliance: good - Checking BP at home: no - Denies any SOB, CP, vision changes, LE edema, medication SEs, or symptoms of hypotension  Morbid obesity - tried buproprion XL - no help and caused dry mouth - weight has been increasing over last 5 visits (up 9lbs in last 2 months) - dietary habits: eats 1 larger meal with 2 glucerna shakes at other times during the day, switched to doing mostly smoothies and purees - doesn't feel like nutrition was helpful - exercise habits: walking qod 5-10 min, uses total gym 72min/day (missed months due to worry about hand pain, started back yesterday), water aerobics once per month at [facility] - will change as it gets cold - Reports that he used to weigh about 600 pounds and is now down to 384 - thinking about bariatric surgery - willing to discuss further after his hand pain is dealt with  B/l carpal tunnel syndrome - Present for several months - Continues to have stinging pain in his hands especially at night - Taking ibuprofen when necessary - no numbness or weakness - 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   Social History: Former smoker  Objective:  BP 128/82   Pulse (!) 53   Wt (!) 394 lb (178.7 kg)   BMI 56.53 kg/m   Gen:  56 y.o. male in NAD HEENT: NCAT, MMM, EOMI, PERRL, anicteric sclerae CV: RRR, no MRG Resp: Non-labored, CTAB, no wheezes noted Abd: Obese Ext:  WWP, trace edema MSK: Right wrist: Full ROM, sensation intact, no deformities Neuro: Alert and oriented, speech normal       Chemistry      Component Value Date/Time   NA 138 02/04/2016 1115   K 4.3 02/04/2016 1115   CL 100 02/04/2016 1115   CO2 28 02/04/2016 1115   BUN 12 02/04/2016 1115   CREATININE 1.02 02/04/2016 1115      Component Value Date/Time   CALCIUM 8.9 02/04/2016 1115   ALKPHOS 92 01/27/2014 1329   AST 19 01/27/2014 1329   ALT 22 01/27/2014 1329   BILITOT 0.3 01/27/2014 1329      Lab Results  Component Value Date   WBC 7.8 12/14/2014   HGB 12.0 (L) 12/14/2014   HCT 37.6 (L) 12/14/2014   MCV 85.5 12/14/2014   PLT 312 12/14/2014   Lab Results  Component Value Date   HGBA1C 5.6 08/03/2016   Assessment & Plan:     Craig [name] is a 56 y.o. male here for   Hypertension goal BP (blood pressure) < 140/90 Well-controlled currently Continue Coreg, losartan, HCTZ at current doses Discontinue amlodipine as this was previously discontinued due to contribution to his lower extremity edema Follow-up in one month to ensure he is still well-controlled off amlodipine  Bilateral carpal tunnel syndrome Advised patient to follow-up with orthopedics as previously scheduled He reports he will call and reschedule his appointment Advised him that orthopedics would need to fill  out prior authorization for his testing that they ordered if this is required by [facility] Start gabapentin 300 mg daily at bedtime and increase to twice a day dosing after 2 weeks if not too sedating to help with pain  Morbid obesity with BMI of 60.0-69.9, adult Continues to gain weight Up 9 pounds in last 2 months Continued to emphasize importance of diet and exercise We will readdress option of bariatric surgery after carpal tunnel syndrome is under better control   Virginia Crews, MD MPH PGY-3,  [facility] 09/27/2016  2:43 PM

## 2016-09-27 ENCOUNTER — Encounter: Payer: Self-pay | Admitting: Family Medicine

## 2016-09-27 ENCOUNTER — Ambulatory Visit (INDEPENDENT_AMBULATORY_CARE_PROVIDER_SITE_OTHER): Payer: Medicaid Other | Admitting: Family Medicine

## 2016-09-27 VITALS — BP 128/82 | HR 53 | Wt 394.0 lb

## 2016-09-27 DIAGNOSIS — Z6841 Body Mass Index (BMI) 40.0 and over, adult: Secondary | ICD-10-CM

## 2016-09-27 DIAGNOSIS — I10 Essential (primary) hypertension: Secondary | ICD-10-CM

## 2016-09-27 DIAGNOSIS — G5603 Carpal tunnel syndrome, bilateral upper limbs: Secondary | ICD-10-CM | POA: Diagnosis not present

## 2016-09-27 MED ORDER — GABAPENTIN 300 MG PO CAPS: 300.0000 mg | ORAL_CAPSULE | Freq: Every day | ORAL | 1 refills | Status: DC

## 2016-09-27 NOTE — Assessment & Plan Note (Signed)
Continues to gain weight Up 9 pounds in last 2 months Continued to emphasize importance of diet and exercise We will readdress option of bariatric surgery after carpal tunnel syndrome is under better control

## 2016-09-27 NOTE — Assessment & Plan Note (Signed)
Well-controlled currently Continue Coreg, losartan, HCTZ at current doses Discontinue amlodipine as this was previously discontinued due to contribution to his lower extremity edema Follow-up in one month to ensure he is still well-controlled off amlodipine

## 2016-09-27 NOTE — Assessment & Plan Note (Signed)
Advised patient to follow-up with orthopedics as previously scheduled He reports he will call and reschedule his appointment Advised him that orthopedics would need to fill out prior authorization for his testing that they ordered if this is required by [facility] Start gabapentin 300 mg daily at bedtime and increase to twice a day dosing after 2 weeks if not too sedating to help with pain

## 2016-09-27 NOTE — Patient Instructions (Addendum)
Nice to see you again today.  Stop amlodipine.  Continue other blood pressure medications.    Start gabapentin 300mg  daily at bedtime.  Can increase to twice daily after 2 weeks if not too sleepy.  Follow-up in 1 month.  Take care, Dr. Jacinto Reap

## 2016-10-02 ENCOUNTER — Other Ambulatory Visit: Payer: Self-pay | Admitting: Orthopedic Surgery

## 2016-10-02 DIAGNOSIS — M8700 Idiopathic aseptic necrosis of unspecified bone: Secondary | ICD-10-CM

## 2016-10-13 ENCOUNTER — Other Ambulatory Visit: Payer: Self-pay | Admitting: Family Medicine

## 2016-10-13 DIAGNOSIS — I10 Essential (primary) hypertension: Secondary | ICD-10-CM

## 2016-10-20 ENCOUNTER — Other Ambulatory Visit: Payer: Self-pay | Admitting: Family Medicine

## 2016-10-23 ENCOUNTER — Inpatient Hospital Stay
Admission: RE | Admit: 2016-10-23 | Discharge: 2016-10-23 | Disposition: A | Discharge status: Home / Self Care | Payer: Medicaid Other | Source: Ambulatory Visit | Attending: Orthopedic Surgery | Admitting: Orthopedic Surgery

## 2016-10-31 ENCOUNTER — Ambulatory Visit (INDEPENDENT_AMBULATORY_CARE_PROVIDER_SITE_OTHER): Payer: Medicaid Other | Admitting: Family Medicine

## 2016-10-31 ENCOUNTER — Encounter: Payer: Self-pay | Admitting: Family Medicine

## 2016-10-31 VITALS — BP 118/74 | HR 88 | Temp 98.2°F | Ht 70.0 in | Wt 385.0 lb

## 2016-10-31 DIAGNOSIS — G5603 Carpal tunnel syndrome, bilateral upper limbs: Secondary | ICD-10-CM

## 2016-10-31 DIAGNOSIS — E1165 Type 2 diabetes mellitus with hyperglycemia: Secondary | ICD-10-CM

## 2016-10-31 DIAGNOSIS — Z6841 Body Mass Index (BMI) 40.0 and over, adult: Secondary | ICD-10-CM

## 2016-10-31 DIAGNOSIS — E785 Hyperlipidemia, unspecified: Secondary | ICD-10-CM

## 2016-10-31 DIAGNOSIS — I10 Essential (primary) hypertension: Secondary | ICD-10-CM | POA: Diagnosis not present

## 2016-10-31 LAB — POCT GLYCOSYLATED HEMOGLOBIN (HGB A1C): Hemoglobin A1C: 5.8

## 2016-10-31 MED ORDER — GABAPENTIN 100 MG PO CAPS: 100.0000 mg | ORAL_CAPSULE | Freq: Three times a day (TID) | ORAL | 2 refills | Status: DC

## 2016-10-31 NOTE — Assessment & Plan Note (Signed)
Well-controlled Continue Coreg, losartan, HCTZ at current doses Follow-up in 3 months Was planning to check BMP today, but patient unable to spend the time to do this today, so we will check these at his follow-up visit in 2 weeks

## 2016-10-31 NOTE — Assessment & Plan Note (Signed)
Patient will continue to follow up with orthopedics and complete their workup as scheduled Discussed options regarding gabapentin dosing with the patient and he prefers to change her 300 mg daily at bedtime to 100 mg 3 times a day to improve sedating effects Follow-up in 2 weeks and consider increasing dose at that time

## 2016-10-31 NOTE — Patient Instructions (Addendum)
Nice to see you again.  Your blood pressure is well controlled. Continue your current medications. Your blood sugar is also well controlled with an A1c of 5.8. Continue metformin.  For your carpal tunnel syndrome, we will change the gabapentin to 100 mg 3 times a day. Follow-up in 2 weeks and we will consider adjusting the dose further as needed.  We'll plan to get labs for your cholesterol and your electrolytes at your next visit. Congratulations on your weight loss. Keep it up with your diet and exercise.  Take care, Dr. Jacinto Reap

## 2016-10-31 NOTE — Assessment & Plan Note (Signed)
Doing well on atorvastatin 80 mg daily Was planning to check lipid panel today, but patient unable to spend the time to do this today, so we will check these at his follow-up visit in 2 weeks

## 2016-10-31 NOTE — Assessment & Plan Note (Signed)
Well-controlled A1c of 5.8 Continue metformin at current dose No hypoglycemia Follow-up in 3 - 6 months

## 2016-10-31 NOTE — Progress Notes (Signed)
Subjective:   Craig [name] is a 56 y.o. male with a history of HTN, T2 DM, HLD, morbid obesity, carpal tunnel syndrome, GERD here for chronic disease management follow-up  HTN: - Medications: Coreg 3.125mg  BID, losartan HCTZ 50-12.5 mg daily - Compliance: Good - Checking BP at home: No - Denies any SOB, CP, vision changes, LE edema, medication SEs, or symptoms of hypotension  T2DM - Checking BG at home: yes - fastings 80-130 - Medications: metformin 1000mg  BID - Compliance: Good - eye exam: needs, planning to schedule for next month - foot exam: 01/2016 - microalbumin: NA, ARB - denies symptoms of hypoglycemia, polyuria, polydipsia, numbness extremities, foot ulcers/trauma  HLD - medications: lipitor 80mg  daily - also taking daily baby aspirin - compliance: good - medication SEs: none  b/l carpal tunnel syndrome - undergoing workup with ortho - taking gabapentin 300mg  qhs for pain control - Thinks this is helping somewhat, but does wear off during the day - Makes him very sleepy when he takes it at bedtime, but does not remain groggy in the morning  Morbid obesity - tried buproprion XL - no help and caused dry mouth - weight had been increasing over last 5 visits - but now has lost 9 pounds in the last month - dietary habits: eats 1 larger meal with 2 glucerna shakes at other times during the day, switched to doing mostly smoothies and purees - doesn't feel like nutrition was helpful - exercise habits: walking qod 5-10 min, uses total gym 38min/day (missed months due to worry about hand pain, started back yesterday), now going to the [facility] about 5 times weekly - Reports that he used to weigh about 600 pounds and is now down to 384 - thinking about bariatric surgery - willing to discuss further after his hand pain is dealt with  Review of Systems:  Per HPI.   Social History: Former smoker  Objective:  BP 118/74   Pulse 88   Temp 98.2 F (36.8 C) (Oral)   Ht 5\' 10"   (1.778 m)   Wt (!) 385 lb (174.6 kg)   BMI 55.24 kg/m   Gen:  56 y.o. male in NAD, Morbidly obese HEENT: NCAT, MMM, EOMI, PERRL, anicteric sclerae CV: RRR, no MRG Resp: Non-labored, CTAB, no wheezes noted Abd: Soft, NTND, BS present, no guarding or organomegaly Ext: WWP, no edema MSK: Wearing bilateral wrist braces, gait intact Neuro: Alert and oriented, speech normal       Chemistry      Component Value Date/Time   NA 138 02/04/2016 1115   K 4.3 02/04/2016 1115   CL 100 02/04/2016 1115   CO2 28 02/04/2016 1115   BUN 12 02/04/2016 1115   CREATININE 1.02 02/04/2016 1115      Component Value Date/Time   CALCIUM 8.9 02/04/2016 1115   ALKPHOS 92 01/27/2014 1329   AST 19 01/27/2014 1329   ALT 22 01/27/2014 1329   BILITOT 0.3 01/27/2014 1329      Lab Results  Component Value Date   WBC 7.8 12/14/2014   HGB 12.0 (L) 12/14/2014   HCT 37.6 (L) 12/14/2014   MCV 85.5 12/14/2014   PLT 312 12/14/2014   No results found for: TSH Lab Results  Component Value Date   HGBA1C 5.8 10/31/2016   Assessment & Plan:     Craig [name] is a 56 y.o. male here for   Hypertension goal BP (blood pressure) < 140/90 Well-controlled Continue Coreg, losartan, HCTZ at current  doses Follow-up in 3 months Was planning to check BMP today, but patient unable to spend the time to do this today, so we will check these at his follow-up visit in 2 weeks  Type 2 diabetes mellitus, controlled ([place]) Well-controlled A1c of 5.8 Continue metformin at current dose No hypoglycemia Follow-up in 3 - 6 months  Bilateral carpal tunnel syndrome Patient will continue to follow up with orthopedics and complete their workup as scheduled Discussed options regarding gabapentin dosing with the patient and he prefers to change her 300 mg daily at bedtime to 100 mg 3 times a day to improve sedating effects Follow-up in 2 weeks and consider increasing dose at that time  Morbid obesity with BMI of 60.0-69.9,  adult Discussed diet and exercise Congratulated patient on his recent weight loss and encouraged him to keep up the work We will readdress option of bariatric surgery after carpal tunnel syndrome is under better control as per patient wishes  Hyperlipidemia Doing well on atorvastatin 80 mg daily Was planning to check lipid panel today, but patient unable to spend the time to do this today, so we will check these at his follow-up visit in 2 weeks    Virginia Crews, MD MPH PGY-3,  [facility] 10/31/2016  12:19 PM

## 2016-10-31 NOTE — Assessment & Plan Note (Signed)
Discussed diet and exercise Congratulated patient on his recent weight loss and encouraged him to keep up the work We will readdress option of bariatric surgery after carpal tunnel syndrome is under better control as per patient wishes

## 2016-11-08 ENCOUNTER — Other Ambulatory Visit: Payer: Self-pay | Admitting: Family Medicine

## 2016-11-08 DIAGNOSIS — E119 Type 2 diabetes mellitus without complications: Secondary | ICD-10-CM

## 2016-11-28 ENCOUNTER — Ambulatory Visit (INDEPENDENT_AMBULATORY_CARE_PROVIDER_SITE_OTHER): Payer: Medicaid Other | Admitting: Family Medicine

## 2016-11-28 VITALS — BP 122/78 | HR 88 | Temp 97.8°F | Ht 70.0 in | Wt 387.0 lb

## 2016-11-28 DIAGNOSIS — I10 Essential (primary) hypertension: Secondary | ICD-10-CM | POA: Diagnosis not present

## 2016-11-28 DIAGNOSIS — Z85028 Personal history of other malignant neoplasm of stomach: Secondary | ICD-10-CM

## 2016-11-28 DIAGNOSIS — G5603 Carpal tunnel syndrome, bilateral upper limbs: Secondary | ICD-10-CM

## 2016-11-28 MED ORDER — GABAPENTIN 300 MG PO CAPS: 300.0000 mg | ORAL_CAPSULE | Freq: Three times a day (TID) | ORAL | 2 refills | Status: DC

## 2016-11-28 NOTE — Progress Notes (Signed)
   Subjective:   Craig [name] is a 56 y.o. male with a history of Obesity, HTN, T2 DM, HLD, bilateral carpal tunnel syndrome here for follow-up of carpal tunnel syndrome  Bilateral carpal tunnel syndrome - Last seen 1 month ago - gabapentin was changed from 300mg  qhs to 100mg  TID to help improving sedating side effects - now taking 300mg  BID - He is being managed by Orthopedics and undergoing nerve conduction testing and MRI to assess for possible AVN of scaphoids bilaterally - wants a second Ortho opinion - doesn't think that he needs to lay flat for MRI for both wrists  h/o gastric cancer - early 2000s - laser surgery performed - thinks that it went away with diabetes coming on - previously followed by Dr Wynetta Emery in [place] - no records available from GI/Oncology  Review of Systems:  Per HPI.   Social History: former smoker  Objective:  BP 122/78   Pulse 88   Temp 97.8 F (36.6 C) (Oral)   Ht 5\' 10"  (1.778 m)   Wt (!) 387 lb (175.5 kg)   BMI 55.53 kg/m   Gen:  56 y.o. male in NAD, morbidly obese HEENT: NCAT, MMM, anicteric sclerae CV: RRR, no MRG Resp: Non-labored, CTAB, no wheezes noted Abd: Obese, NTND, BS present, no guarding or organomegaly Ext: WWP, 1+ edema MSK: Bilateral wrists without any tenderness. Tinel's and Phalen's negative bilaterally sensation intact to light touch, grip strength intact Neuro: Alert and oriented, speech normal       Chemistry      Component Value Date/Time   NA 138 02/04/2016 1115   K 4.3 02/04/2016 1115   CL 100 02/04/2016 1115   CO2 28 02/04/2016 1115   BUN 12 02/04/2016 1115   CREATININE 1.02 02/04/2016 1115      Component Value Date/Time   CALCIUM 8.9 02/04/2016 1115   ALKPHOS 92 01/27/2014 1329   AST 19 01/27/2014 1329   ALT 22 01/27/2014 1329   BILITOT 0.3 01/27/2014 1329      Lab Results  Component Value Date   WBC 7.8 12/14/2014   HGB 12.0 (L) 12/14/2014   HCT 37.6 (L) 12/14/2014   MCV 85.5 12/14/2014   PLT  312 12/14/2014   No results found for: TSH Lab Results  Component Value Date   HGBA1C 5.8 10/31/2016   Assessment & Plan:     Craig [name] is a 56 y.o. male here for   Bilateral carpal tunnel syndrome Increase gabapentin to 300 mg 3 times a day Occupational therapy referral for increasing hand strength Per patient request, new orthopedic referral placed for second opinion Follow-up as needed  History of gastric cancer Referral to GI for follow-up/surveillance  Hypertension goal BP (blood pressure) < 140/90 Recheck BMP as patient was unable to at his last visit   Virginia Crews, MD MPH PGY-3,  [facility] 11/28/2016  4:23 PM

## 2016-11-28 NOTE — Assessment & Plan Note (Signed)
Increase gabapentin to 300 mg 3 times a day Occupational therapy referral for increasing hand strength Per patient request, new orthopedic referral placed for second opinion Follow-up as needed

## 2016-11-28 NOTE — Assessment & Plan Note (Signed)
Referral to GI for follow-up/surveillance

## 2016-11-28 NOTE — Patient Instructions (Signed)
Nice to see you again today. You can increase your gabapentin to 300 mg 3 times a day. I will put in for second opinion from orthopedics. I also put in for occupational therapy for your hands with the hand Center in [place]. Someone should call you about these appointments within the next 2 weeks.  We are getting some labs today for your blood pressure and someone will call you received your letter with the results when they're available.  Follow-up in 2 months  Take care, Dr. Jacinto Reap

## 2016-11-28 NOTE — Assessment & Plan Note (Signed)
Recheck BMP as patient was unable to at his last visit

## 2016-11-29 ENCOUNTER — Encounter: Payer: Self-pay | Admitting: Family Medicine

## 2016-11-29 LAB — BASIC METABOLIC PANEL
BUN / CREAT RATIO: 11 (ref 9–20)
BUN: 9 mg/dL (ref 6–24)
CO2: 28 mmol/L (ref 18–29)
CREATININE: 0.83 mg/dL (ref 0.76–1.27)
Calcium: 8.9 mg/dL (ref 8.7–10.2)
Chloride: 102 mmol/L (ref 96–106)
GFR calc Af Amer: 116 mL/min/{1.73_m2} (ref >59)
GFR calc non Af Amer: 100 mL/min/{1.73_m2} (ref >59)
Glucose: 73 mg/dL (ref 65–99)
Potassium: 4.4 mmol/L (ref 3.5–5.2)
SODIUM: 143 mmol/L (ref 134–144)

## 2016-12-12 ENCOUNTER — Other Ambulatory Visit: Payer: Self-pay | Admitting: Family Medicine

## 2016-12-12 DIAGNOSIS — E785 Hyperlipidemia, unspecified: Secondary | ICD-10-CM

## 2016-12-26 LAB — HM DIABETES EYE EXAM

## 2016-12-28 ENCOUNTER — Ambulatory Visit (INDEPENDENT_AMBULATORY_CARE_PROVIDER_SITE_OTHER): Payer: Medicaid Other | Admitting: Orthopaedic Surgery

## 2016-12-28 DIAGNOSIS — M25532 Pain in left wrist: Secondary | ICD-10-CM

## 2016-12-28 DIAGNOSIS — M25531 Pain in right wrist: Secondary | ICD-10-CM | POA: Diagnosis not present

## 2016-12-28 NOTE — Progress Notes (Signed)
Office Visit Note   Patient: Craig [name]           Date of Birth: 06/28/1961           MRN: [id number] Visit Date: 12/28/2016              Requested by: Virginia Crews, MD [place], [place] [id number] PCP: Virginia Crews, MD   Assessment & Plan: Visit Diagnoses:  1. Bilateral wrist pain     Plan: I did review the x-rays which shows degenerative changes of the radiocarpal joint. 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 I told him that I do not treat this problem and recommended Dr. Grandville Silos at [facility] which she agreed to. We will make the referral. Follow-up with me as needed.  Follow-Up Instructions: Return if symptoms worsen or fail to improve.   Orders:  Orders Placed This Encounter  Procedures  . Ambulatory referral to Orthopedic Surgery   No orders of the defined types were placed in this encounter.     Procedures: No procedures performed   Clinical Data: No additional findings.   Subjective: Chief Complaint  Patient presents with  . Right Hand - Pain  . Left Hand - Pain    Patient is a 56 year old gentleman with bilateral wrist pain worse on the right. He was ruled out for carpal tunnel syndrome by nerve conduction studies at Dr. Levell July office. 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 Levell July notes. An MRI was ordered but the patient was unable to tolerate the study. He comes in today for a second opinion. He denies any numbness or dropping things. He does have pain and discomfort with use of the hand and the wrist.    Review of Systems  Constitutional: Negative.   All other systems reviewed and are negative.    Objective: Vital Signs: There were no vitals taken for this visit.  Physical Exam  Constitutional: He is oriented to person, place, and time. He appears well-developed and well-nourished.  HENT:  Head: Normocephalic and atraumatic.  Eyes: Pupils are  equal, round, and reactive to light.  Neck: Neck supple.  Pulmonary/Chest: Effort normal.  Abdominal: Soft.  Musculoskeletal: Normal range of motion.  Neurological: He is alert and oriented to person, place, and time.  Skin: Skin is warm.  Psychiatric: He has a normal mood and affect. His behavior is normal. Judgment and thought content normal.  Nursing note and vitals reviewed.   Ortho Exam Bilateral hand exam shows negative carpal tunnel compressive signs. He has no skin lesions. Range of motion of the wrists doesn't elicit significant pain. Specialty Comments:  No specialty comments available.  Imaging: No results found.   PMFS History: Patient Active Problem List   Diagnosis Date Noted  . Bilateral carpal tunnel syndrome 08/03/2016  . Anemia 12/15/2014  . Preventative health care 01/27/2014  . Hyperlipidemia 01/01/2013  . Type 2 diabetes mellitus, controlled ([place]) 09/17/2012  . Morbid obesity with BMI of 60.0-69.9, adult ([place]) 06/05/2012  . Hypertension goal BP (blood pressure) < 140/90 04/30/2012  . GERD (gastroesophageal reflux disease) 03/27/2012  . History of gastric cancer 02/14/2012   Past Medical History:  Diagnosis Date  . Cancer [facility]) abdomen    Family History  Problem Relation Age of Onset  . Diabetes type II Mother     Past Surgical History:  Procedure Laterality Date  . ABDOMINAL SURGERY     Social  History   Occupational History  . Not on file.   Social History Main Topics  . Smoking status: Former Smoker    Start date: 08/14/1977    Quit date: 08/14/1996  . Smokeless tobacco: Never Used  . Alcohol use No  . Drug use: Yes    Types: Cocaine, Marijuana, Heroin     Comment: Previous Hx of substance abuse  . Sexual activity: Yes

## 2017-01-16 ENCOUNTER — Encounter: Payer: Self-pay | Admitting: Pharmacist

## 2017-01-16 ENCOUNTER — Encounter: Payer: Self-pay | Admitting: Family Medicine

## 2017-01-16 ENCOUNTER — Ambulatory Visit (INDEPENDENT_AMBULATORY_CARE_PROVIDER_SITE_OTHER): Payer: Medicaid Other | Admitting: Pharmacist

## 2017-01-16 DIAGNOSIS — E1165 Type 2 diabetes mellitus with hyperglycemia: Secondary | ICD-10-CM

## 2017-01-16 NOTE — Assessment & Plan Note (Signed)
Diabetes longstanding diabetes currently with excellent control on metformin monotherapy (no longer taking any insulin).  Patient denies hypoglycemic events and is able to verbalize appropriate hypoglycemia management plan. Patient reports adherence with medication. Control is suboptimal due to obesity and sedentary lifestyle.  Encouraged to workout: Zumba Dance 2 days or more next week.  Bowling - Once per week or more - two is your goal.  Walking - at least 4 blocks - at least 3 per week.   Goal weight next visit  With Dr. Brita Romp - 10 pounds.

## 2017-01-16 NOTE — Patient Instructions (Signed)
Great to see you today!  Restart workout plan - Zumba Dance 2 days or more NEXT week.   Bowling - Once per week or more - two is your goal.   Walking - at least 4 blocks - at least 3 per week.   Goal weight next visit  With Dr. Brita Romp - 10 pounds.

## 2017-01-16 NOTE — Progress Notes (Signed)
    S:     Chief Complaint  Patient presents with  . Medication Management    diabetes    Patient arrives in joyful mood, and ambulates without assistance.  Presents for diabetes evaluation, education, and management after visit with Dr. Brita Romp his PCP in April.    Patient reports adherence with medications.  Current diabetes medications include: metformin - no longer taking any insulin.  Current hypertension medications include: losartan/hctz and carvedilol.   Patient denies hypoglycemic events.  Patient reported dietary habits: Eats 2-3 meals/day  Patient reported exercise habits: limited recently due to housing issues and limited workout access.  Discussed extensively current and goal activities.    O:  Physical Exam  Constitutional: He appears well-developed and well-nourished.  Cardiovascular: Normal rate.   Vitals reviewed.    ROS   Lab Results  Component Value Date   HGBA1C 5.8 10/31/2016   Vitals:   01/16/17 1349  BP: 138/68  Pulse: 77    Home fasting CBG: 80-110    A/P: Diabetes longstanding diabetes currently with excellent control on metformin monotherapy (no longer taking any insulin).  Patient denies hypoglycemic events and is able to verbalize appropriate hypoglycemia management plan. Patient reports adherence with medication. Control is suboptimal due to obesity and sedentary lifestyle.  Encouraged to workout: Zumba Dance 2 days or more next week.  Bowling - Once per week or more - two is your goal.  Walking - at least 4 blocks - at least 3 per week.   Goal weight next visit  With Dr. Brita Romp - 10 pounds.   Next vsit with Dr. Brita Romp in June.  Written patient instructions provided.  Total time in face to face counseling 30 minutes.  Follow up in Pharmacist Clinic Visit PRN per PCP.  Patient seen with Shelle Iron, PharmD Candidate.

## 2017-01-17 NOTE — Progress Notes (Signed)
Patient ID: Craig [name], male   DOB: March 04, 1961, 56 y.o.   MRN: [id number] Reviewed: Agree with Dr. Graylin Shiver documentation and assessment.

## 2017-02-08 ENCOUNTER — Ambulatory Visit (INDEPENDENT_AMBULATORY_CARE_PROVIDER_SITE_OTHER): Payer: Medicaid Other | Admitting: Family Medicine

## 2017-02-08 ENCOUNTER — Encounter: Payer: Self-pay | Admitting: Family Medicine

## 2017-02-08 VITALS — BP 128/82 | HR 59 | Temp 98.5°F | Ht 70.0 in | Wt 383.6 lb

## 2017-02-08 DIAGNOSIS — Z6841 Body Mass Index (BMI) 40.0 and over, adult: Secondary | ICD-10-CM

## 2017-02-08 DIAGNOSIS — E785 Hyperlipidemia, unspecified: Secondary | ICD-10-CM | POA: Diagnosis not present

## 2017-02-08 DIAGNOSIS — I10 Essential (primary) hypertension: Secondary | ICD-10-CM | POA: Diagnosis not present

## 2017-02-08 DIAGNOSIS — E1165 Type 2 diabetes mellitus with hyperglycemia: Secondary | ICD-10-CM

## 2017-02-08 LAB — POCT GLYCOSYLATED HEMOGLOBIN (HGB A1C): HEMOGLOBIN A1C: 5.9

## 2017-02-08 MED ORDER — ASPIRIN 81 MG PO TBEC: DELAYED_RELEASE_TABLET | ORAL | 3 refills | Status: DC

## 2017-02-08 MED ORDER — LOSARTAN POTASSIUM-HCTZ 50-12.5 MG PO TABS: 1.0000 | ORAL_TABLET | Freq: Every day | ORAL | 3 refills | Status: DC

## 2017-02-08 MED ORDER — PANTOPRAZOLE SODIUM 40 MG PO TBEC: DELAYED_RELEASE_TABLET | ORAL | 3 refills | Status: DC

## 2017-02-08 MED ORDER — ATORVASTATIN CALCIUM 80 MG PO TABS: 80.0000 mg | ORAL_TABLET | Freq: Every day | ORAL | 3 refills | Status: DC

## 2017-02-08 MED ORDER — METFORMIN HCL 1000 MG PO TABS: 1000.0000 mg | ORAL_TABLET | Freq: Two times a day (BID) | ORAL | 3 refills | Status: DC

## 2017-02-08 MED ORDER — GABAPENTIN 300 MG PO CAPS
300.0000 mg | ORAL_CAPSULE | Freq: Three times a day (TID) | ORAL | 2 refills | Status: DC
Start: 2017-02-08 — End: 2017-07-25

## 2017-02-08 MED ORDER — CARVEDILOL 3.125 MG PO TABS: 3.1250 mg | ORAL_TABLET | Freq: Two times a day (BID) | ORAL | 11 refills | Status: DC

## 2017-02-08 NOTE — Assessment & Plan Note (Signed)
Well controlled. Continue current meds

## 2017-02-08 NOTE — Patient Instructions (Signed)
Nice to see you again today.  Try out Dr Yisroel Ramming.  I will be at [facility].  [phone].  Come back in 3 months.  No medicine changes today.  Take care, Dr. Jacinto Reap

## 2017-02-08 NOTE — Assessment & Plan Note (Signed)
Discussed diet and exercise Congratulated patient on his recent weight loss and encouraged him to keep up the work We will readdress option of bariatric surgery after carpal tunnel syndrome is under better control as per patient wishes

## 2017-02-08 NOTE — Progress Notes (Signed)
Subjective:   Craig [name] is a 56 y.o. male with a history of HTN, T2 DM, HLD, morbid obesity, carpal tunnel syndrome, GERD here for chronic disease management follow-up  HTN: - Medications: Coreg 3.125mg  BID, losartan HCTZ 50-12.5 mg daily - Compliance: Good - Checking BP at home: No - Denies any SOB, CP, vision changes, LE edema, medication SEs, or symptoms of hypotension  T2DM - Checking BG at home: yes - fastings 80-130 - Medications: metformin 1000mg  BID - Compliance: Good - eye exam: 5/18 - foot exam: 01/2016 - microalbumin: NA, ARB - denies symptoms of hypoglycemia, polyuria, polydipsia, numbness extremities, foot ulcers/trauma  HLD - medications: lipitor 80mg  daily - also taking daily baby aspirin - compliance: good - medication SEs: none  Morbid obesity - tried buproprion XL - no help and caused dry mouth - weight had been increasing over last 5 visits - but now has lost 4 pounds in the last 2 months - dietary habits: eats 1 larger meal with 2 glucerna shakes at other times during the day, switched to doing mostly smoothies and purees - this is difficult due to having to stay in hotel while apartment is being renovated - doesn't feel like nutrition was helpful - exercise habits: walking qod 5-10 min, uses [employer] gym 81min/day (missed months due to worry about hand pain, started back yesterday), now going to the [facility] about 5 times weekly - Reports that he used to weigh about 600 pounds and is now down to 384 - thinking about bariatric surgery - willing to discuss further after his hand pain is dealt with  Review of Systems:  Per HPI.   Social History: Former smoker  Objective:  BP 128/82   Pulse (!) 59   Temp 98.5 F (36.9 C) (Oral)   Ht 5\' 10"  (1.778 m)   Wt (!) 383 lb 9.6 oz (174 kg)   SpO2 95%   BMI 55.04 kg/m   Gen:  56 y.o. male in NAD, Morbidly obese HEENT: NCAT, MMM, EOMI, PERRL, anicteric sclerae CV: RRR, no MRG Resp: Non-labored, CTAB, no  wheezes noted Abd: Soft, NTND, BS present, no guarding or organomegaly Ext: WWP, no edema MSK: gait intact Neuro: Alert and oriented, speech normal       Chemistry      Component Value Date/Time   NA 143 11/28/2016 1454   K 4.4 11/28/2016 1454   CL 102 11/28/2016 1454   CO2 28 11/28/2016 1454   BUN 9 11/28/2016 1454   CREATININE 0.83 11/28/2016 1454   CREATININE 1.02 02/04/2016 1115      Component Value Date/Time   CALCIUM 8.9 11/28/2016 1454   ALKPHOS 92 01/27/2014 1329   AST 19 01/27/2014 1329   ALT 22 01/27/2014 1329   BILITOT 0.3 01/27/2014 1329      Lab Results  Component Value Date   WBC 7.8 12/14/2014   HGB 12.0 (L) 12/14/2014   HCT 37.6 (L) 12/14/2014   MCV 85.5 12/14/2014   PLT 312 12/14/2014   No results found for: TSH Lab Results  Component Value Date   HGBA1C 5.9 02/08/2017   Assessment & Plan:     Craig [name] is a 56 y.o. male here for   Type 2 diabetes mellitus, controlled ([place]) Well controlled Continue Metformin F/u in 3 months Advised on diet and exercise  Hypertension goal BP (blood pressure) < 140/90 Well controlled Continue current meds  Hyperlipidemia Continue atorvastatin  Morbid obesity with BMI of 60.0-69.9, adult  Discussed diet and exercise Congratulated patient on his recent weight loss and encouraged him to keep up the work We will readdress option of bariatric surgery after carpal tunnel syndrome is under better control as per patient wishes    Virginia Crews, MD MPH PGY-3,  [facility] 02/08/2017  3:51 PM

## 2017-02-08 NOTE — Assessment & Plan Note (Signed)
Continue atorvastatin

## 2017-02-08 NOTE — Assessment & Plan Note (Signed)
Well controlled Continue Metformin F/u in 3 months Advised on diet and exercise

## 2017-02-18 ENCOUNTER — Other Ambulatory Visit: Payer: Self-pay | Admitting: Family Medicine

## 2017-02-18 DIAGNOSIS — M25562 Pain in left knee: Principal | ICD-10-CM

## 2017-02-18 DIAGNOSIS — M25561 Pain in right knee: Secondary | ICD-10-CM

## 2017-02-21 NOTE — Telephone Encounter (Signed)
Pt called because the pharmacy has been faxing [place] for 3 days for a refill on the patients Ibuprofen. Can we call this in since he has been waiting. Also can we call the patient so that he knows we have sent his medication. jw

## 2017-03-25 ENCOUNTER — Encounter: Payer: Self-pay | Admitting: Family Medicine

## 2017-03-25 NOTE — Progress Notes (Signed)
Subjective:   Patient ID: Craig [name]    DOB: 1961-05-26, 56 y.o. male   MRN: [id number]  CC: "Establish care"  HPI: Craig [name] is a 56 y.o. male who presents to clinic today to establish care with new PCP. Problems discussed today are as follows:  Paresthesia: Patient recently seen for right hand numbness on the thenar eminence sparing left hand. Currently taking gabapentin 300 mg 3 times daily with 50% improvement over the past 2 months. Patient denies sedation with medication. He is pleased with the results and is making efforts to continue bowling 3 times weekly.  ROS: Denies loss of motor function of hand.  Hypertension: Patient states he ran out of his Hyzaar as of today. Patient took medication this morning. States his blood pressure seems to be well-controlled in the past. Nonsmoker. Making efforts to lose weight goal for bariatric surgery.  History of gastric cancer: Patient has a history of gastric cancer by chart review around 2012/2013 at which point he underwent abdominal surgery but failed to follow up with GI. This was done in [place] and he has since moved to [place] without follow-up. Patient is endorsing intentional weight loss with goal low 300 pounds in order to undergo bariatric surgery. ROS: Denies unintentional weight loss, dysphagia, abdominal pain, nausea or vomiting.  Complete ROS performed, see HPI for pertinent.  [place]: Morbid obesity (BMI 55), history of gastric cancer, NIDDM, carpal tunnel sydrome. Surgical abdominal. Family history DM. Smoking status reviewed. Medications reviewed.  Objective:   BP 118/76   Pulse 98   Temp (!) 97.5 F (36.4 C) (Oral)   Ht 5\' 10"  (1.778 m)   Wt (!) 388 lb (176 kg)   BMI 55.67 kg/m  Vitals and nursing note reviewed.  General: morbidly obese, well nourished, well developed, in no acute distress with non-toxic appearance Neck: supple, non-tender without lymphadenopathy, no virchow node palpable CV:  regular rate and rhythm without murmurs, rubs, or gallops, no lower extremity edema Lungs: clear to auscultation bilaterally with normal work of breathing Abdomen: soft, non-tender, non-distended, no masses or organomegaly palpable, normoactive bowel sounds Skin: warm, dry, no rashes or lesions, cap refill < 2 seconds Extremities: warm and well perfused, normal tone  Assessment & Plan:   Right hand paresthesia Chronic. Localized 2) or eminence without evident atrophy. Seems to respond well to gabapentin. Patient is a right-handed avid bowler which could contribute or exacerbate underlying source. --Recommended restricting frequency of bowling --Continue gabapentin 300 mg 3 times daily  History of gastric cancer Remote history by chart review. Patient unable to give clear history on diagnosis. Was diagnosed in 2012/2013 in [place] and underwent laparoscopic surgery though this is not clear. Patient was lost to follow-up after surgery. --Ambulatory referral to GI for evaluation of history of gastric cancer --Release of information signed, will try and obtain results from records  Primary hypertension Chronic. Controlled. Currently on Hyzaar. Nonsmoker. --Refill place for Hyzaar 50-12.5 mg daily  Orders Placed This Encounter  Procedures  . Ambulatory referral to Gastroenterology    Referral Priority:   Elective    Referral Type:   Consultation    Referral Reason:   Specialty Services Required    Number of Visits Requested:   1   Meds ordered this encounter  Medications  . losartan-hydrochlorothiazide (HYZAAR) 50-12.5 MG tablet    Sig: Take 1 tablet by mouth daily.    Dispense:  90 tablet    Refill:  3  Harriet Butte, [place], PGY-2 03/26/2017 5:40 PM

## 2017-03-26 ENCOUNTER — Ambulatory Visit (INDEPENDENT_AMBULATORY_CARE_PROVIDER_SITE_OTHER): Payer: Medicaid Other | Admitting: Family Medicine

## 2017-03-26 ENCOUNTER — Encounter: Payer: Self-pay | Admitting: Family Medicine

## 2017-03-26 VITALS — BP 118/76 | HR 98 | Temp 97.5°F | Ht 70.0 in | Wt 388.0 lb

## 2017-03-26 DIAGNOSIS — Z85028 Personal history of other malignant neoplasm of stomach: Secondary | ICD-10-CM | POA: Diagnosis present

## 2017-03-26 DIAGNOSIS — I10 Essential (primary) hypertension: Secondary | ICD-10-CM | POA: Diagnosis not present

## 2017-03-26 DIAGNOSIS — R202 Paresthesia of skin: Secondary | ICD-10-CM | POA: Insufficient documentation

## 2017-03-26 HISTORY — DX: Paresthesia of skin: R20.2

## 2017-03-26 MED ORDER — LOSARTAN POTASSIUM-HCTZ 50-12.5 MG PO TABS: 1.0000 | ORAL_TABLET | Freq: Every day | ORAL | 3 refills | Status: DC

## 2017-03-26 NOTE — Assessment & Plan Note (Addendum)
Chronic. Localized 2) or eminence without evident atrophy. Seems to respond well to gabapentin. Patient is a right-handed avid bowler which could contribute or exacerbate underlying source. --Recommended restricting frequency of bowling --Continue gabapentin 300 mg 3 times daily

## 2017-03-26 NOTE — Patient Instructions (Signed)
Thank you for coming in to see [place] today. Please see below to review our plan for today's visit.  1. Your blood pressure is well controlled. I have refilled a prescription for your Hyzaar. 2. We will work towards achieving her weight loss goal of below 300 pounds. In the meantime I strongly encourage you to follow up with gastroenterology concerning his history of gastric cancer. It is important to make sure that this is not still present as this can make complications further down the road especially with gastric surgery. 3. Continue the gabapentin 300 mg 3 times daily.  Return to clinic in one month.  Please call the clinic at [phone] if your symptoms worsen or you have any concerns. It was my pleasure to see you. -- Harriet Butte, [place], PGY-2

## 2017-03-26 NOTE — Assessment & Plan Note (Addendum)
Remote history by chart review. Patient unable to give clear history on diagnosis. Was diagnosed in 2012/2013 in [place] and underwent laparoscopic surgery though this is not clear. Patient was lost to follow-up after surgery. --Ambulatory referral to GI for evaluation of history of gastric cancer --Release of information signed, will try and obtain results from records

## 2017-03-26 NOTE — Assessment & Plan Note (Addendum)
Chronic. Controlled. Currently on Hyzaar. Nonsmoker. --Refill place for Hyzaar 50-12.5 mg daily

## 2017-04-09 ENCOUNTER — Ambulatory Visit: Payer: Medicaid Other | Admitting: Pharmacist

## 2017-04-23 ENCOUNTER — Other Ambulatory Visit: Payer: Self-pay | Admitting: *Deleted

## 2017-04-23 DIAGNOSIS — I10 Essential (primary) hypertension: Secondary | ICD-10-CM

## 2017-04-24 ENCOUNTER — Ambulatory Visit: Payer: Medicaid Other | Admitting: Family Medicine

## 2017-04-24 MED ORDER — CARVEDILOL 3.125 MG PO TABS: 3.1250 mg | ORAL_TABLET | Freq: Two times a day (BID) | ORAL | 11 refills | Status: DC

## 2017-04-25 ENCOUNTER — Encounter: Payer: Self-pay | Admitting: Family Medicine

## 2017-04-25 ENCOUNTER — Ambulatory Visit (INDEPENDENT_AMBULATORY_CARE_PROVIDER_SITE_OTHER): Payer: Medicaid Other | Admitting: Family Medicine

## 2017-04-25 VITALS — BP 134/78 | HR 71 | Temp 98.0°F | Wt 383.0 lb

## 2017-04-25 DIAGNOSIS — Z6841 Body Mass Index (BMI) 40.0 and over, adult: Secondary | ICD-10-CM

## 2017-04-25 DIAGNOSIS — R202 Paresthesia of skin: Secondary | ICD-10-CM | POA: Diagnosis not present

## 2017-04-25 DIAGNOSIS — B353 Tinea pedis: Secondary | ICD-10-CM

## 2017-04-25 MED ORDER — TERBINAFINE HCL 1 % EX CREA: 1.0000 "application " | TOPICAL_CREAM | Freq: Two times a day (BID) | CUTANEOUS | 1 refills | Status: DC

## 2017-04-25 NOTE — Assessment & Plan Note (Addendum)
Chronic. Controlled. Symptoms significantly improved with gabapentin and splint. Patient is right-handed and an avid bowler. Patient working on improving intrinsic muscles of hand with exercise ball. --Advised patient to continue using splints at night and gabapentin 300 mg 3 times daily

## 2017-04-25 NOTE — Progress Notes (Signed)
   Subjective:   Patient ID: Craig [name]    DOB: Oct 31, 1960, 56 y.o. male   MRN: [id number]  CC: "Needs medication refills"  HPI: Craig [name] is a 56 y.o. male who presents to clinic today for medication refill. Problems discussed today are as follows:  Obesity: Patient has lost 5 pounds since last visit 1 month. He has increased frequency bowling. He is also increased consider eating salads and substituting his hunger with Glucerna shakes.  ROS: Denies fevers or chills, chest pain, shortness of breath, abdominal pain, nausea or vomiting.  Athlete's foot: Chronic issue on dorsal aspect of bilateral feet. States it's well-controlled with Lamisil but has ran out of prescription. Occasionally itches but is nonpainful.  Right hand paresthesia: Improving since last visit. Patient has increased his frequency bowling to 4 times per week averaging 4-6 games per session. He is pleased with this as this is his primary means of exercise. He states his gabapentin is helping significantly with his wrist. ROS: Denies loss of motor strength, catching of the joints.  Complete ROS performed, see HPI for pertinent.  [place]: Morbid obesity (BMI 55), history of gastric cancer, NIDDM, carpal tunnel sydrome. Surgical abdominal. Family history DM. Smoking status reviewed. Medications reviewed.  Objective:   BP 134/78   Pulse 71   Temp 98 F (36.7 C) (Oral)   Wt (!) 383 lb (173.7 kg)   SpO2 98%   BMI 54.95 kg/m  Vitals and nursing note reviewed.  General: morbidly obese, well nourished, well developed, in no acute distress with non-toxic appearance HEENT: normocephalic, atraumatic, moist mucous membranes CV: regular rate and rhythm without murmurs, rubs, or gallops, no lower extremity edema Lungs: clear to auscultation bilaterally with normal work of breathing Abdomen: soft, non-tender, non-distended, no masses or organomegaly palpable, normoactive bowel sounds Skin: warm, dry, no rashes or lesions,  cap refill < 2 seconds Extremities: warm and well perfused, normal tone, foot exam performed with intact sensation and dried dorsal aspect of feet bilaterally consistent with tinea pedis  Assessment & Plan:   Tinea pedis of both feet Chronic. Uncontrolled. Previously on Lamisil, however prescription ran out. --Refill provided for Lamisil cream  Morbid obesity with BMI of 50.0-59.9, adult ([facility]) Chronic. Patient is down 5 lbs since visit one month ago. Making efforts to increase activity with bowling. Making diet choices by increasing frequency of salads and shakes. --Advised patient to continue diet and exercise habits --Patient to follow-up with GI to schedule appointment for endoscopy in middle October --RTC one month to discuss GI appointment, obtain A1c, LDL, and receive flu and pneumonia vaccine  Right hand paresthesia Chronic. Controlled. Symptoms significantly improved with gabapentin and splint. Patient is right-handed and an avid bowler. Patient working on improving intrinsic muscles of hand with exercise ball. --Advised patient to continue using splints at night and gabapentin 300 mg 3 times daily  No orders of the defined types were placed in this encounter.  Meds ordered this encounter  Medications  . terbinafine (LAMISIL AT) 1 % cream    Sig: Apply 1 application topically 2 (two) times daily.    Dispense:  30 g    Refill:  [place], [place], PGY-2 04/25/2017 2:55 PM

## 2017-04-25 NOTE — Patient Instructions (Addendum)
Thank you for coming in to see [place] today. Please see below to review our plan for today's visit.  1. Continue to remain active by bowling and going to the gym. You seem to be making improvements with your weight to keep it up! One of the best ways to help lose weight is to eat by portion control. Try and make half of your plate vegetable, one quarter meat, and one quarter carbs. 2. Schedule an appointment with GI to keep your endoscopy. I would like to see you in one month at which point we will give your vaccines and follow-up on your GI appointment. We will also obtain A1c and I will check your cholesterol level. 3. Go get you Carvedilol and take as prescribed.  Please call the clinic at [phone] if your symptoms worsen or you have any concerns. It was my pleasure to see you. -- Harriet Butte, [place], PGY-2

## 2017-04-25 NOTE — Assessment & Plan Note (Addendum)
Chronic. Uncontrolled. Previously on Lamisil, however prescription ran out. --Refill provided for Lamisil cream

## 2017-04-25 NOTE — Assessment & Plan Note (Addendum)
Chronic. Patient is down 5 lbs since visit one month ago. Making efforts to increase activity with bowling. Making diet choices by increasing frequency of salads and shakes. --Advised patient to continue diet and exercise habits --Patient to follow-up with GI to schedule appointment for endoscopy in middle October --RTC one month to discuss GI appointment, obtain A1c, LDL, and receive flu and pneumonia vaccine

## 2017-05-14 ENCOUNTER — Other Ambulatory Visit: Payer: Self-pay | Admitting: Internal Medicine

## 2017-05-17 ENCOUNTER — Encounter: Payer: Self-pay | Admitting: Family Medicine

## 2017-05-25 ENCOUNTER — Ambulatory Visit: Payer: Medicaid Other | Admitting: Family Medicine

## 2017-05-25 NOTE — Progress Notes (Deleted)
   Subjective:   Patient ID: Craig [name]    DOB: 1960-11-09, 56 y.o. male   MRN: [id number]  CC: "***"  HPI: Craig [name] is a 56 y.o. male who presents to clinic today ***. Problems discussed today are as follows:  ***: *** ROS: ***  ***Seen by me one month ago. Given Lamisil cream for tinea pedis. Discussed diet and exercise. Instructed to follow-up with GI as scheduled point for endoscopy and middle October. Needs A1c, LDL, flu and pneumonia shot.  Complete ROS performed, see HPI for pertinent.  [place]: Morbid obesity (BMI 55), history of gastric cancer, NIDDM, carpal tunnel sydrome. Surgical abdominal. Family history DM. Smoking status reviewed. Medications reviewed.  Objective:   There were no vitals taken for this visit. Vitals and nursing note reviewed.  General: well nourished, well developed, in no acute distress with non-toxic appearance HEENT: normocephalic, atraumatic, moist mucous membranes Neck: supple, non-tender without lymphadenopathy CV: regular rate and rhythm without murmurs, rubs, or gallops, no lower extremity edema Lungs: clear to auscultation bilaterally with normal work of breathing Abdomen: soft, non-tender, non-distended, no masses or organomegaly palpable, normoactive bowel sounds Skin: warm, dry, no rashes or lesions, cap refill < 2 seconds Extremities: warm and well perfused, normal tone  Assessment & Plan:   No problem-specific Assessment & Plan notes found for this encounter.  No orders of the defined types were placed in this encounter.  No orders of the defined types were placed in this encounter.   Harriet Butte, [place], PGY-2 05/25/2017 12:43 PM

## 2017-07-12 NOTE — Progress Notes (Signed)
   Subjective   Patient ID: Craig [name]    DOB: 01/24/1961, 56 y.o. male   MRN: [id number]  CC: "Follow-up"  HPI: Craig [name] is a 56 y.o. male who presents to clinic today for the following:  History of gastric cancer: Patient referred to GI but failed to follow-up in mid October.  Says "something happens " and he was unable to go to the appointment and has not heard since about rescheduling.  He denies nausea or vomiting, change in weight, constipation or diarrhea, melena or hematochezia.  Hyperlipidemia: Currently tolerating his atorvastatin.  Not making efforts to reduce weight.  Diabetes: Tolerating metformin.  Does not check glucose levels.  A1c controlled during last visit in June.  Left leg cramp: Onset 2 days ago affecting left medial lower extremity radiating to anterior left shin.  Patient denies associated numbness or weakness to leg.  He denies history of similar problem.  He states this tends to affect him when he lies down at night.  ROS: see HPI for pertinent.  [place]: Morbid obesity (BMI 55), history of gastric cancer, NIDDM, carpal tunnel sydrome. Surgical abdominal. Family history DM. Smoking status reviewed. Medications reviewed.  Objective   BP 116/78   Pulse 60   Temp 98 F (36.7 C) (Oral)   Ht 5\' 10"  (1.778 m)   Wt (!) 389 lb 6.4 oz (176.6 kg)   SpO2 98%   BMI 55.87 kg/m  Vitals and nursing note reviewed.  General: morbidly obese, NAD with non-toxic appearance HEENT: normocephalic, atraumatic, moist mucous membranes Cardiovascular: regular rate and rhythm without murmurs, rubs, or gallops Lungs: clear to auscultation bilaterally with normal work of breathing Abdomen: soft, non-tender, non-distended, normoactive bowel sounds Skin: warm, dry, no rashes or lesions, cap refill < 2 seconds Extremities: warm and well perfused, normal tone, no edema  Assessment & Plan   Leg cramps Acute.  Uncertain etiology though limited to only left leg.  Differential  includes electrolyte abnormalities, RLS, muscle strain.  Patient is morbidly obese which may be contributing to discomfort in lower extremity. - Will obtain BMET and magnesium level  History of gastric cancer Uncertain history of possible gastric cancer.  No red flags presently.  No history of documented colonoscopy.  Patient seems apprehensive about GI follow-up and failed to schedule appointment.  - Placed new ambulatory referral to GI for evaluation for colonoscopy/EGD  Type 2 diabetes mellitus, controlled ([place]) Chronic.  Controlled, A1c 5.9.  Does not check CBGs.  Currently on metformin without need for insulin. - Continue metformin 1000 mg daily - RTC 3 months  Hyperlipidemia associated with type 2 diabetes mellitus ([facility]) Chronic.  On high intensity statin.  Has diagnosis of diabetes, well controlled.  Previous LDL controlled last year. - Will obtain LDL - Continue atorvastatin 80 mg daily  Orders Placed This Encounter  Procedures  . Basic metabolic panel  . Magnesium  . LDL cholesterol, direct  . Ambulatory referral to Gastroenterology    Referral Priority:   Routine    Referral Type:   Consultation    Referral Reason:   Specialty Services Required    Number of Visits Requested:   1  . POCT glycosylated hemoglobin (Hb A1C)   No orders of the defined types were placed in this encounter.   Harriet Butte, [place], PGY-2 07/13/2017, 5:28 PM

## 2017-07-13 ENCOUNTER — Encounter: Payer: Self-pay | Admitting: Family Medicine

## 2017-07-13 ENCOUNTER — Ambulatory Visit: Payer: Medicaid Other | Admitting: Family Medicine

## 2017-07-13 ENCOUNTER — Ambulatory Visit (INDEPENDENT_AMBULATORY_CARE_PROVIDER_SITE_OTHER): Payer: Medicaid Other | Admitting: *Deleted

## 2017-07-13 ENCOUNTER — Other Ambulatory Visit: Payer: Self-pay

## 2017-07-13 VITALS — BP 116/78 | HR 60 | Temp 98.0°F | Ht 70.0 in | Wt 389.4 lb

## 2017-07-13 DIAGNOSIS — Z23 Encounter for immunization: Secondary | ICD-10-CM | POA: Diagnosis not present

## 2017-07-13 DIAGNOSIS — R252 Cramp and spasm: Secondary | ICD-10-CM | POA: Insufficient documentation

## 2017-07-13 DIAGNOSIS — Z85028 Personal history of other malignant neoplasm of stomach: Secondary | ICD-10-CM | POA: Diagnosis not present

## 2017-07-13 DIAGNOSIS — E1165 Type 2 diabetes mellitus with hyperglycemia: Secondary | ICD-10-CM

## 2017-07-13 DIAGNOSIS — E785 Hyperlipidemia, unspecified: Secondary | ICD-10-CM

## 2017-07-13 DIAGNOSIS — E1169 Type 2 diabetes mellitus with other specified complication: Secondary | ICD-10-CM | POA: Diagnosis not present

## 2017-07-13 LAB — POCT GLYCOSYLATED HEMOGLOBIN (HGB A1C): Hemoglobin A1C: 5.9

## 2017-07-13 NOTE — Assessment & Plan Note (Addendum)
Chronic.  Controlled, A1c 5.9.  Does not check CBGs.  Currently on metformin without need for insulin. - Continue metformin 1000 mg daily - RTC 3 months

## 2017-07-13 NOTE — Assessment & Plan Note (Addendum)
Acute.  Uncertain etiology though limited to only left leg.  Differential includes electrolyte abnormalities, RLS, muscle strain.  Patient is morbidly obese which may be contributing to discomfort in lower extremity. - Will obtain BMET and magnesium level

## 2017-07-13 NOTE — Assessment & Plan Note (Addendum)
Uncertain history of possible gastric cancer.  No red flags presently.  No history of documented colonoscopy.  Patient seems apprehensive about GI follow-up and failed to schedule appointment.  - Placed new ambulatory referral to GI for evaluation for colonoscopy/EGD

## 2017-07-13 NOTE — Patient Instructions (Addendum)
Thank you for coming in to see [place] today. Please see below to review our plan for today's visit.  1.  Placed a new referral for you to see GI.  They closed the initial referral because they state that you declined to reschedule it back in October.  I emphasized the importance of getting her screening colonoscopy and the importance of following up. 2.  I will call you if your blood work is abnormal.  Otherwise expect to receive the results in the mail. 3.  Continue to drink plenty of fluids throughout the day.  I would advise you to fill a large water bottle and drink 3-4 bottles per day. 4.  You can come in next week to get your pneumonia shot.  Just come to the front desk and request to get the shot.  You will not need to see me or another provider to get this vaccine.  Please call the clinic at [phone] if your symptoms worsen or you have any concerns. It was our pleasure to serve you.  Harriet Butte, [place], PGY-2

## 2017-07-13 NOTE — Assessment & Plan Note (Addendum)
Chronic.  On high intensity statin.  Has diagnosis of diabetes, well controlled.  Previous LDL controlled last year. - Will obtain LDL - Continue atorvastatin 80 mg daily

## 2017-07-14 LAB — BASIC METABOLIC PANEL
BUN/Creatinine Ratio: 10 (ref 9–20)
BUN: 9 mg/dL (ref 6–24)
CALCIUM: 8.8 mg/dL (ref 8.7–10.2)
CHLORIDE: 99 mmol/L (ref 96–106)
CO2: 28 mmol/L (ref 20–29)
Creatinine, Ser: 0.93 mg/dL (ref 0.76–1.27)
GFR calc Af Amer: 107 mL/min/{1.73_m2} (ref >59)
GFR calc non Af Amer: 93 mL/min/{1.73_m2} (ref >59)
GLUCOSE: 80 mg/dL (ref 65–99)
POTASSIUM: 4.6 mmol/L (ref 3.5–5.2)
Sodium: 140 mmol/L (ref 134–144)

## 2017-07-14 LAB — MAGNESIUM: MAGNESIUM: 1.4 mg/dL — AB (ref 1.6–2.3)

## 2017-07-14 LAB — LDL CHOLESTEROL, DIRECT: LDL DIRECT: 90 mg/dL (ref 0–99)

## 2017-07-17 ENCOUNTER — Telehealth: Payer: Self-pay | Admitting: Family Medicine

## 2017-07-17 NOTE — Telephone Encounter (Signed)
Contacted patient regarding hypomagnesemia labs following new onset cramps.  No answer, left voicemail recommending over-the-counter OTC magnesium supplements.  Harriet Butte, [place], PGY-2

## 2017-07-19 ENCOUNTER — Ambulatory Visit: Payer: Medicaid Other | Admitting: Pharmacist

## 2017-07-25 ENCOUNTER — Other Ambulatory Visit: Payer: Self-pay | Admitting: Family Medicine

## 2017-07-25 DIAGNOSIS — R202 Paresthesia of skin: Secondary | ICD-10-CM

## 2017-07-25 MED ORDER — GABAPENTIN 300 MG PO CAPS: 300.0000 mg | ORAL_CAPSULE | Freq: Three times a day (TID) | ORAL | 2 refills | Status: DC

## 2017-08-02 ENCOUNTER — Encounter: Payer: Self-pay | Admitting: Pharmacist

## 2017-08-02 ENCOUNTER — Ambulatory Visit: Payer: Medicaid Other | Admitting: Pharmacist

## 2017-08-02 DIAGNOSIS — E1165 Type 2 diabetes mellitus with hyperglycemia: Secondary | ICD-10-CM

## 2017-08-02 NOTE — Progress Notes (Signed)
    S:     Chief Complaint  Patient presents with  . Medication Management    diabetes    Patient arrives in good spirits, ambulating without assistance.  Presents for diabetes evaluation, education, and management after visit with PCP Dr. Yisroel Ramming. Patient is well known to pharmacy clinic.  Patient reports he is doing well, reports well controlled CBGs, denies hypoglycemia, and reports adherence to medications. Patients states he has continued to bowl three times weekly and is now swimming (water aerobics) once weekly.   Of note, patient shares that he has feelings of "sadness and anxiety attacks" lately on days he is not bowling. He cites loneliness as the reason for these feelings and reports this time of year has always been hardest since his wife and children died 10/28/1998). Denies SI/HI. Has been on bupropion before but stopped 2/2 intolerable dry mouth.  Patient has self-stated ideal weight of 300 lbs. Willing to increase exercise to achieve this.  Family/Social History: Used to live in [place], [place] where all of his family currently is. 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  Patient reports adherence with medications.  Current diabetes medications include: metformin 1000 mg BID Current hypertension medications include: losartan-HCTZ 50-12.5 mg daily, carvedilol 3.125 mg BID  Patient denies hypoglycemic events. O:  Physical Exam  Constitutional: He appears well-developed and well-nourished.     Review of Systems  All other systems reviewed and are negative.    Lab Results  Component Value Date   HGBA1C 5.9 07/13/2017   Vitals:   08/02/17 1610  BP: 112/84  Pulse: 65  SpO2: 96%   07/13/2017 eGFR = 107 ml/min  Home fasting CBG: 109 today, generally 87-120 per patient report  A/P: Diabetes longstanding/newly diagnosed currently well controlled as evidenced by CBGs and A1C. Patient denies hypoglycemic events and is able to verbalize appropriate hypoglycemia management plan. Patient  reports adherence with medication.  -Continue metformin 1000 mg BID -Made goal to increase exercise: bowling four times weekly, swimming once weekly, walking x30 mins two times weekly.  Short-term weight loss goal is 25 pounds in the upcoming year.  -Next A1C anticipated 2017/10/27  ASCVD risk greater than 7.5%. Continued Aspirin 81 mg and Continued atorvastatin 80 mg.   Hypertension longstanding diagnosed currently well controlled.  Patient reports adherence with medication. Continue current medications.  Complaints of sadness and anxiety - discussed this case with Dr. Erin Hearing, will speak with behavioral health and ask them to call him tomorrow. Advised that he make an appointment ASAP with Dr. Yisroel Ramming to consider pharmacologic intervention. Bupropion would be ideal however he has h/o intolerance to this. Recommend an agent with low potential for weight gain (avoid paroxetine).    Written patient instructions provided.  Total time in face to face counseling 25 minutes.   Follow up in Pharmacist Clinic Visit as needed.   Patient seen with Deirdre Pippins, PGY2 Pharmacy Resident, PharmD, BCPS

## 2017-08-02 NOTE — Assessment & Plan Note (Signed)
Diabetes longstanding/newly diagnosed currently well controlled as evidenced by CBGs and A1C. Patient denies hypoglycemic events and is able to verbalize appropriate hypoglycemia management plan. Patient reports adherence with medication.  -Continue metformin 1000 mg BID -Made goal to increase exercise: bowling four times weekly, swimming once weekly, walking x30 mins two times weekly.  Short-term weight loss goal is 25 pounds in the upcoming year.  -Next A1C anticipated March, 2019

## 2017-08-02 NOTE — Patient Instructions (Addendum)
Thanks for coming in to see [place]! You are doing so well with your diabetes.   Exercise plan: Bowling 4 days a week, swimming 1 day a week, and walking for 30 minutes 2 days a week.   We will ask behavioral health to give you a call to set up an appointment. Make an appointment to see Dr. Yisroel Ramming as soon as possible to discuss in early January to talk about this.   We will see you in the new year!

## 2017-08-03 ENCOUNTER — Telehealth: Payer: Self-pay | Admitting: Psychology

## 2017-08-03 NOTE — Telephone Encounter (Signed)
Called patient per Dr. Graylin Shiver request to schedule patient with [facility] to address depressed mood. Patient is scheduled for Friday Jan 4th at 10:30 AM.

## 2017-08-03 NOTE — Progress Notes (Signed)
Patient ID: Craig [name], male   DOB: [birth year], 56 y.o.   MRN: [id number] Reviewed: Agree with Dr. Graylin Shiver documentation and management.

## 2017-08-16 ENCOUNTER — Ambulatory Visit: Payer: Medicaid Other

## 2017-08-17 ENCOUNTER — Ambulatory Visit (INDEPENDENT_AMBULATORY_CARE_PROVIDER_SITE_OTHER): Payer: Medicaid Other | Admitting: Psychology

## 2017-08-17 DIAGNOSIS — F329 Major depressive disorder, single episode, unspecified: Secondary | ICD-10-CM | POA: Insufficient documentation

## 2017-08-17 DIAGNOSIS — F411 Generalized anxiety disorder: Secondary | ICD-10-CM

## 2017-08-17 DIAGNOSIS — F419 Anxiety disorder, unspecified: Secondary | ICD-10-CM

## 2017-08-17 DIAGNOSIS — F32A Depression, unspecified: Secondary | ICD-10-CM

## 2017-08-17 HISTORY — DX: Depression, unspecified: F32.A

## 2017-08-17 HISTORY — DX: Anxiety disorder, unspecified: F41.9

## 2017-08-17 NOTE — Assessment & Plan Note (Signed)
Assessment/Plan Recommendations:  [name] presented with heightened anxiety symtoms (Gad 7, score 13) and mild depressive symptoms (PHQ9, 7, denied item 9 SI). Craig [name]'s affect was appropriate and he appeared in high spirits discussing his bowling and sewing.  He has a limited close social network, which is impacting his symptoms but he appears motivated to improve and is working on expanding his friend group. It will be important for him to continue going bowling not just for the cardio benefits but also for the social aspect and [facility] reiterated this point.  [name] also discussed medication intervention for his anxiety and patient is potentially open to this if it would help. [name] will return to see [facility] in 4 weeks to monitor progress of his symptoms and [facility] will check-in about medication at that point again.

## 2017-08-17 NOTE — Progress Notes (Signed)
Reason for follow-up:  Craig [name] is following-up with [facility] for sadness and anxiety symptoms.   Issues discussed: Craig [name] discussed feeling lonely, which contributes to his mood and anxiety symptoms. He worries about being alone and not having anyone to check-in on him. He reports having a few friends but notes they don't check on him regulary. He goes to church and reports being reasonably close to his pastor, who check-in on him occasionally but does not have a close circle of friends at church. Craig [name] continues to go bowling to help with his weight management and he has made some acquaintances there and began to coordinate his bowling with them. He reports really enjoying bowling and has a goal of going 4x per week.  He denied taking medication for mood/anxiety but is open to this if it would help.  He also reported that when he feels down he pushes himself to engage socially. 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

## 2017-09-07 ENCOUNTER — Other Ambulatory Visit: Payer: Self-pay

## 2017-09-07 ENCOUNTER — Encounter: Payer: Self-pay | Admitting: Family Medicine

## 2017-09-07 ENCOUNTER — Ambulatory Visit: Payer: Medicaid Other | Admitting: Family Medicine

## 2017-09-07 VITALS — BP 122/70 | HR 65 | Temp 98.3°F | Ht 70.0 in | Wt 389.0 lb

## 2017-09-07 DIAGNOSIS — E1165 Type 2 diabetes mellitus with hyperglycemia: Secondary | ICD-10-CM | POA: Diagnosis not present

## 2017-09-07 DIAGNOSIS — F411 Generalized anxiety disorder: Secondary | ICD-10-CM | POA: Diagnosis not present

## 2017-09-07 MED ORDER — SERTRALINE HCL 50 MG PO TABS: ORAL_TABLET | ORAL | 0 refills | Status: DC

## 2017-09-07 MED ORDER — LIRAGLUTIDE 18 MG/3ML ~~LOC~~ SOPN: PEN_INJECTOR | SUBCUTANEOUS | 11 refills | Status: DC

## 2017-09-07 NOTE — Progress Notes (Signed)
Subjective   Patient ID: Craig [name]    DOB: 11-Mar-1961, 57 y.o. male   MRN: [id number]  CC: "Anxiety"  HPI: Craig [name] is a 57 y.o. male who presents to clinic today for the following:  Anxiety and depression: Patient experiencing gradual onset anxiety with some feelings of depression for approximately 1 month.  Patient states he felt "lost"and randomly started crying one day.  He states he is sad about the loss of his family from a motor vehicle accident back in 2000.  He denies history of suicidal or homicidal ideation.  Does not have any family in the area.  His primary support group are the people he bowls with regularly.  He is currently seeing behavioral medicine for therapy.  He is hopeful to continue this and would like to start medication if possible.  Diabetes: Patient interested in trying Victoza in addition to his metformin daily.  He has tried Victoza in the past but endorsed nausea and vomiting while taking the medication.  He believes this was a different medication he was taking at the same time.  He is hopeful that the Victoza will better control his diabetes and help him lose weight.  Need for colonoscopy screening: Patient was contacted by [facility].  States he did not have follow-up contact.  Denies melena or hematochezia, unexplained weight loss, or abdominal pain.  ROS: see HPI for pertinent.  [place]: Morbid obesity (BMI 55), history of gastric cancer, NIDDM, carpal tunnel sydrome. Surgical abdominal. Family history DM. Smoking status reviewed. Medications reviewed.  Objective   BP 122/70   Pulse 65   Temp 98.3 F (36.8 C) (Oral)   Ht 5\' 10"  (1.778 m)   Wt (!) 389 lb (176.4 kg)   SpO2 95%   BMI 55.82 kg/m  Vitals and nursing note reviewed.  General: morbidly obese, NAD with non-toxic appearance HEENT: normocephalic, atraumatic, moist mucous membranes Cardiovascular: regular rate and rhythm without murmurs, rubs, or gallops Lungs: clear to auscultation  bilaterally with normal work of breathing Abdomen: soft, non-tender, non-distended, normoactive bowel sounds Skin: warm, dry, no rashes or lesions, cap refill < 2 seconds Extremities: warm and well perfused, normal tone, no edema Psych: euthymic mood, congruent affect  Assessment & Plan   Generalized anxiety disorder Chronic.  Uncontrolled.  Seems to have predominantly anxiety with mixed depression.  Uncertain what the trigger is.  Has been seen by [facility].  Patient seems to be adherent to psychotherapy.  Patient interested in pharmacotherapy.  No history of SI/HI.  GAD-7 score 8, not difficult at all and PHQ-9 score 9, not difficult at all.  Seems to be relatively mild though I see medication would be appropriate at this point. - Initiating Zoloft 50 mg daily with increase to 100 mg daily after 1 week - Encourage patient to continue following behavioral therapy - RTC 1 month  No orders of the defined types were placed in this encounter.  Meds ordered this encounter  Medications  . liraglutide (VICTOZA) 18 MG/3ML SOPN    Sig: Initial: 0.6 mg once daily for 1 week; then increase to 1.2 mg once daily.    Dispense:  3 pen    Refill:  11  . sertraline (ZOLOFT) 50 MG tablet    Sig: TAKE 1 TAB (50 MG) DAILY FOR 1 WEEK, THEN INCREASE TO 2 TAB (100 MG) DAILY.    Dispense:  90 tablet    Refill:  0    Harriet Butte, DO [place]  [facility], PGY-2 09/08/2017, 7:12 PM

## 2017-09-07 NOTE — Patient Instructions (Signed)
Thank you for coming in to see [place] today. Please see below to review our plan for today's visit.  1.  Continue seeing behavioral therapy.  I will start you on any medication called Zoloft.  He will start with 25 mg daily and increase after 1 week to 50 mg daily.  Onset is variable and can be as early as 1 week but the medication takes approximately 6-8 weeks for its full effect.  It is important that you continue doing things that you love and being with people that you like.  2.  I have discussed restarting Victoza with Dr. Valentina Lucks.  We will start with 0.6 mg daily for 1 week and increase to 1.2 mg daily.  Over time this will help control your diabetes better and has been shown to decrease weight along with cardiovascular disease.  Please call the clinic at [phone] if your symptoms worsen or you have any concerns. It was our pleasure to serve you.  Harriet Butte, [place], PGY-2

## 2017-09-07 NOTE — Assessment & Plan Note (Addendum)
Chronic.  Uncontrolled.  Seems to have predominantly anxiety with mixed depression.  Uncertain what the trigger is.  Has been seen by [facility].  Patient seems to be adherent to psychotherapy.  Patient interested in pharmacotherapy.  No history of SI/HI.  GAD-7 score 8, not difficult at all and PHQ-9 score 9, not difficult at all.  Seems to be relatively mild though I see medication would be appropriate at this point. - Initiating Zoloft 50 mg daily with increase to 100 mg daily after 1 week - Encourage patient to continue following behavioral therapy - RTC 1 month

## 2017-09-07 NOTE — Assessment & Plan Note (Deleted)
A 

## 2017-09-14 ENCOUNTER — Telehealth: Payer: Self-pay | Admitting: Family Medicine

## 2017-09-14 NOTE — Telephone Encounter (Signed)
Pt said the pharmacy has faxed over a request concerning the pt's insulin pens that the dr needs to approve or sign. Please advise

## 2017-09-21 ENCOUNTER — Ambulatory Visit: Payer: Medicaid Other | Admitting: Psychology

## 2017-09-21 DIAGNOSIS — F32A Depression, unspecified: Secondary | ICD-10-CM

## 2017-09-21 DIAGNOSIS — F419 Anxiety disorder, unspecified: Principal | ICD-10-CM

## 2017-09-21 DIAGNOSIS — F329 Major depressive disorder, single episode, unspecified: Secondary | ICD-10-CM

## 2017-09-21 NOTE — Progress Notes (Signed)
Reason for follow-up:  Craig [name] is following up with [facility] for continued management of depressive and anxiety symptoms.   Issues discussed:  Craig [name] discussed some struggles with limited social and family support. He reported a sense of isolation. He has a social circle through bowling, where he goes 4x/week, but reported feeling  the relationships are not reciprocal with him being the person to reach out most of the time.  Craig [name] reported taking Zoloft for the past week and not missing any doses. He reported feeling drowsy with the medication and going to bed around 4 pm and has thus started taking it in the evening.  Craig [name] also reported some worries mostly centering around financial difficulties and his health.

## 2017-09-21 NOTE — Assessment & Plan Note (Deleted)
Assessment/Plan Recommendations:  Craig [name] continues to struggle with some anxiety symptoms (GAD-7=5) and moderately severe depressive symptoms (PHQ9=16, endorsed item 9 SI) and is especially burdened by feelings of isolation. His affect was appropriate throughout the session, though he often deviated from the topic at hand and I frequently had to refocus him to respond to my questions.   Suicide Assessment  Plan: - How specific is the plan: stab self  - How lethal are the means: can be lethal - Does the patient have access to the means: yes - Does the patient have social support: Limited but can call a friend who listens  Protective factors (what has kept the patient from self-harm thus far):  Thinks about his hobbies, bowling and sowing.  Substance use / abuse:  Denied all currently   Presence of hallucinations / delusions: none History of SI / Attempts: none Duration and Intensity of SI:  Lasts just a moment  History of prior psychiatric hospitalizations: None  Coping mechanisms: Usually distracts self and gets busy and tries to not think about these thoughts.   How likely are you to act on these thoughts of hurting yourself or ending your life sometime over the next month? NOT LIKELY AT ALL SOMEWHAT LIKELY VERY LIKELY   [facility] consulted with Dr. Erin Hearing; hospitalization not indicated.  [facility] discussed with Craig [name] whom he can contact if thoughts return, persist or are exacerbated. He reported he can call a friend. [facility] provided resources and also explained he can call  CFM or go to ED, and Craig [name] reported being willing to do both.   Craig [name]'s depressive symptoms are likely maintained by his limited social support. He does enjoy bowling and sowing and both keep him motivated, but it will be important for him to deepen his friendships so he is able to get his social needs met. Additionally, he would benefit from expanding his social circle beyond bowling.  Craig [name] is taking Zoloft daily in the  evenings to avoid feeling drowsy and reports no further side effects.  Craig [name] will return to see [facility] on 3/1.

## 2017-09-24 ENCOUNTER — Encounter: Payer: Self-pay | Admitting: Pharmacist

## 2017-09-24 ENCOUNTER — Ambulatory Visit: Payer: Medicaid Other | Admitting: Pharmacist

## 2017-09-24 DIAGNOSIS — E1169 Type 2 diabetes mellitus with other specified complication: Secondary | ICD-10-CM | POA: Diagnosis not present

## 2017-09-24 DIAGNOSIS — E1165 Type 2 diabetes mellitus with hyperglycemia: Secondary | ICD-10-CM

## 2017-09-24 DIAGNOSIS — R202 Paresthesia of skin: Secondary | ICD-10-CM

## 2017-09-24 DIAGNOSIS — I10 Essential (primary) hypertension: Secondary | ICD-10-CM | POA: Diagnosis not present

## 2017-09-24 DIAGNOSIS — F411 Generalized anxiety disorder: Secondary | ICD-10-CM

## 2017-09-24 DIAGNOSIS — G5603 Carpal tunnel syndrome, bilateral upper limbs: Secondary | ICD-10-CM | POA: Diagnosis not present

## 2017-09-24 DIAGNOSIS — E785 Hyperlipidemia, unspecified: Secondary | ICD-10-CM | POA: Diagnosis not present

## 2017-09-24 MED ORDER — DULOXETINE HCL 30 MG PO CPEP: 30.0000 mg | ORAL_CAPSULE | Freq: Every day | ORAL | 2 refills | Status: DC

## 2017-09-24 MED ORDER — MELOXICAM 15 MG PO TABS: 15.0000 mg | ORAL_TABLET | Freq: Every day | ORAL | 0 refills | Status: DC

## 2017-09-24 MED ORDER — CARVEDILOL 3.125 MG PO TABS: 3.1250 mg | ORAL_TABLET | Freq: Two times a day (BID) | ORAL | 11 refills | Status: DC

## 2017-09-24 MED ORDER — LOSARTAN POTASSIUM-HCTZ 50-12.5 MG PO TABS: 1.0000 | ORAL_TABLET | Freq: Every day | ORAL | 3 refills | Status: DC

## 2017-09-24 MED ORDER — GABAPENTIN 300 MG PO CAPS: ORAL_CAPSULE | ORAL | 2 refills | Status: DC

## 2017-09-24 MED ORDER — ATORVASTATIN CALCIUM 80 MG PO TABS: 80.0000 mg | ORAL_TABLET | Freq: Every day | ORAL | 3 refills | Status: DC

## 2017-09-24 MED ORDER — PANTOPRAZOLE SODIUM 40 MG PO TBEC: DELAYED_RELEASE_TABLET | ORAL | 3 refills | Status: DC

## 2017-09-24 MED ORDER — INSULIN PEN NEEDLE 31G X 8 MM MISC: 12 refills | Status: DC

## 2017-09-24 NOTE — Progress Notes (Signed)
Patient ID: Craig [name], male   DOB: Jul 14, 1961, 57 y.o.   MRN: [id number] Reviewed: agree with Dr. Graylin Shiver documentation and management.

## 2017-09-24 NOTE — Assessment & Plan Note (Signed)
Diabetes longstanding/newly diagnosed currently well controlled as evidenced by CBGs and A1C. Patient denies hypoglycemic events and is able to verbalize appropriate hypoglycemia management plan. Patient reports adherence with medication. Blood glucose control is optimal, but optimization of antidiabetic agents to promote weight loss will be further considered. Continue Metformin 1000 mg BID. Start Victoza 0.6 mg daily for one week, with an increase in dose to 1.2 mg daily the second week, and then to 1.8 mg daily the third week. Next A1C anticipated March 2019. Will increase Gabapentin from 300 mg TID to 300 mg once in the morning and once at lunchtime, then 600 mg at bedtime. Sent refill Rx for gabapentin to pharmacy.

## 2017-09-24 NOTE — Progress Notes (Signed)
    S:     No chief complaint on file.  Chief Complaint  Patient presents with  . Medication Management    diabetes    Patient arrives in good spirits and ambulating without assistance.  Presents for diabetes evaluation, education, and management after visit with PCP Dr. Yisroel Ramming. Patient is well known to pharmacy clinic.  Patient reports he is doing well, reports well controlled CBGs, denies hypoglycemia, and reports adherence to medications. Patients states he has continued to bowl three times weekly and is now swimming (water aerobics) once twice weekly. Patient denies hypoglycemic events.   Patient reports adherence with medications.  Current diabetes medications include: metformin 1000 mg BID. Was not taking the for about a week or so since he ran out of needed tips. Current hypertension medications include: losartan-HCTZ 50-12.5 mg daily, carvedilol 3.125 mg BID   Patient denies hypoglycemic events.  Patient endorses both neuropathic pain and joint pain from arthritis. Currently taking gabapentin 300 mg TID and Ibuprofen 800 mg once daily.   Patient taking sertraline 50 mg for anxiety and depression. He complains of excess sedation on the current regimen.    O:  Physical Exam  Constitutional: He appears well-developed and well-nourished.    ROS  All other systems reviewed and are negative.   Lab Results  Component Value Date   HGBA1C 5.9 07/13/2017   There were no vitals filed for this visit.  Home fasting CBG: 90-100 2 hour post-prandial/random CBG: 90s 7-day average 102 14-day average 104 30-day average 105   A/P: Diabetes longstanding/newly diagnosed currently well controlled as evidenced by CBGs and A1C. Patient denies hypoglycemic events and is able to verbalize appropriate hypoglycemia management plan. Patient reports adherence with medication. Blood glucose control is optimal, but optimization of antidiabetic agents to promote weight loss will be  further considered. Continue Metformin 1000 mg BID. Start Victoza 0.6 mg daily for one week, with an increase in dose to 1.2 mg daily the second week, and then to 1.8 mg daily the third week. Next A1C anticipated March 2019. Will increase Gabapentin from 300 mg TID to 300 mg once in the morning and once at lunchtime, then 600 mg at bedtime. Sent refill Rx for gabapentin to pharmacy.   Patient's arthritic pain is currently uncontrolled and managed with high-dose ibuprofen TID. Will discontinue ibuprofen and start  Meloxicam 15 mg daily to help minimize drug-drug interactions and help with GI side effects. Send Rx for Meloxicam to pharmacy.   ASCVD risk greater than 7.5%. Continued Aspirin 81 mg and Continued atorvastatin 80 mg. Refilled atorvastatin.   Hypertension longstanding diagnosed currently well controlled.  Patient reports adherence with medication. Continue current medications. Sent refill for carvedilol and Hyzaar.   Anxiety with depressed mood - remains symptomatic despite sertraline 100mg . Patient is unwilling to increase sertraline due to somnolence.   Will titrate off the sertraline and begin duloxetine instead. Patient is to take 50 mg of sertraline tonight with 30 mg of Cymbalta. he has been instructed to continue to take sertraline with the Cymbalta for 1 week, but then stop the sertraline after 1 week and continue Cymbalta. Rx for Cymbalta sent to pharmacy.   Written patient instructions provided.  Total time in face to face counseling 52minutes.   Follow up in Pharmacist Clinic Visit the beginning of April.   Patient seen with Onnie Boer, PharmD Candidate,  Leroy Libman, PharmD. And Dennie Fetters, MD.

## 2017-09-24 NOTE — Patient Instructions (Addendum)
1.) Stop using Ibuprofen. Begin taking Meloxicam 15 mg daily for arthritis pain.    2.) Take 50 mg of sertraline tonight and 30 mg of Cymbalta tonight. Continue to take sertraline with the Cymbalta for 1 week, but then STOP the sertraline after 1 week and continue Cymbalta.   3.) Start taking Gabapentin 300 mg once in the morning and once at lunchtime. Take 600 mg at bedtime.   4.) Start taking Victoza 0.6 mg daily for one week, then increase dose to 1.2 mg daily the second week, and then increase dose to 1.8 mg daily the third week.   5.) Return to pharmacy clinic the beginning of April.

## 2017-09-24 NOTE — Assessment & Plan Note (Addendum)
Patient's arthritic pain is currently uncontrolled and managed with high-dose ibuprofen. Will discontinue ibuprofen and start  Meloxicam 15 mg daily to help minimize drug-drug interactions and help with GI side effects. Send Rx for Meloxicam to pharmacy.

## 2017-09-24 NOTE — Assessment & Plan Note (Signed)
Hypertension longstanding diagnosed currently well controlled.  Patient reports adherence with medication. Continue current medications. Sent refill for carvedilol and Hyzaar.

## 2017-09-24 NOTE — Assessment & Plan Note (Addendum)
Anxiety with depressed mood - remains symptomatic despite sertraline 100mg . Patient is unwilling to increase sertraline due to somnolence.   Will titrate off the sertraline and begin duloxetine instead. Patient is to take 50 mg of sertraline tonight with 30 mg of Cymbalta. he has been instructed to continue to take sertraline with the Cymbalta for 1 week, but then stop the sertraline after 1 week and continue Cymbalta. Rx for Cymbalta sent to pharmacy.

## 2017-09-25 NOTE — Assessment & Plan Note (Signed)
Assessment/Plan Recommendations:  Kostas continues to struggle with some anxiety symptoms (GAD-7=5) and moderately severe depressive symptoms (PHQ9=16, endorsed item 9 SI) and is especially burdened by feelings of isolation. His affect was appropriate throughout the session, though he often deviated from the topic at hand and I frequently had to refocus him to respond to my questions.   Suicide Assessment  Plan: - How specific is the plan: stab self  - How lethal are the means: can be lethal - Does the patient have access to the means: yes - Does the patient have social support: Limited but can call a friend who listens  Protective factors (what has kept the patient from self-harm thus far):  Thinks about his hobbies, bowling and sowing.  Substance use / abuse:  Denied all currently   Presence of hallucinations / delusions: none History of SI / Attempts: none Duration and Intensity of SI:  Lasts just a moment  History of prior psychiatric hospitalizations: None  Coping mechanisms: Usually distracts self and gets busy and tries to not think about these thoughts.   How likely are you to act on these thoughts of hurting yourself or ending your life sometime over the next month? NOT LIKELY AT ALL SOMEWHAT LIKELY VERY LIKELY   [facility] consulted with Dr. Erin Hearing; hospitalization not indicated.  [facility] discussed with [place] whom he can contact if thoughts return, persist or are exacerbated. He reported he can call a friend. [facility] provided resources and also explained he can call  CFM or go to ED, and Giovan reported being willing to do both.   Zeplin's depressive symptoms are likely maintained by his limited social support. He does enjoy bowling and sowing and both keep him motivated, but it will be important for him to deepen his friendships so he is able to get his social needs met. Additionally, he would benefit from expanding his social circle beyond bowling.  Danton is taking Zoloft daily in the  evenings to avoid feeling drowsy and reports no further side effects.  Keyshun will return to see [facility] on 3/1.

## 2017-10-12 ENCOUNTER — Ambulatory Visit: Payer: Medicaid Other

## 2017-11-02 ENCOUNTER — Telehealth: Payer: Self-pay | Admitting: Psychology

## 2017-11-02 NOTE — Telephone Encounter (Signed)
[facility] followed up with Craig [name] after a missed appointment on March 1st. Craig [name] was interested in scheduling a further appointment with [facility] and is scheduled for 04/12 at 11 AM.

## 2017-11-03 ENCOUNTER — Other Ambulatory Visit: Payer: Self-pay | Admitting: Family Medicine

## 2017-11-03 DIAGNOSIS — F411 Generalized anxiety disorder: Secondary | ICD-10-CM

## 2017-11-03 DIAGNOSIS — G5603 Carpal tunnel syndrome, bilateral upper limbs: Secondary | ICD-10-CM

## 2017-11-23 ENCOUNTER — Ambulatory Visit: Payer: Medicaid Other

## 2017-11-30 ENCOUNTER — Ambulatory Visit: Payer: Medicaid Other

## 2017-12-03 ENCOUNTER — Ambulatory Visit: Payer: Medicaid Other | Admitting: Psychology

## 2017-12-03 DIAGNOSIS — F419 Anxiety disorder, unspecified: Principal | ICD-10-CM

## 2017-12-03 DIAGNOSIS — F329 Major depressive disorder, single episode, unspecified: Secondary | ICD-10-CM

## 2017-12-03 NOTE — Progress Notes (Signed)
Reason for follow-up:  Craig [name] returns to discuss his symptoms of anxiety and depression.  Issues discussed:  The patient is doing well. He seems to have benefitted from his time with Verdis Frederickson. 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 He attributes his reduced anxiety and depression symptoms to these activities.

## 2017-12-03 NOTE — Patient Instructions (Addendum)
Craig [name] -- it was good to meet with you today. I'm [place] [facility] [place] and doing [place]ob[place]. Also wonderful that you are bowling and going to church. Keep up the good work. Enjoy your trip to [place]. See you Monday May 10 at 10 am

## 2017-12-03 NOTE — Assessment & Plan Note (Signed)
Assessment/Plan/Recommendations: Craig [name] had very positive affect today and has no SI. His symptoms of anxiety (GAD7 = 2, mild, not difficult at all) and depression (PHQ9 =3, minimal, no SI) are minimal to mild. He has been prescribed Zoloft but is no longer taking it. He has not had problems with stopping this medication. He requested to delay his next visit until after appointments with Drs. Koval and McMullen. Accordingly, he will return on Monday, May 20 at 10 am.

## 2017-12-10 ENCOUNTER — Ambulatory Visit: Payer: Medicaid Other | Admitting: Pharmacist

## 2017-12-10 ENCOUNTER — Encounter: Payer: Self-pay | Admitting: Pharmacist

## 2017-12-10 DIAGNOSIS — E785 Hyperlipidemia, unspecified: Secondary | ICD-10-CM | POA: Diagnosis not present

## 2017-12-10 DIAGNOSIS — E1169 Type 2 diabetes mellitus with other specified complication: Secondary | ICD-10-CM | POA: Diagnosis not present

## 2017-12-10 DIAGNOSIS — I10 Essential (primary) hypertension: Secondary | ICD-10-CM | POA: Diagnosis not present

## 2017-12-10 DIAGNOSIS — E1165 Type 2 diabetes mellitus with hyperglycemia: Secondary | ICD-10-CM

## 2017-12-10 DIAGNOSIS — B353 Tinea pedis: Secondary | ICD-10-CM | POA: Diagnosis not present

## 2017-12-10 LAB — POCT GLYCOSYLATED HEMOGLOBIN (HGB A1C): Hemoglobin A1C: 5.9

## 2017-12-10 MED ORDER — ACCU-CHEK AVIVA PLUS W/DEVICE KIT: 1.0000 | PACK | 0 refills | Status: DC | PRN

## 2017-12-10 MED ORDER — TERBINAFINE HCL 1 % EX CREA: 1.0000 "application " | TOPICAL_CREAM | Freq: Two times a day (BID) | CUTANEOUS | 1 refills | Status: DC

## 2017-12-10 NOTE — Assessment & Plan Note (Signed)
Hypertension longstanding diagnosed currently well controlled.  Patient reports adherence with medication. Continue  losartan/HCTZ 50-12.5 daily, carvedilol 3.125mg  BID.

## 2017-12-10 NOTE — Progress Notes (Addendum)
    S:     Chief Complaint  Patient presents with  . Medication Management    Diabetes, Weight    Patient arrives ambulating on his own and in a pleasant mood.  Presents for diabetes management and is well known to the clinic.  He is a patient of Dr. Yisroel Ramming and was last seen 09/07/17.  He reports bowling more often to prepare for an upcoming tournament, and tolerating Victoza well as long as he injects after he eats breakfast.  He states his mood has been improved with more activity and focus on projects including bowling and designing/making clothing.  He also reports some somnolence with meloxicam and states taking in the evening now.  Patient reports adherence with medications with the exception of duloxetine of which he reports self discontinuation due to little efficacy by his report and progress as stated above. Current diabetes medications include: Metformin 1000mg  BID, Victoza 1.2mg  SubQ daily Current hypertension medications include: losartan/HCTZ 50-12.5 daily, carvedilol 3.125mg  BID  Patient reports rare hypoglycemic events with mild symptoms when CBGs get into the 70s  Patient reported exercise habits: Bowling four times a week (7h/day)   O:  Physical Exam  Constitutional: He appears well-developed and well-nourished.  Vitals reviewed.  Review of Systems  All other systems reviewed and are negative.  Lab Results  Component Value Date   HGBA1C 5.9 12/10/2017   Vitals:   12/10/17 1018  BP: 126/72  Pulse: 72  SpO2: 98%    Home fasting CBG: 110s-130s with a low of 74   A/P: Diabetes longstanding diagnosed currently well controlled as evidence by home CBGs and current A1c. Patient reports hypoglycemic events with mild symptoms as described in subjective and is able to verbalize appropriate hypoglycemia management plan. Patient reports adherence with medication. Weight loss has improved with increased exercise and now on tolerated dose of Victoza. Continue Metformin  1000mg  BID and Victoza 1.2mg  SubQ daily.  A new prescription was sent for a new ACCU-CHEK AVIVA PLUS meter.    Next A1C anticipated August 2019.    Hypertension longstanding diagnosed currently well controlled.  Patient reports adherence with medication. Continue  losartan/HCTZ 50-12.5 daily, carvedilol 3.125mg  BID.  Written patient instructions provided.  Total time in face to face counseling 20 minutes.   Follow up with Dr. Yisroel Ramming in May 2019.   Patient seen with Hildred Alamin, PharmD Candidate and Bertis Ruddy, PharmD, PGY1 Resident.   Lipid Panel and A1c reviewed and appear to be well controlled at this time. No change in therapy at this time.

## 2017-12-10 NOTE — Patient Instructions (Addendum)
It was good to see you today, We sent a refill for your Lamisil cream, and a prescription for a new blood glucose meter No changes to make with your medications at this time, continue the good work with weight loss and exercise! Next visit with Dr. Yisroel Ramming in May, and next Rx visit in June after you get back from your trip

## 2017-12-10 NOTE — Assessment & Plan Note (Signed)
Diabetes longstanding diagnosed currently well controlled as evidence by home CBGs and current A1c. Patient reports hypoglycemic events with mild symptoms as described in subjective and is able to verbalize appropriate hypoglycemia management plan. Patient reports adherence with medication. Weight loss has improved with increased exercise and now on tolerated dose of Victoza. Continue Metformin 1000mg  BID and Victoza 1.2mg  SubQ daily.  A new prescription was sent for a new ACCU-CHEK AVIVA PLUS meter.

## 2017-12-11 LAB — LIPID PANEL
CHOL/HDL RATIO: 3.8 ratio (ref 0.0–5.0)
Cholesterol, Total: 125 mg/dL (ref 100–199)
HDL: 33 mg/dL — AB (ref >39)
LDL CALC: 73 mg/dL (ref 0–99)
Triglycerides: 93 mg/dL (ref 0–149)
VLDL CHOLESTEROL CAL: 19 mg/dL (ref 5–40)

## 2017-12-27 ENCOUNTER — Encounter: Payer: Self-pay | Admitting: Family Medicine

## 2017-12-27 ENCOUNTER — Ambulatory Visit: Payer: Medicaid Other | Admitting: Family Medicine

## 2017-12-27 VITALS — BP 124/70 | HR 70 | Temp 98.5°F | Ht 71.0 in | Wt 376.2 lb

## 2017-12-27 DIAGNOSIS — F419 Anxiety disorder, unspecified: Secondary | ICD-10-CM | POA: Diagnosis not present

## 2017-12-27 DIAGNOSIS — E119 Type 2 diabetes mellitus without complications: Secondary | ICD-10-CM

## 2017-12-27 DIAGNOSIS — Z6841 Body Mass Index (BMI) 40.0 and over, adult: Secondary | ICD-10-CM

## 2017-12-27 DIAGNOSIS — F329 Major depressive disorder, single episode, unspecified: Secondary | ICD-10-CM | POA: Diagnosis not present

## 2017-12-27 NOTE — Progress Notes (Signed)
Subjective   Patient ID: Craig [name]    DOB: Dec 18, 1960, 57 y.o. male   MRN: [id number]  CC: "Diabetes and weight check"  HPI: Craig [name] is a 57 y.o. male who presents to clinic today for the following:  Diabetes: Currently tolerating Victoza 1.2 mg daily for the past month.  Also taking metformin 1000 mg twice daily.  Recently seen by Dr. Valentina Lucks for diabetes follow-up.  A1c 5.9.  CBGs well-controlled with no hypoglycemia.  Patient has not been checking his CBGs since his last visit because his glucometer was not working.  He has a new glucometer which she has yet to pick up from the pharmacy.  Patient denies chest pain, shortness of breath, nausea or vomiting, abdominal pain.  Obesity: Patient continues to exercise regularly through bowling approximately 4 times per week and has also incorporated swimming regularly.  He is currently down 20 pounds since his last visit.  His weight goal is to achieve 365 pounds over the next few months.  Patient has also been cutting out excessive carbohydrates and has been using diabetic protein bars.  Depression: Has been followed by behavioral therapy with [facility] on Mondays.  Has not been seen in a while.  Patient plans on following up later this month.  He was started on Cymbalta but self discontinued the medication because he felt like it was not helping though he only took this for 1 month.  Patient states his symptoms have greatly improved since he became more active.  He denies suicidal or homicidal ideation.  He is hopeful to avoid medications.  ROS: see HPI for pertinent.  [place]: Morbid obesity (BMI 55), history of gastric cancer, NIDDM, carpal tunnel sydrome. Surgical abdominal. Family history DM. Smoking status reviewed. Medications reviewed.  Objective   BP 124/70 (BP Location: Right Arm, Patient Position: Sitting, Cuff Size: Large) Comment (Cuff Size): extra large thigh size  Pulse 70   Temp 98.5 F (36.9 C) (Oral)   Ht 5\' 11"  (1.803 m)    Wt (!) 376 lb 3.2 oz (170.6 kg)   SpO2 97%   BMI 52.47 kg/m  Vitals and nursing note reviewed.  General: obese, pleasant male, well nourished, well developed, NAD with non-toxic appearance HEENT: normocephalic, atraumatic, moist mucous membranes Neck: supple, non-tender without lymphadenopathy Cardiovascular: regular rate and rhythm without murmurs, rubs, or gallops Lungs: clear to auscultation bilaterally with normal work of breathing Abdomen: soft, non-tender, non-distended, normoactive bowel sounds Skin: warm, dry, no rashes or lesions, cap refill < 2 seconds Extremities: warm and well perfused, normal tone, no edema  Assessment & Plan   Controlled type 2 diabetes mellitus without complication, without long-term current use of insulin (HCC) Chronic.  Well-controlled with metformin and Victoza.  Last A1c 5.9.  Clearly motivated to lose weight while incorporating diet and exercise. - Encourage patient to continue current plan with diet and exercise - Continue metformin 1000 mg twice daily and Victoza 1.2 mg daily - Next A1c due 03/11/2018 - RTC in 3 months  Morbid obesity with BMI of 50.0-59.9, adult (HCC) Chronic.  Down approximately 20 pounds since last visit.  Making diet and exercise efforts.  Also taking Victoza which may be contributing. - Continue current lifestyle modifications  Anxiety and depression Chronic.  Patient not interested in pharmacotherapy.  PHQ 9 score 0. - Encourage patient to continue following a behavioral therapy - Reviewed return precautions  No orders of the defined types were placed in this encounter.  No orders of  the defined types were placed in this encounter.   Harriet Butte, [place], PGY-2 12/27/2017, 3:43 PM

## 2017-12-27 NOTE — Assessment & Plan Note (Signed)
Chronic.  Patient not interested in pharmacotherapy.  PHQ 9 score 0. - Encourage patient to continue following a behavioral therapy - Reviewed return precautions

## 2017-12-27 NOTE — Assessment & Plan Note (Signed)
Chronic.  Down approximately 20 pounds since last visit.  Making diet and exercise efforts.  Also taking Victoza which may be contributing. - Continue current lifestyle modifications

## 2017-12-27 NOTE — Patient Instructions (Signed)
Thank you for coming in to see [place] today. Please see below to review our plan for today's visit.  1.  Dr. Valentina Lucks and I are proud of your accomplishments regarding your weight.  Continue exercising regularly.  I believe you will be able to achieve your weight of 365 pounds. 2.  Continue the Victoza at 1.2 mg daily along with the Metformin 1000 mg twice daily.  We will follow-up your A1c in 3 months. 3.  If any point you feel like your depression seems to be worsening, please schedule an appointment to see me.  We can always revisit medications if necessary.  Ask the front desk regarding follow-up with behavioral medicine. 4.  Follow-up with GI after your trip to [place].  We will discuss this at your next follow-up.  Please call the clinic at [phone] if your symptoms worsen or you have any concerns. It was our pleasure to serve you.  Harriet Butte, [place], PGY-2

## 2017-12-27 NOTE — Assessment & Plan Note (Signed)
Chronic.  Well-controlled with metformin and Victoza.  Last A1c 5.9.  Clearly motivated to lose weight while incorporating diet and exercise. - Encourage patient to continue current plan with diet and exercise - Continue metformin 1000 mg twice daily and Victoza 1.2 mg daily - Next A1c due 03/11/2018 - RTC in 3 months

## 2017-12-31 ENCOUNTER — Ambulatory Visit: Payer: Medicaid Other | Admitting: Psychology

## 2017-12-31 DIAGNOSIS — F419 Anxiety disorder, unspecified: Principal | ICD-10-CM

## 2017-12-31 DIAGNOSIS — F329 Major depressive disorder, single episode, unspecified: Secondary | ICD-10-CM

## 2017-12-31 NOTE — Patient Instructions (Signed)
[place] - It was a pleasure to see you today. I'm glad that you are doing well. Please return if needed.  Be well!  -[place]

## 2017-12-31 NOTE — Assessment & Plan Note (Signed)
Assessment/Plan/recommendations: Craig [name] presents today with minimal symptoms of depression (PHQ9 =2, no SI) and a negative screen for anxiety (GAD7 = 0). His affect was very upbeat and positive, which is consistent with his most recent visit. However, a review of his chart reveals past suicidal ideation and symptoms of depression. When asked about the contrast between past visits and his symptoms today, he stated that his mood varies according to "the type of day" that he has. When asked what troubles him the most, he described an argument that he had with a business colleague. This caused him some distress, which was alleviated by faith and prayer. I provided reflective listening and empathy. Abdulkarim will return as needed. I provided him instructions to call the front desk if he needed an appointment with integrated care. He verbalized his understanding.

## 2017-12-31 NOTE — Progress Notes (Signed)
Reason for follow-up:  Craig [name] returns for follow up on his symptoms of anxiety and depression.  Issues discussed:  The patient reported that he is here because he was told to see someone in [place] by Dr. Samara Deist and/or Dr. Jenne Campus. He also thinks it is necessary to maintain his disability status.

## 2018-01-14 LAB — HM DIABETES EYE EXAM

## 2018-02-18 ENCOUNTER — Encounter: Payer: Self-pay | Admitting: Family Medicine

## 2018-03-05 ENCOUNTER — Other Ambulatory Visit: Payer: Self-pay | Admitting: Family Medicine

## 2018-03-05 DIAGNOSIS — G5603 Carpal tunnel syndrome, bilateral upper limbs: Secondary | ICD-10-CM

## 2018-03-05 DIAGNOSIS — R202 Paresthesia of skin: Secondary | ICD-10-CM

## 2018-03-21 ENCOUNTER — Ambulatory Visit: Payer: Medicaid Other | Admitting: Pharmacist

## 2018-03-25 ENCOUNTER — Other Ambulatory Visit: Payer: Self-pay | Admitting: Family Medicine

## 2018-03-25 DIAGNOSIS — I10 Essential (primary) hypertension: Secondary | ICD-10-CM

## 2018-03-26 ENCOUNTER — Ambulatory Visit: Payer: Medicaid Other | Admitting: Family Medicine

## 2018-03-26 ENCOUNTER — Encounter: Payer: Self-pay | Admitting: Family Medicine

## 2018-03-26 VITALS — BP 130/70 | HR 73 | Temp 98.5°F | Wt 377.2 lb

## 2018-03-26 DIAGNOSIS — Z85028 Personal history of other malignant neoplasm of stomach: Secondary | ICD-10-CM | POA: Diagnosis not present

## 2018-03-26 DIAGNOSIS — E119 Type 2 diabetes mellitus without complications: Secondary | ICD-10-CM

## 2018-03-26 DIAGNOSIS — I10 Essential (primary) hypertension: Secondary | ICD-10-CM

## 2018-03-26 DIAGNOSIS — Z23 Encounter for immunization: Secondary | ICD-10-CM

## 2018-03-26 DIAGNOSIS — Z6841 Body Mass Index (BMI) 40.0 and over, adult: Secondary | ICD-10-CM | POA: Diagnosis not present

## 2018-03-26 LAB — POCT GLYCOSYLATED HEMOGLOBIN (HGB A1C): HbA1c, POC (controlled diabetic range): 5.7 % (ref 0.0–7.0)

## 2018-03-26 MED ORDER — ASPIRIN 81 MG PO TBEC: DELAYED_RELEASE_TABLET | ORAL | 3 refills | Status: DC

## 2018-03-26 NOTE — Progress Notes (Signed)
Subjective   Patient ID: Craig [name]    DOB: [birth year], 57 y.o. male   MRN: [id number]  CC: "Diabetes check"  HPI: Craig [name] is a 57 y.o. male who presents to clinic today for the following:  Diabetes: Patient has long-standing diabetes well controlled with use of Victoza and metformin is here today for follow-up visit.  He has brought a log regarding his lifestyle modifications which are primarily exercise via bowling.  As for diet, patient has "ups and downs."  He is interested in seeing a dietitian regarding his diabetes.  He has not received his pneumococcal vaccine.  Obesity: Patient currently weighs 377 pounds today which is down from 389 pounds earlier in the year.  His primary focus is exercise through bowling but he has made little achievement regarding diet restriction.  He is hopeful to achieve less than 300 pounds by the end of the year.  History of gastric cancer: Patient reports seeing a GI office in [place] approximately 2 weeks ago for a screening colonoscopy.  He reports reassuring findings was instructed to "follow-up in 5 years."  He currently takes a baby aspirin daily denies hematuria, melena or hematochezia, constipation or diarrhea.  Hypertension: Patient tolerating long-term use of Coreg and Hyzaar.  He is a non-smoker.  His kidney function has remained preserved as of last year and he is up-to-date on his ophthalmology exams.  ROS: see HPI for pertinent.  [place]: Morbid obesity (BMI 55), history of gastric cancer, NIDDM, carpal tunnel sydrome. Surgical abdominal. Family history DM. Smoking status reviewed. Medications reviewed.  Objective   BP 130/70 (BP Location: Left Arm, Patient Position: Sitting, Cuff Size: Large)   Pulse 73   Temp 98.5 F (36.9 C) (Oral)   Wt (!) 377 lb 3.2 oz (171.1 kg)   SpO2 97%   BMI 52.61 kg/m  Vitals and nursing note reviewed.  General: well nourished, well developed, NAD with non-toxic appearance HEENT: normocephalic,  atraumatic, moist mucous membranes Cardiovascular: regular rate and rhythm without murmurs, rubs, or gallops Lungs: clear to auscultation bilaterally with normal work of breathing Abdomen: obese, soft, non-tender, non-distended, normoactive bowel sounds Skin: warm, dry, no rashes or lesions, cap refill < 2 seconds Extremities: warm and well perfused, normal tone, chronic mild pitting edema on lower extremities with hyperpigmentation at distal portions of lower shins  Assessment & Plan   Controlled type 2 diabetes mellitus without complication, without long-term current use of insulin (HCC) Chronic.  Well-controlled, A1c 5.7.  Patient's primary problem is his morbid obesity. - Discussed ways to reduce caloric intake and encourage patient to continue exercising through bowling - Continue metformin 1000 mg twice daily and Victoza 1.2 mg daily - Administered pneumococcal vaccination - RTC 3 months, next A1c due 06/26/2018  History of gastric cancer Uncertain history.  Patient has persistently made this more difficult by being ambiguous but does report seeing a physician in [place] and subsequently received treatment with laser surgery.  He did undergo colonoscopy as recommended by me 2 weeks ago in [place] and was told "everything looks good and to return in 5 years."  Patient does report there were no findings.  I do find this odd being that this would be an indication for follow-up in 10 years though is uncertain history of stomach cancer makes this even more complicated.  Remains asymptomatic. - We will reach out to receive documents regarding colonoscopy, patient to follow-up with stomach cancer documents after he visits [place]  Primary hypertension Chronic.  Controlled.  Non-smoker.  Is on high intensity statin and daily aspirin. - Continue Hyzaar 50-12.5 mg daily and Coreg 12.5 mg twice daily - Given refill for aspirin 81 mg daily  Morbid obesity with BMI of 50.0-59.9, adult  (HCC) Chronic.  Has made progress with 21 pound weight loss since November 2018.  Primary issue is oral intake. - Ambulatory referral to nutrition and diabetes services - Encourage patient to restrict intake via portion control  Orders Placed This Encounter  Procedures  . Pneumococcal polysaccharide vaccine 23-valent greater than or equal to 2yo subcutaneous/IM  . Referral to Nutrition and Diabetes Services    Referral Priority:   Routine    Referral Type:   Consultation    Referral Reason:   Specialty Services Required    Number of Visits Requested:   1  . HgB A1c   Meds ordered this encounter  Medications  . aspirin 81 MG EC tablet    Sig: TAKE 1 TABLET BY MOUTH EVERY DAY    Dispense:  90 tablet    Refill:  [place], DO [place], PGY-3 03/26/2018, 6:02 PM

## 2018-03-26 NOTE — Patient Instructions (Signed)
Thank you for coming in to see [place] today. Please see below to review our plan for today's visit.  1.  We will reach out to get the records regarding her colonoscopy.  When you go to [facility], please pick up the records concerning her gastric cancer and bring them to your next visit. 2.  Your diabetes is well controlled.  Congratulations!  See if you can lose 77 pounds prior to your next visit in 3 months.  In the meantime, I will refer you to diabetic nutrition counseling.  Please call the clinic at [phone] if your symptoms worsen or you have any concerns. It was our pleasure to serve you.  Harriet Butte, [place], PGY-3

## 2018-03-26 NOTE — Assessment & Plan Note (Signed)
Uncertain history.  Patient has persistently made this more difficult by being ambiguous but does report seeing a physician in [place] and subsequently received treatment with laser surgery.  He did undergo colonoscopy as recommended by me 2 weeks ago in [place] and was told "everything looks good and to return in 5 years."  Patient does report there were no findings.  I do find this odd being that this would be an indication for follow-up in 10 years though is uncertain history of stomach cancer makes this even more complicated.  Remains asymptomatic. - We will reach out to receive documents regarding colonoscopy, patient to follow-up with stomach cancer documents after he visits [facility]

## 2018-03-26 NOTE — Assessment & Plan Note (Signed)
Chronic.  Controlled.  Non-smoker.  Is on high intensity statin and daily aspirin. - Continue Hyzaar 50-12.5 mg daily and Coreg 12.5 mg twice daily - Given refill for aspirin 81 mg daily

## 2018-03-26 NOTE — Assessment & Plan Note (Addendum)
Chronic.  Has made progress with 21 pound weight loss since November 2018.  Primary issue is oral intake. - Ambulatory referral to nutrition and diabetes services - Encourage patient to restrict intake via portion control

## 2018-03-26 NOTE — Assessment & Plan Note (Addendum)
Chronic.  Well-controlled, A1c 5.7.  Patient's primary problem is his morbid obesity. - Discussed ways to reduce caloric intake and encourage patient to continue exercising through bowling - Continue metformin 1000 mg twice daily and Victoza 1.2 mg daily - Administered pneumococcal vaccination - RTC 3 months, next A1c due 06/26/2018

## 2018-04-04 ENCOUNTER — Telehealth: Payer: Self-pay | Admitting: Pharmacist

## 2018-04-04 NOTE — Telephone Encounter (Signed)
Phone Call F/U of missed visit.  Patient reports doing well.  We discussed his bowling scores and he reports he is doing well with activity.  Self-reports weight is now 368#.    This weight is down from 377# at last visit.  Encouraged to continue with activity.  He plans to reschedule in [facility] in 4-6 weeks for DM follow-up.

## 2018-04-05 NOTE — Telephone Encounter (Signed)
Noted. Pt was doing well the other day. Thanks.  Harriet Butte, [place], PGY-3

## 2018-04-11 ENCOUNTER — Other Ambulatory Visit: Payer: Self-pay | Admitting: Family Medicine

## 2018-04-11 NOTE — Telephone Encounter (Signed)
Will forward to PCP  Bacigalupo, Dionne Bucy, MD, MPH [facility] 04/11/2018 10:31 AM

## 2018-04-19 ENCOUNTER — Ambulatory Visit: Payer: Medicaid Other | Admitting: Registered"

## 2018-05-01 ENCOUNTER — Ambulatory Visit: Payer: Medicaid Other | Admitting: Registered"

## 2018-06-17 ENCOUNTER — Other Ambulatory Visit: Payer: Self-pay | Admitting: Family Medicine

## 2018-06-17 ENCOUNTER — Ambulatory Visit: Payer: Medicaid Other | Admitting: Pharmacist

## 2018-06-24 ENCOUNTER — Ambulatory Visit: Payer: Medicaid Other | Admitting: Family Medicine

## 2018-06-24 VITALS — BP 112/80 | HR 66 | Temp 98.0°F | Wt 385.6 lb

## 2018-06-24 DIAGNOSIS — Z23 Encounter for immunization: Secondary | ICD-10-CM | POA: Diagnosis not present

## 2018-06-24 DIAGNOSIS — E119 Type 2 diabetes mellitus without complications: Secondary | ICD-10-CM | POA: Diagnosis not present

## 2018-06-24 DIAGNOSIS — Z6841 Body Mass Index (BMI) 40.0 and over, adult: Secondary | ICD-10-CM | POA: Diagnosis not present

## 2018-06-24 NOTE — Patient Instructions (Signed)
Thank you for coming in to see [place] today. Please see below to review our plan for today's visit.  Please call the clinic at [phone] if your symptoms worsen or you have any concerns. It was our pleasure to serve you.  Harriet Butte, [place], PGY-3

## 2018-06-24 NOTE — Progress Notes (Signed)
   Subjective   Patient ID: Craig [name]    DOB: 02-26-1961, 57 y.o. male   MRN: [id number]  CC: "Diabetes"  HPI: Craig [name] is a 57 y.o. male who presents to clinic today for the following:  Diabetes: Patient has a long-standing history of non-insulin-dependent diabetes.  He is here today for his routine 53-month checkup.  His last A1c was 5.7 back in August.  He is currently on Victoza and reports fasting blood sugars in the 100-110s.  This is consistent from his prior levels.  Patient states he continues to diet and exercise but is concerned because his weight is up 8 pounds from 3 months ago.  He denies lower extremity edema, shortness of breath or chest pain, constipation.  ROS: see HPI for pertinent.  [place]: Morbid obesity (BMI 55), history of gastric cancer, NIDDM, carpal tunnel sydrome. Surgical abdominal. Family history DM. Smoking status reviewed. Medications reviewed.  Objective   BP 112/80 (BP Location: Right Arm, Patient Position: Sitting, Cuff Size: Large)   Pulse 66   Temp 98 F (36.7 C) (Oral)   Wt (!) 385 lb 9.6 oz (174.9 kg)   SpO2 97%   BMI 53.78 kg/m  Vitals and nursing note reviewed.  General: obese, well nourished, well developed, NAD with non-toxic appearance HEENT: normocephalic, atraumatic, moist mucous membranes Cardiovascular: regular rate and rhythm without murmurs, rubs, or gallops Lungs: clear to auscultation bilaterally with normal work of breathing Abdomen: soft, non-tender, non-distended, normoactive bowel sounds Skin: warm, dry, no rashes or lesions, cap refill < 2 seconds Extremities: warm and well perfused, normal tone, no edema  Assessment & Plan   Controlled type 2 diabetes mellitus without complication, without long-term current use of insulin (HCC) Chronic.  Well-controlled.  Not yet due for A1c, 3 days early.  Patient has schedule appointment later this month.  Expect good control based on fasting glucose levels.  Patient does seem  interested in discussing options for bariatric surgery despite significant weight loss over the last year.  I do find this reasonable given his comorbid conditions and significant effort to lose weight. - Continue Victoza 1.8 mg daily - Ambulatory referral to bariatric surgery - Flu shot administered  Orders Placed This Encounter  Procedures  . Flu Vaccine QUAD 36+ mos IM  . Amb Referral to Bariatric Surgery    Referral Priority:   Routine    Referral Type:   Consultation    Number of Visits Requested:   1   No orders of the defined types were placed in this encounter.   Harriet Butte, [place], PGY-3 06/26/2018, 7:53 AM

## 2018-06-26 ENCOUNTER — Encounter: Payer: Self-pay | Admitting: Family Medicine

## 2018-06-26 NOTE — Assessment & Plan Note (Signed)
Chronic.  Well-controlled.  Not yet due for A1c, 3 days early.  Patient has schedule appointment later this month.  Expect good control based on fasting glucose levels.  Patient does seem interested in discussing options for bariatric surgery despite significant weight loss over the last year.  I do find this reasonable given his comorbid conditions and significant effort to lose weight. - Continue Victoza 1.8 mg daily - Ambulatory referral to bariatric surgery - Flu shot administered

## 2018-07-04 ENCOUNTER — Ambulatory Visit: Payer: Medicaid Other | Admitting: Pharmacist

## 2018-07-18 ENCOUNTER — Telehealth: Payer: Self-pay | Admitting: Family Medicine

## 2018-07-18 NOTE — Telephone Encounter (Signed)
Pt would like for [place] to give him a call to discuss his dry mouth. He wants to know what he can do for it. Please advise

## 2018-07-22 NOTE — Telephone Encounter (Signed)
Would also advise him to be sure his glucose levels are not uncontrolled. High glucose levels can cause dry mouth.  Harriet Butte, [place], PGY-3

## 2018-07-22 NOTE — Telephone Encounter (Signed)
Called patient and left detailed voice message concerning his dry mouth. Patient was instructed to call and make and appointment if he is still experiencing dry mouth post not drinking caffeine, alcohol and making sure his glucose levels are not out of range per Dr. Yisroel Ramming.   Craig [name], Craig [name]

## 2018-07-22 NOTE — Telephone Encounter (Signed)
Patient can increase his fluid intake with non-caffine beverages. Avoid alcohol which may cause dehydration and avoid anticholingeric like allergy medications such as Benadryl. Please advise. If persists, then may need appointment if very problematic.  Harriet Butte, [place], PGY-3

## 2018-07-29 ENCOUNTER — Ambulatory Visit: Payer: Medicaid Other

## 2018-07-29 NOTE — Progress Notes (Addendum)
Subjective:   Patient ID: Craig [name]    DOB: Mar 21, 1961, 57 y.o. male   MRN: [id number]  Craig [name] is a 57 y.o. male with a history of HTN, GERD, h/o gastric cancer, T2DMHLD, morbid obesity, carpal tunnel, anemmia, anxiety/depression here for   Dry mouth Patient presents for sensation of dry mouth for the past couple of weeks.  On further questioning sensation of dry mouth described as perioral numbness and tingling in lips and front of gums.  States the last time this happened he was in high school and was diagnosed with diabetes at that time.  He has been drinking lots of water and has tried Biotene without much relief.  Eating crushed ice helps.  CBGs have been within normal range for him, this a.m. was 97.  Takes metformin and Victoza 1.8 mg without issues or missed doses.  Notes he has been more thirsty recently and urinating more frequently but denies dysuria.  He is a former smoker, quit 30 years ago.  Denies any history of head or neck radiation.  Denies any difficulty swallowing or chewing food or drink.  Has been working to lose weight through diet and exercise, mainly with portion control and bowling.  Does report some loss of appetite for the past week.  Denies diarrhea, constipation, changes in bowel or bladder habits.  Reports he had a colonoscopy this year in [place] with normal results.  States it has been sometime since he has seen a dentist and notes that he is supposed to be getting surgery to fix 1 of his broken teeth but has been delayed due to insurance issues.  Review of Systems:  Per HPI.  [place], medications and smoking status reviewed.  Objective:   BP 110/74   Pulse 63   Temp 98.3 F (36.8 C) (Oral)   Ht 5\' 11"  (1.803 m)   Wt (!) 373 lb 4 oz (169.3 kg)   SpO2 95%   BMI 52.06 kg/m  Vitals and nursing note reviewed.  General: Obese male, in no acute distress with non-toxic appearance HEENT: normocephalic, atraumatic, moist mucous membranes.  Poor dentition,  no evidence of active infection. Neck: supple, non-tender without lymphadenopathy.  No enlargement of salivary glands noted. CV: regular rate and rhythm without murmurs, rubs, or gallops Lungs: clear to auscultation bilaterally with normal work of breathing Skin: warm, dry, no rashes or lesions Extremities: warm and well perfused, normal tone MSK: ROM grossly intact, strength intact, gait normal Neuro: Alert and oriented, speech normal  Assessment & Plan:   Perioral numbness 2-week history of perioral numbness and tingling with unclear etiology.  Has history of well-controlled diabetes, A1c obtained today 5.7, negative UA.  Given good control, advised decrease in Victoza dose to 1.2 mg as hypoglycemia could contribute to symptoms.  No other offending agents identified in medication list.  We will also obtain urine microalbumin and BMP to check electrolytes and kidney function.  Loss of appetite may be related to gastroparesis, although doubt given history of good control and Victoza could be contributing to this.  Noted to have 12 pound intentional weight loss since last visit, congratulated patient on his efforts.  Noted poor dentition on exam with history of requiring oral surgery, recommended following up with dentist soon.  Advised continuing Biotene and frequent oral hydration for symptomatic relief.  Follow-up with PCP in 1 month.  Orders Placed This Encounter  Procedures  . Microalbumin/Creatinine Ratio, Urine  . Basic Metabolic Panel  . HgB A1c  .  POCT urinalysis dipstick   No orders of the defined types were placed in this encounter.   Rory Percy, DO PGY-2, [facility] 07/30/2018 9:53 PM

## 2018-07-30 ENCOUNTER — Ambulatory Visit: Payer: Medicaid Other | Admitting: Family Medicine

## 2018-07-30 ENCOUNTER — Other Ambulatory Visit: Payer: Self-pay

## 2018-07-30 VITALS — BP 110/74 | HR 63 | Temp 98.3°F | Ht 71.0 in | Wt 373.2 lb

## 2018-07-30 DIAGNOSIS — R200 Anesthesia of skin: Secondary | ICD-10-CM | POA: Insufficient documentation

## 2018-07-30 DIAGNOSIS — E119 Type 2 diabetes mellitus without complications: Secondary | ICD-10-CM

## 2018-07-30 HISTORY — DX: Anesthesia of skin: R20.0

## 2018-07-30 LAB — POCT URINALYSIS DIP (MANUAL ENTRY)
BILIRUBIN UA: NEGATIVE mg/dL
Bilirubin, UA: NEGATIVE
Glucose, UA: NEGATIVE mg/dL
Leukocytes, UA: NEGATIVE
Nitrite, UA: NEGATIVE
Protein Ur, POC: NEGATIVE mg/dL
RBC UA: NEGATIVE
SPEC GRAV UA: 1.025 (ref 1.010–1.025)
UROBILINOGEN UA: 1 U/dL
pH, UA: 6 (ref 5.0–8.0)

## 2018-07-30 LAB — POCT GLYCOSYLATED HEMOGLOBIN (HGB A1C): HbA1c, POC (controlled diabetic range): 5.7 % (ref 0.0–7.0)

## 2018-07-30 NOTE — Patient Instructions (Addendum)
It was great to see you!  Our plans for today:  - continue using biotene for lubrication - follow up with your dentist. - We are checking some labs today, we will call you or send you a letter if they are abnormal.  - Decrease your Victoza to 1.2 mg, follow up with your primary doctor in one month for diabetes. - Bring your colonoscopy results to our clinic.  Take care and seek immediate care sooner if you develop any concerns.   Dr. [facility]

## 2018-07-30 NOTE — Assessment & Plan Note (Addendum)
2-week history of perioral numbness and tingling with unclear etiology.  Has history of well-controlled diabetes, A1c obtained today 5.7, negative UA.  Given good control, advised decrease in Victoza dose to 1.2 mg as hypoglycemia could contribute to symptoms.  No other offending agents identified in medication list.  We will also obtain urine microalbumin and BMP to check electrolytes and kidney function.  Loss of appetite may be related to gastroparesis, although doubt given history of good control and Victoza could be contributing to this.  Noted to have 12 pound intentional weight loss since last visit, congratulated patient on his efforts.  Noted poor dentition on exam with history of requiring oral surgery, recommended following up with dentist soon.  Advised continuing Biotene and frequent oral hydration for symptomatic relief.  Follow-up with PCP in 1 month.

## 2018-07-31 ENCOUNTER — Telehealth: Payer: Self-pay

## 2018-07-31 LAB — BASIC METABOLIC PANEL
BUN / CREAT RATIO: 11 (ref 9–20)
BUN: 13 mg/dL (ref 6–24)
CHLORIDE: 101 mmol/L (ref 96–106)
CO2: 24 mmol/L (ref 20–29)
Calcium: 9.4 mg/dL (ref 8.7–10.2)
Creatinine, Ser: 1.14 mg/dL (ref 0.76–1.27)
GFR calc Af Amer: 83 mL/min/{1.73_m2} (ref >59)
GFR calc non Af Amer: 72 mL/min/{1.73_m2} (ref >59)
GLUCOSE: 88 mg/dL (ref 65–99)
POTASSIUM: 4.4 mmol/L (ref 3.5–5.2)
SODIUM: 142 mmol/L (ref 134–144)

## 2018-07-31 LAB — MICROALBUMIN / CREATININE URINE RATIO
CREATININE, UR: 270.2 mg/dL
MICROALB/CREAT RATIO: 3.6 mg/g{creat} (ref 0.0–30.0)
Microalbumin, Urine: 9.8 ug/mL

## 2018-07-31 NOTE — Telephone Encounter (Signed)
Patient left message he is returning a call. No note found.  Call back is [phone]  Danley Danker, RN [facility])

## 2018-07-31 NOTE — Addendum Note (Signed)
Addended by: Owens Shark, CARINA on: 07/31/2018 07:55 AM   Modules accepted: Level of Service

## 2018-07-31 NOTE — Telephone Encounter (Signed)
Notes recorded by Rory Percy, DO on 07/31/2018 at 2:12 PM EST Called patient about lab results. Labs mostly normal, no clear answer for his perioral numbness/tingling. His Creatinine is slightly higher than previous. Would like to know if he is taking any NSAIDs or other OTC meds. If he is, he should discontinue those and follow up with his PCP in 2-4 weeks for follow up for diabetes as well as repeat labs to ensure his Cr is trending back down.

## 2018-07-31 NOTE — Progress Notes (Signed)
Awesome! I was debating it but wasn't sure if it counted or not. Thanks!

## 2018-08-01 ENCOUNTER — Ambulatory Visit: Payer: Medicaid Other | Admitting: Family Medicine

## 2018-08-01 NOTE — Telephone Encounter (Signed)
Pt informed.  He is taking 800mg  ibuprofen occasionally and takes aleve 480mg  (2 pills) every other day he states that he never takes them at the same time.   Asked him to cut back on the aleve as much as possible. He has purchased some more biotene so feel that he will be able to cut back on the aleve.   Appt made with Dr. Ky Barban Yisroel Ramming not available until Feb) for 08/27/18. [place], Salome Spotted, CMA

## 2018-08-06 ENCOUNTER — Other Ambulatory Visit: Payer: Self-pay | Admitting: Family Medicine

## 2018-08-06 DIAGNOSIS — R202 Paresthesia of skin: Secondary | ICD-10-CM

## 2018-08-06 DIAGNOSIS — G5603 Carpal tunnel syndrome, bilateral upper limbs: Secondary | ICD-10-CM

## 2018-08-08 ENCOUNTER — Telehealth: Payer: Self-pay | Admitting: Family Medicine

## 2018-08-08 NOTE — Telephone Encounter (Signed)
Pt is calling to check on the status of his forms being filled out for SCAT. Pt would like for someone to call him when it is ready to be picked up.

## 2018-08-26 NOTE — Progress Notes (Signed)
Subjective:   Patient ID: Craig [name]    DOB: [birth year], 58 y.o. male   MRN: [id number]  Craig [name] is a 58 y.o. male with a history of HTN, H/o gastric cancer, GERD, DM2, HLD, b/l carpal tunnell, morbid obesity, anxiety/depression here for   Diabetes, Type 2 - Last A1c 5.7 07/2018 - Medications: metformin 1000mg  BID, Victoza 1.2mg  daily - Compliance: hasn't taken any victoza this week because he has been busy - Checking BG at home: yes, 80-112 - Diet: 2 meals per day. 24 hr recall: Lunch (fish, steamed broccoli, cabbage, water). Dinner (sweet potato, water). No snacks.  - Exercise: bowling, swimming 2x per week, just started biking class - Eye exam: UTD - Foot exam: due - Microalbumin: UTD - Denies symptoms of hypoglycemia, polyuria, polydipsia, numbness extremities, foot ulcers/trauma  Perioral numbness - seen previously 07/30/18 with negative U/A, electrolytes. - reports getting better - still taking biotene - hasn't seen dentist yet  Elevated Cr - had been taking lots of aleve for knee pain, discontinued once told of elevated Cr. - denies BC powder, goody powder - reports frequent hydration  Hypertension: - Medications: losartan-HCTZ 50-12.5mg , carvedilol 3.125mg  BID - Checking BP at home: no - Denies any SOB, CP, vision changes, medication SEs, or symptoms of hypotension - Diet: see above - Exercise: see above  Review of Systems:  Per HPI.  [place], medications and smoking status reviewed.  Objective:   BP 112/64   Pulse (!) 58   Temp 97.8 F (36.6 C) (Oral)   Wt (!) 380 lb (172.4 kg)   SpO2 98%   BMI 53.00 kg/m  Vitals and nursing note reviewed.  General: morbidly obese male, in no acute distress with non-toxic appearance CV: regular rate and rhythm without murmurs, rubs, or gallops, no lower extremity edema Lungs: clear to auscultation bilaterally with normal work of breathing Skin: warm, dry, no rashes or lesions Extremities: warm and well perfused,  normal tone. Great toes of bilateral feet with 2nd and 5th toe of R foot with thickened and discolored nails.  MSK: ROM grossly intact, strength intact, gait normal Neuro: Alert and oriented, speech normal  Assessment & Plan:   Perioral numbness Reports improvement with conservative management. No abnormal labs to suggest electrolyte abnormality. Will continue to monitor, advised continued prn biotene.  Morbid obesity with BMI of 50.0-59.9, adult ([place]) Some weight gain since last visit. Again reviewed diet and exercise modifications and congratulated patient on efforts to increase activity level.  Controlled type 2 diabetes mellitus without complication, without long-term current use of insulin ([place]) Doing well with decreased Victoza dose without reported hypoglycemia. Encouraged continued efforts at weight loss through diet and exercise. Foot exam complete today with intact sensation though with onychomycosis. Continue current regimen and f/u in 2 months for repeat A1c.   Onychomycosis Noted on exam to bilateral great toes with 2nd and 5th toe of R foot. Has been using terbinafine cream for about 1 year with minimal improvement. Will prescribe oral terbinafine x12 weeks. Will obtain CMP today.  Primary hypertension BP low normal today with lower heart rate, last few Bps documented within normal range. Discussed lowering dose of carvedilol however patient very resistant. Without symptoms of hypotension, reasonable to continue current regimen and revisit at later appointment if continues.   Elevated Cr  Likely in the setting of much NSAID use for knee pain. Is on losartan-HCTZ for some time but no dose changes recently. Will obtain repeat Cr today given he  has discontinued previous NSAID use.  Orders Placed This Encounter  Procedures  . Comprehensive metabolic panel    Order Specific Question:   Has the patient fasted?    Answer:   No   Meds ordered this encounter  Medications  .  terbinafine (LAMISIL) 250 MG tablet    Sig: Take 1 tablet (250 mg total) by mouth daily.    Dispense:  84 tablet    Refill:  0   Rory Percy, DO PGY-2, [facility] 08/27/2018 7:42 PM

## 2018-08-27 ENCOUNTER — Other Ambulatory Visit: Payer: Self-pay

## 2018-08-27 ENCOUNTER — Ambulatory Visit: Payer: Medicaid Other | Admitting: Family Medicine

## 2018-08-27 ENCOUNTER — Encounter: Payer: Self-pay | Admitting: Family Medicine

## 2018-08-27 VITALS — BP 112/64 | HR 58 | Temp 97.8°F | Wt 380.0 lb

## 2018-08-27 DIAGNOSIS — B351 Tinea unguium: Secondary | ICD-10-CM

## 2018-08-27 DIAGNOSIS — Z6841 Body Mass Index (BMI) 40.0 and over, adult: Secondary | ICD-10-CM | POA: Diagnosis not present

## 2018-08-27 DIAGNOSIS — E119 Type 2 diabetes mellitus without complications: Secondary | ICD-10-CM

## 2018-08-27 DIAGNOSIS — R200 Anesthesia of skin: Secondary | ICD-10-CM | POA: Diagnosis not present

## 2018-08-27 DIAGNOSIS — I10 Essential (primary) hypertension: Secondary | ICD-10-CM

## 2018-08-27 HISTORY — DX: Tinea unguium: B35.1

## 2018-08-27 MED ORDER — TERBINAFINE HCL 250 MG PO TABS: 250.0000 mg | ORAL_TABLET | Freq: Every day | ORAL | 0 refills | Status: DC

## 2018-08-27 NOTE — Telephone Encounter (Signed)
Pt in to see [place] this afternoon. Pt stated his SCAT forms were returned to him since #2 was left blank. Placed SCAT forms in pcp box to complete #2.

## 2018-08-27 NOTE — Assessment & Plan Note (Signed)
BP low normal today with lower heart rate, last few Bps documented within normal range. Discussed lowering dose of carvedilol however patient very resistant. Without symptoms of hypotension, reasonable to continue current regimen and revisit at later appointment if continues.

## 2018-08-27 NOTE — Assessment & Plan Note (Signed)
Reports improvement with conservative management. No abnormal labs to suggest electrolyte abnormality. Will continue to monitor, advised continued prn biotene.

## 2018-08-27 NOTE — Patient Instructions (Signed)
It was great to see you!  Our plans for today:  - We are checking some labs today, we will call you or send you a letter if they are abnormal.  - continue with victoza 1.2mg  and checking your blood sugars. Follow up with Dr. Yisroel Ramming in 2 months for an A1c check. - We are prescribing an oral antifungal for your toe fungus. This will help more than the cream.  Take care and seek immediate care sooner if you develop any concerns.   Dr. [facility]

## 2018-08-27 NOTE — Assessment & Plan Note (Signed)
Some weight gain since last visit. Again reviewed diet and exercise modifications and congratulated patient on efforts to increase activity level.

## 2018-08-27 NOTE — Assessment & Plan Note (Signed)
Noted on exam to bilateral great toes with 2nd and 5th toe of R foot. Has been using terbinafine cream for about 1 year with minimal improvement. Will prescribe oral terbinafine x12 weeks. Will obtain CMP today.

## 2018-08-27 NOTE — Assessment & Plan Note (Signed)
Doing well with decreased Victoza dose without reported hypoglycemia. Encouraged continued efforts at weight loss through diet and exercise. Foot exam complete today with intact sensation though with onychomycosis. Continue current regimen and f/u in 2 months for repeat A1c.

## 2018-08-28 ENCOUNTER — Encounter: Payer: Self-pay | Admitting: Family Medicine

## 2018-08-28 LAB — COMPREHENSIVE METABOLIC PANEL
ALBUMIN: 3.6 g/dL (ref 3.5–5.5)
ALK PHOS: 114 IU/L (ref 39–117)
ALT: 32 IU/L (ref 0–44)
AST: 28 IU/L (ref 0–40)
Albumin/Globulin Ratio: 1 — ABNORMAL LOW (ref 1.2–2.2)
BUN / CREAT RATIO: 8 — AB (ref 9–20)
BUN: 8 mg/dL (ref 6–24)
Bilirubin Total: 0.3 mg/dL (ref 0.0–1.2)
CALCIUM: 9.1 mg/dL (ref 8.7–10.2)
CO2: 26 mmol/L (ref 20–29)
CREATININE: 1 mg/dL (ref 0.76–1.27)
Chloride: 100 mmol/L (ref 96–106)
GFR calc Af Amer: 98 mL/min/{1.73_m2} (ref >59)
GFR, EST NON AFRICAN AMERICAN: 84 mL/min/{1.73_m2} (ref >59)
GLUCOSE: 78 mg/dL (ref 65–99)
Globulin, Total: 3.7 g/dL (ref 1.5–4.5)
Potassium: 4.3 mmol/L (ref 3.5–5.2)
Sodium: 140 mmol/L (ref 134–144)
TOTAL PROTEIN: 7.3 g/dL (ref 6.0–8.5)

## 2018-08-28 NOTE — Telephone Encounter (Signed)
Called and informed patient that [employer] paperwork has been placed up front for pickup.  Patient says the will come and get it  08/29/2018.  Marland KitchenOzella Almond, CMA

## 2018-08-28 NOTE — Telephone Encounter (Signed)
Letter left up front. Please advise.  Harriet Butte, [place], PGY-3

## 2018-09-12 ENCOUNTER — Ambulatory Visit (INDEPENDENT_AMBULATORY_CARE_PROVIDER_SITE_OTHER): Payer: Medicaid Other | Admitting: Pharmacist

## 2018-09-12 ENCOUNTER — Encounter: Payer: Self-pay | Admitting: Pharmacist

## 2018-09-12 VITALS — BP 104/76 | HR 78 | Ht 69.0 in | Wt 373.2 lb

## 2018-09-12 DIAGNOSIS — E785 Hyperlipidemia, unspecified: Secondary | ICD-10-CM

## 2018-09-12 DIAGNOSIS — E119 Type 2 diabetes mellitus without complications: Secondary | ICD-10-CM | POA: Diagnosis not present

## 2018-09-12 DIAGNOSIS — I10 Essential (primary) hypertension: Secondary | ICD-10-CM | POA: Diagnosis not present

## 2018-09-12 DIAGNOSIS — E1169 Type 2 diabetes mellitus with other specified complication: Secondary | ICD-10-CM | POA: Diagnosis not present

## 2018-09-12 MED ORDER — LOSARTAN POTASSIUM 50 MG PO TABS: 50.0000 mg | ORAL_TABLET | Freq: Every day | ORAL | 3 refills | Status: DC

## 2018-09-12 NOTE — Patient Instructions (Addendum)
It was great to see you today!  Keep up the great work with your blood sugars. Continue metformin and Victoza.   Since you are having the lightheadedness, we are going to change one of your blood pressure medication - stop your losartan-HCTZ and start losartan 50 mg daily by itself.   Schedule follow up with Dr. McMullen/Dr. Ky Barban in about a month.

## 2018-09-12 NOTE — Progress Notes (Signed)
    S:     Chief Complaint  Patient presents with  . Medication Management    Diabetes    Patient arrives in good spirits and ambulates without assistance.  Patient arrives ambulating on his own and in a pleasant mood.  Presents for diabetes management and is well known to the clinic.  He is a patient of Dr. Yisroel Ramming and was last seen by Dr. Ky Barban on 08/27/2018. He was given a prescription for terbinafine for onychomycosis. He asks today if he should anticipate any side effects of therapy.   He reports that he continues to bowl 4x/week and has started swimming 3x/week. He denies any concerns  Insurance coverage/medication affordability: [place] [employer]   Patient reports adherence with medications.  Current diabetes medications include: Metformin 1000 mg BID, Victoza 1.2 mg once daily Current hypertension medications include: carvedilol 3.125 mg BID, losartan-HCTZ 50-12.5 mg daily  Patient reported dietary habits: Eats 3 meals/day Breakfast: Protein shake Lunch: No lunch Dinner: Best boy; this week he has been fasting from 6 am to 6 pm; after 6 pm; has been eating salad, baked/boiled protein Drinks: Water  Patient-reported exercise habits: bowling 4x per week, swimming 3x week   Patient denies nocturia.  Patient denies neuropathy. Patient denies visual changes. Patient denies self foot exams.   O:  Physical Exam Vitals signs reviewed.  Constitutional:      Appearance: Normal appearance. He is obese.  Neurological:     Mental Status: He is alert.    Review of Systems  All other systems reviewed and are negative.    Lab Results  Component Value Date   HGBA1C 5.7 07/30/2018   There were no vitals filed for this visit.  Lipid Panel     Component Value Date/Time   CHOL 125 12/10/2017 1031   TRIG 93 12/10/2017 1031   HDL 33 (L) 12/10/2017 1031   CHOLHDL 3.8 12/10/2017 1031   CHOLHDL 4.3 06/16/2015 0921   VLDL 13 06/16/2015 0921   LDLCALC 73 12/10/2017 1031   LDLDIRECT 90 07/13/2017 1606   LDLDIRECT 88 02/04/2016 1115    Home fasting CBG: 80s-120s  Clinical ASCVD: No   A/P: Diabetes longstanding currently controlled. Patient is able to verbalize appropriate hypoglycemia management plan. Patient is adherent with medication. -Continued GLP-1 Victoza (generic name liraglutide) 1.2mg  daily and Metformin 1000 mg BID. -Extensively discussed pathophysiology of DM, recommended lifestyle interventions, dietary effects on glycemic control -Counseled on s/sx of and management of hypoglycemia -Next A1C anticipated 10/2018.   ASCVD risk - primary prevention in patient with DM. Last LDL is controlled at 73.  -Continued aspirin 81 mg  -Continued atorvastatin 80 mg.   Hypertension longstanding currently controlled.  BP goal = 130 mmHg. Patient reports adherence with medication. Patient reports feeling lightheaded. When moving from sitting to standing in office, pulse increased from 78 to 88. BP 104/76 when sitting. Due to symptoms indicative of orthostatic hypotension, discontinue HCTZ 12.5 mg. -Continue Carvedilol 3.125 mg BID -Continue losartan 50 mg   Written patient instructions provided.  Total time in face to face counseling 30 minutes.   Follow up PCP Clinic Visit with Dr. Yisroel Ramming in 4 weeks.   Patient seen with Emeline General, PharmD Candidate and Catie Darnelle Maffucci, PharmD,  PGY2 Pharmacy Resident.

## 2018-09-12 NOTE — Assessment & Plan Note (Signed)
Diabetes longstanding currently controlled. Patient is able to verbalize appropriate hypoglycemia management plan. Patient is adherent with medication. -Continued GLP-1 Victoza (generic name liraglutide) 1.2mg  daily and Metformin 1000 mg BID. -Extensively discussed pathophysiology of DM, recommended lifestyle interventions, dietary effects on glycemic control -Counseled on s/sx of and management of hypoglycemia -Next A1C anticipated 10/2018.

## 2018-09-12 NOTE — Assessment & Plan Note (Signed)
ASCVD risk - primary prevention in patient with DM. Last LDL is controlled at 73.  -Continued aspirin 81 mg  -Continued atorvastatin 80 mg.

## 2018-09-12 NOTE — Assessment & Plan Note (Signed)
Hypertension longstanding currently controlled.  BP goal = 130 mmHg. Patient reports adherence with medication. Patient reports feeling lightheaded. When moving from sitting to standing in office, pulse increased from 78 to 88. BP 104/76 when sitting. Due to symptoms indicative of orthostatic hypotension, discontinue HCTZ 12.5 mg. -Continue Carvedilol 3.125 mg BID -Continue losartan 50 mg -Discontinue HCTZ 12.5 mg

## 2018-09-13 NOTE — Progress Notes (Signed)
Patient ID: Craig [name], male   DOB: [birth year], 58 y.o.   MRN: [id number] Reviewed: I agree with Dr. Graylin Shiver documentation and management.

## 2018-10-02 ENCOUNTER — Other Ambulatory Visit: Payer: Self-pay | Admitting: Family Medicine

## 2018-10-02 DIAGNOSIS — M25562 Pain in left knee: Principal | ICD-10-CM

## 2018-10-02 DIAGNOSIS — M25561 Pain in right knee: Secondary | ICD-10-CM

## 2018-10-07 ENCOUNTER — Other Ambulatory Visit: Payer: Self-pay | Admitting: Family Medicine

## 2018-10-08 ENCOUNTER — Other Ambulatory Visit: Payer: Self-pay | Admitting: Family Medicine

## 2018-10-08 DIAGNOSIS — I10 Essential (primary) hypertension: Secondary | ICD-10-CM

## 2018-10-08 DIAGNOSIS — E785 Hyperlipidemia, unspecified: Secondary | ICD-10-CM

## 2018-10-08 DIAGNOSIS — E1169 Type 2 diabetes mellitus with other specified complication: Secondary | ICD-10-CM

## 2018-10-09 ENCOUNTER — Ambulatory Visit: Payer: Medicaid Other | Admitting: Family Medicine

## 2018-10-14 ENCOUNTER — Other Ambulatory Visit: Payer: Self-pay | Admitting: Family Medicine

## 2018-10-14 DIAGNOSIS — I10 Essential (primary) hypertension: Secondary | ICD-10-CM

## 2018-10-14 DIAGNOSIS — E785 Hyperlipidemia, unspecified: Secondary | ICD-10-CM

## 2018-10-14 DIAGNOSIS — E1169 Type 2 diabetes mellitus with other specified complication: Secondary | ICD-10-CM

## 2018-10-29 ENCOUNTER — Telehealth: Payer: Self-pay | Admitting: Family Medicine

## 2018-10-29 ENCOUNTER — Other Ambulatory Visit: Payer: Self-pay | Admitting: Family Medicine

## 2018-10-29 DIAGNOSIS — E1165 Type 2 diabetes mellitus with hyperglycemia: Secondary | ICD-10-CM

## 2018-10-29 DIAGNOSIS — I10 Essential (primary) hypertension: Secondary | ICD-10-CM

## 2018-10-29 MED ORDER — LIRAGLUTIDE 18 MG/3ML ~~LOC~~ SOPN: 1.2000 mg | PEN_INJECTOR | Freq: Every day | SUBCUTANEOUS | 11 refills | Status: DC

## 2018-10-29 MED ORDER — VALSARTAN 160 MG PO TABS: 160.0000 mg | ORAL_TABLET | Freq: Every day | ORAL | 3 refills | Status: DC

## 2018-10-29 NOTE — Telephone Encounter (Signed)
Contacted patient regarding switch of scheduled visit on 10/30/2018.  Rescheduled for 12/02/2018.  Patient reports good adherence to his medications and is requesting a refill for his Victoza.  He reports good tolerance.  He also needs a refill for an alternative to his losartan because it "was a bad batch" and his pharmacy will not refill it.  Switching to valsartan.  Patient without further questions or concerns.  Advised him to contact [facility] if he has any further questions in the interim.  Harriet Butte, [place], PGY-3

## 2018-10-29 NOTE — Telephone Encounter (Signed)
Pt called to cancel his appt that was tomorrow (10-30-2018) with Dr. Yisroel Ramming due to transportation cancelling all transportation services. Pt would like McMullen to call him to discuss a medication his pharmacy told him they have not been able to get due to it being a 'bad batch.' The medication is Losartan. Please call pt to discuss this.

## 2018-10-29 NOTE — Telephone Encounter (Signed)
See phone note.  David McMullen, DO [facility], PGY-3 

## 2018-10-30 ENCOUNTER — Ambulatory Visit: Payer: Medicaid Other | Admitting: Family Medicine

## 2018-12-02 ENCOUNTER — Ambulatory Visit: Payer: Medicaid Other | Admitting: Family Medicine

## 2018-12-02 ENCOUNTER — Telehealth (INDEPENDENT_AMBULATORY_CARE_PROVIDER_SITE_OTHER): Payer: Medicaid Other | Admitting: Family Medicine

## 2018-12-02 ENCOUNTER — Other Ambulatory Visit: Payer: Self-pay

## 2018-12-02 DIAGNOSIS — E119 Type 2 diabetes mellitus without complications: Secondary | ICD-10-CM

## 2018-12-02 DIAGNOSIS — K089 Disorder of teeth and supporting structures, unspecified: Secondary | ICD-10-CM

## 2018-12-02 DIAGNOSIS — I10 Essential (primary) hypertension: Secondary | ICD-10-CM

## 2018-12-02 HISTORY — DX: Disorder of teeth and supporting structures, unspecified: K08.9

## 2018-12-02 MED ORDER — GLUCOSE BLOOD VI STRP: 1.0000 | ORAL_STRIP | Freq: Three times a day (TID) | 0 refills | Status: DC

## 2018-12-02 MED ORDER — VALSARTAN 160 MG PO TABS: 160.0000 mg | ORAL_TABLET | Freq: Every day | ORAL | 0 refills | Status: DC

## 2018-12-02 NOTE — Assessment & Plan Note (Signed)
Per his report his blood sugars have been trending in the 726O over 70 systolic range.  Had to have his losartan switch to valsartan secondary to contamination of the left losartan.  Requesting refill of valsartan, sent this to pharmacy.  Can follow-up in 3 months for BMP and BP check.

## 2018-12-02 NOTE — Progress Notes (Signed)
[facility] Visit  Patient consented to have virtual visit. Method of visit: Telephone  Encounter participants: Patient: Craig [name] - located at home Provider: Guadalupe Dawn - located at [facility]  Chief Complaint: Blood pressure and blood sugar follow-up  HPI: 58 year old male who was contacted for follow-up on blood pressure and blood sugars.  He states that his CBG was 93 this morning before breakfast and he has not checked it again as he has just eaten lunch.  A1c 5.7 back in December 2019.  On metformin 1 g twice daily.  Unclear if AC HS checking is necessary.  Patient states he checked his blood pressure yesterday after working out.  It was 126/75.  His work-up included doing some [employer] at home and biking.  He is unable to estimate how long he did the [employer] for or how long he biked for.  States that he would like to come get his weight checked as he thinks he has lost some weight.  Patient states that he was supposed to have dental work done prior to the code outbreak.  He has some broken teeth.  They are not painful and he has no symptoms of purulent material, blood, or any other alarming symptoms with his teeth.  Patient requesting refill of his valsartan and will A1c test trips.   ROS: per HPI  Pertinent PMHx: Hypertension, type 2 diabetes  Exam:  General: Very pleasant, no distress Respiratory: No increased work of breathing, nonlabored respiration, no distress Psych: Appropriate  Assessment/Plan:  Primary hypertension Per his report his blood sugars have been trending in the 935T over 70 systolic range.  Had to have his losartan switch to valsartan secondary to contamination of the left losartan.  Requesting refill of valsartan, sent this to pharmacy.  Can follow-up in 3 months for BMP and BP check.  Controlled type 2 diabetes mellitus without complication, without long-term current use of insulin (HCC) Last A1c 5.7 in December  2019.  Still checking blood sugar AC HS, unclear benefit for this frequent checking.  Taking metformin 1 g twice daily.  Taking Victoza 1.2 mg daily.  Recheck A1c in 3 months  Poor dentition Description of poor dentition.  Reviewed alarm signs with patient aware he should seek medical attention for his teeth.  These include intractable pain, purulent material, swelling.  Patient voiced understanding and is in agreement.    Time spent during visit with patient:18 minutes  Guadalupe Dawn MD PGY-2 Family Medicine Resident

## 2018-12-02 NOTE — Assessment & Plan Note (Signed)
Description of poor dentition.  Reviewed alarm signs with patient aware he should seek medical attention for his teeth.  These include intractable pain, purulent material, swelling.  Patient voiced understanding and is in agreement.

## 2018-12-02 NOTE — Assessment & Plan Note (Signed)
Last A1c 5.7 in December 2019.  Still checking blood sugar AC HS, unclear benefit for this frequent checking.  Taking metformin 1 g twice daily.  Taking Victoza 1.2 mg daily.  Recheck A1c in 3 months

## 2018-12-16 ENCOUNTER — Telehealth: Payer: Self-pay | Admitting: *Deleted

## 2018-12-16 NOTE — Telephone Encounter (Signed)
Pt calls because he has been unable to pick up the valsartan and the pharmacy told them that they need a hardcopy.  Called and spoke with [facility].  There was an issue with the e-script being converted in their system.  Gave verbal to fill.  Pt informed.  Christen Bame, CMA

## 2019-01-28 ENCOUNTER — Other Ambulatory Visit: Payer: Self-pay | Admitting: *Deleted

## 2019-01-28 DIAGNOSIS — R202 Paresthesia of skin: Secondary | ICD-10-CM

## 2019-01-28 DIAGNOSIS — G5603 Carpal tunnel syndrome, bilateral upper limbs: Secondary | ICD-10-CM

## 2019-01-28 MED ORDER — GABAPENTIN 300 MG PO CAPS: ORAL_CAPSULE | ORAL | 2 refills | Status: DC

## 2019-01-30 ENCOUNTER — Other Ambulatory Visit: Payer: Self-pay | Admitting: Family Medicine

## 2019-02-04 DIAGNOSIS — H2513 Age-related nuclear cataract, bilateral: Secondary | ICD-10-CM | POA: Diagnosis not present

## 2019-02-04 DIAGNOSIS — Z7984 Long term (current) use of oral hypoglycemic drugs: Secondary | ICD-10-CM | POA: Diagnosis not present

## 2019-02-04 DIAGNOSIS — E119 Type 2 diabetes mellitus without complications: Secondary | ICD-10-CM | POA: Diagnosis not present

## 2019-03-14 ENCOUNTER — Other Ambulatory Visit: Payer: Self-pay

## 2019-03-14 DIAGNOSIS — I10 Essential (primary) hypertension: Secondary | ICD-10-CM

## 2019-03-17 MED ORDER — VALSARTAN 160 MG PO TABS: 160.0000 mg | ORAL_TABLET | Freq: Every day | ORAL | 0 refills | Status: DC

## 2019-03-21 ENCOUNTER — Other Ambulatory Visit: Payer: Self-pay | Admitting: *Deleted

## 2019-03-24 MED ORDER — ACCU-CHEK AVIVA PLUS VI STRP: ORAL_STRIP | 12 refills | Status: DC

## 2019-04-08 ENCOUNTER — Other Ambulatory Visit: Payer: Self-pay | Admitting: Family Medicine

## 2019-04-08 DIAGNOSIS — I10 Essential (primary) hypertension: Secondary | ICD-10-CM

## 2019-04-09 MED ORDER — METFORMIN HCL 1000 MG PO TABS: 1000.0000 mg | ORAL_TABLET | Freq: Two times a day (BID) | ORAL | 3 refills | Status: DC

## 2019-04-09 MED ORDER — ASPIRIN 81 MG PO TBEC: DELAYED_RELEASE_TABLET | ORAL | 3 refills | Status: DC

## 2019-04-11 ENCOUNTER — Ambulatory Visit: Payer: Medicaid Other | Admitting: Family Medicine

## 2019-04-11 ENCOUNTER — Encounter: Payer: Self-pay | Admitting: Family Medicine

## 2019-04-11 ENCOUNTER — Other Ambulatory Visit: Payer: Self-pay

## 2019-04-11 VITALS — BP 118/86 | HR 69 | Wt 396.0 lb

## 2019-04-11 DIAGNOSIS — K089 Disorder of teeth and supporting structures, unspecified: Secondary | ICD-10-CM | POA: Diagnosis not present

## 2019-04-11 DIAGNOSIS — I10 Essential (primary) hypertension: Secondary | ICD-10-CM | POA: Diagnosis not present

## 2019-04-11 DIAGNOSIS — Z23 Encounter for immunization: Secondary | ICD-10-CM | POA: Diagnosis not present

## 2019-04-11 DIAGNOSIS — E785 Hyperlipidemia, unspecified: Secondary | ICD-10-CM | POA: Diagnosis not present

## 2019-04-11 DIAGNOSIS — Z6841 Body Mass Index (BMI) 40.0 and over, adult: Secondary | ICD-10-CM

## 2019-04-11 DIAGNOSIS — E119 Type 2 diabetes mellitus without complications: Secondary | ICD-10-CM | POA: Diagnosis not present

## 2019-04-11 DIAGNOSIS — F32A Depression, unspecified: Secondary | ICD-10-CM

## 2019-04-11 DIAGNOSIS — D649 Anemia, unspecified: Secondary | ICD-10-CM | POA: Diagnosis not present

## 2019-04-11 DIAGNOSIS — Z7689 Persons encountering health services in other specified circumstances: Secondary | ICD-10-CM | POA: Diagnosis not present

## 2019-04-11 DIAGNOSIS — F329 Major depressive disorder, single episode, unspecified: Secondary | ICD-10-CM

## 2019-04-11 DIAGNOSIS — E1169 Type 2 diabetes mellitus with other specified complication: Secondary | ICD-10-CM

## 2019-04-11 DIAGNOSIS — Z0000 Encounter for general adult medical examination without abnormal findings: Secondary | ICD-10-CM

## 2019-04-11 DIAGNOSIS — F419 Anxiety disorder, unspecified: Secondary | ICD-10-CM

## 2019-04-11 DIAGNOSIS — B351 Tinea unguium: Secondary | ICD-10-CM

## 2019-04-11 DIAGNOSIS — B353 Tinea pedis: Secondary | ICD-10-CM

## 2019-04-11 LAB — POCT GLYCOSYLATED HEMOGLOBIN (HGB A1C): HbA1c, POC (controlled diabetic range): 6.1 % (ref 0.0–7.0)

## 2019-04-11 MED ORDER — TERBINAFINE HCL 1 % EX CREA: 1.0000 "application " | TOPICAL_CREAM | Freq: Two times a day (BID) | CUTANEOUS | 1 refills | Status: DC

## 2019-04-11 NOTE — Progress Notes (Signed)
Today we discussed your diabetes you will return in 3 months to assess that anemia, cholesterol  Subjective:    Patient ID: Craig [name], male    DOB: 09-Feb-1961, 58 y.o.   MRN: [id number]   CC: Diabetes check  HPI: Craig [name] is a 58 year old male that presents today to meet his new PCP and to have a diabetes follow-up.  Today his concerns are his blood pressure, weight gain, and diabetes.  Upon entering the office today his blood pressure was 130/96.  I rechecked it at the end of our visit it was 118/86.  He has continually ran normotensive in the office.   Craig [name] has gain 23 lbs since his last visit in January. He attributes this to not being able to work out since [place] happened. We discussed healthy food options as well as exercise and weight loss goals.  His diabetes is well controlled. He is taking metformin as well as liraglutide. His fingerstick glucose is averaging 90 at home. Today his A1c is 6.1. Stated last eye exam was this year.  Other health maintenance issues we discussed were the need for oral surgery but a delay with that due to insurance obstacles. His toenail fungus is improving with lamisil cream. He desires the flu shot today.  Smoking status reviewed.  Review of Systems Per HPI, also denies recent illness, fever, headache, changes in vision, chest pain, shortness of breath, abdominal pain, N/V/D, weakness   Patient Active Problem List   Diagnosis Date Noted  . Poor dentition 12/02/2018  . Onychomycosis 08/27/2018  . Perioral numbness 07/30/2018  . Anxiety and depression 08/17/2017  . Right hand paresthesia 03/26/2017  . Bilateral carpal tunnel syndrome 08/03/2016  . Anemia 12/15/2014  . Hyperlipidemia associated with type 2 diabetes mellitus ([place]) 01/01/2013  . Controlled type 2 diabetes mellitus without complication, without long-term current use of insulin ([facility]) 09/17/2012  . Morbid obesity with BMI of 50.0-59.9, adult ([place]) 06/05/2012  . Primary  hypertension 04/30/2012  . GERD (gastroesophageal reflux disease) 03/27/2012  . History of gastric cancer 02/14/2012     Objective:  There were no vitals taken for this visit. Vitals and nursing note reviewed  General: NAD, pleasant Cardiac: RRR, normal heart sounds, no murmurs Respiratory: CTAB, normal effort Abdomen: soft, nontender, nondistended Extremities: no edema or cyanosis. WWP. Skin: warm and dry, no rashes noted Neuro: alert and oriented, no focal deficits Psych: normal affect  Assessment & Plan:    Controlled type 2 diabetes mellitus without complication, without long-term current use of insulin ([place]) 1. Continue liraglutide 1.2 mg daily, metformin 1000 mg twice daily, atorvastatin 80 mg daily. 2.  POCT A1c 6.1 3.  Continue eating fruits and vegetables, you to cut back on carbohydrates sugary foods. 4.  Return in 2 months for diabetes follow-up   Poor dentition Needs dental surgery. Not sure if insurance will cover.   Primary hypertension -Continue taking carvedilol 3.125 two times daily, valsartan 160mg  daily -Rechecked BP today 118/86 -Goal is to walk 3 times a day     Hyperlipidemia associated with type 2 diabetes mellitus (HCC) -Continue atorvastatin 80 mg daily -Lipid panel  -Continue to stay away from fried foods and saturated fats increase diet in fruits and vegetables continue to decrease carbohydrates sugars  Onychomycosis -Improving. -Refill on Lamisil cream -Follow-up in 2 months  Morbid obesity with BMI of 50.0-59.9, adult (HCC) -Recommend short-term goal of walking outside 3 days a week, decreasing diet high in carbs traded fats -Return in  2 months to reassess weight loss goals  Anemia -CBC -Denies symptomatic anemia  Anxiety and depression -Non medicinal approach, seems to be more acute than chronic -PHQ-9 score of 1  Healthcare maintenance -Last diabetic exam this year (need to retrieve records) -Influenza vaccine received today    Gerlene Fee, [place] PGY-1

## 2019-04-11 NOTE — Assessment & Plan Note (Signed)
-  Continue taking carvedilol 3.125 two times daily, valsartan 160mg  daily -Rechecked BP today 118/86 -Goal is to walk 3 times a day

## 2019-04-11 NOTE — Assessment & Plan Note (Addendum)
1. Continue liraglutide 1.2 mg daily, metformin 1000 mg twice daily, atorvastatin 80 mg daily. 2.  POCT A1c 6.1 3.  Continue eating fruits and vegetables, you to cut back on carbohydrates sugary foods. 4.  Return in 2 months for diabetes follow-up

## 2019-04-11 NOTE — Assessment & Plan Note (Signed)
-  Last diabetic exam this year (need to retrieve records) -Influenza vaccine received today

## 2019-04-11 NOTE — Assessment & Plan Note (Signed)
-  Improving. -Refill on Lamisil cream -Follow-up in 2 months

## 2019-04-11 NOTE — Assessment & Plan Note (Signed)
-  Continue atorvastatin 80 mg daily -Lipid panel  -Continue to stay away from fried foods and saturated fats increase diet in fruits and vegetables continue to decrease carbohydrates sugars

## 2019-04-11 NOTE — Assessment & Plan Note (Signed)
-  CBC -Denies symptomatic anemia

## 2019-04-11 NOTE — Assessment & Plan Note (Signed)
-  Recommend short-term goal of walking outside 3 days a week, decreasing diet high in carbs traded fats -Return in 2 months to reassess weight loss goals

## 2019-04-11 NOTE — Assessment & Plan Note (Signed)
-  Non medicinal approach, seems to be more acute than chronic -PHQ-9 score of 1

## 2019-04-11 NOTE — Assessment & Plan Note (Signed)
Needs dental surgery. Not sure if insurance will cover.

## 2019-04-11 NOTE — Patient Instructions (Addendum)
Dear Craig [name],   It was very nice to meet you today thank you for letting me participate in your care.  Today you were seen for your diabetes, anxiety and depression, cholesterol, obesity, hypertension.  For your diabetes we discussed decreasing your A1c.  You will do this by eating more fruits and vegetables and decreasing your carbohydrate and sugar intake.  It would also benefit you to start by walking 3 times a day and increasing to 5 times a day for 30 minutes.  This will also help with your cholesterol levels your weight gain and hypertension as well as increase your mood.  Have a wonderful day and please return to see me in 2 months for a follow-up on these things as well as your diabetes.   Dr. Janus Molder, [facility] PGY-1   Diabetes Mellitus and Nutrition, Adult When you have diabetes (diabetes mellitus), it is very important to have healthy eating habits because your [name] sugar (glucose) levels are greatly affected by what you eat and drink. Eating healthy foods in the appropriate amounts, at about the same times every day, can help you:  Control your [name] glucose.  Lower your risk of heart disease.  Improve your [name] pressure.  Reach or maintain a healthy weight. Every person with diabetes is different, and each person has different needs for a meal plan. Your health care provider may recommend that you work with a diet and nutrition specialist (dietitian) to make a meal plan that is best for you. Your meal plan may vary depending on factors such as:  The calories you need.  The medicines you take.  Your weight.  Your [name] glucose, [name] pressure, and cholesterol levels.  Your activity level.  Other health conditions you have, such as heart or kidney disease. How do carbohydrates affect me? Carbohydrates, also called carbs, affect your [name] glucose level more than any other type of food. Eating carbs naturally raises the amount of glucose in  your [name]. Carb counting is a method for keeping track of how many carbs you eat. Counting carbs is important to keep your [name] glucose at a healthy level, especially if you use insulin or take certain oral diabetes medicines. It is important to know how many carbs you can safely have in each meal. This is different for every person. Your dietitian can help you calculate how many carbs you should have at each meal and for each snack. Foods that contain carbs include:  Bread, cereal, rice, pasta, and crackers.  Potatoes and corn.  Peas, beans, and lentils.  Milk and yogurt.  Fruit and juice.  Desserts, such as cakes, cookies, ice cream, and candy. How does alcohol affect me? Alcohol can cause a sudden decrease in [name] glucose (hypoglycemia), especially if you use insulin or take certain oral diabetes medicines. Hypoglycemia can be a life-threatening condition. Symptoms of hypoglycemia (sleepiness, dizziness, and confusion) are similar to symptoms of having too much alcohol. If your health care provider says that alcohol is safe for you, follow these guidelines:  Limit alcohol intake to no more than 1 drink per day for nonpregnant women and 2 drinks per day for men. One drink equals 12 oz of beer, 5 oz of wine, or 1 oz of hard liquor.  Do not drink on an empty stomach.  Keep yourself hydrated with water, diet soda, or unsweetened iced tea.  Keep in mind that regular soda, juice, and other mixers may contain a lot of sugar  and must be counted as carbs. What are tips for following this plan?  Reading food labels  Start by checking the serving size on the "Nutrition Facts" label of packaged foods and drinks. The amount of calories, carbs, fats, and other nutrients listed on the label is based on one serving of the item. Many items contain more than one serving per package.  Check the total grams (g) of carbs in one serving. You can calculate the number of servings of carbs in one  serving by dividing the total carbs by 15. For example, if a food has 30 g of total carbs, it would be equal to 2 servings of carbs.  Check the number of grams (g) of saturated and trans fats in one serving. Choose foods that have low or no amount of these fats.  Check the number of milligrams (mg) of salt (sodium) in one serving. Most people should limit total sodium intake to less than 2,300 mg per day.  Always check the nutrition information of foods labeled as "low-fat" or "nonfat". These foods may be higher in added sugar or refined carbs and should be avoided.  Talk to your dietitian to identify your daily goals for nutrients listed on the label. Shopping  Avoid buying canned, premade, or processed foods. These foods tend to be high in fat, sodium, and added sugar.  Shop around the outside edge of the grocery store. This includes fresh fruits and vegetables, bulk grains, fresh meats, and fresh dairy. Cooking  Use low-heat cooking methods, such as baking, instead of high-heat cooking methods like deep frying.  Cook using healthy oils, such as olive, canola, or sunflower oil.  Avoid cooking with butter, cream, or high-fat meats. Meal planning  Eat meals and snacks regularly, preferably at the same times every day. Avoid going long periods of time without eating.  Eat foods high in fiber, such as fresh fruits, vegetables, beans, and whole grains. Talk to your dietitian about how many servings of carbs you can eat at each meal.  Eat 4-6 ounces (oz) of lean protein each day, such as lean meat, chicken, fish, eggs, or tofu. One oz of lean protein is equal to: ? 1 oz of meat, chicken, or fish. ? 1 egg. ?  cup of tofu.  Eat some foods each day that contain healthy fats, such as avocado, nuts, seeds, and fish. Lifestyle  Check your [name] glucose regularly.  Exercise regularly as told by your health care provider. This may include: ? 150 minutes of moderate-intensity or  vigorous-intensity exercise each week. This could be brisk walking, biking, or water aerobics. ? Stretching and doing strength exercises, such as yoga or weightlifting, at least 2 times a week.  Take medicines as told by your health care provider.  Do not use any products that contain nicotine or tobacco, such as cigarettes and e-cigarettes. If you need help quitting, ask your health care provider.  Work with a Social worker or diabetes educator to identify strategies to manage stress and any emotional and social challenges. Questions to ask a health care provider  Do I need to meet with a diabetes educator?  Do I need to meet with a dietitian?  What number can I call if I have questions?  When are the best times to check my [name] glucose? Where to find more information:  American Diabetes Association: diabetes.org  Academy of Nutrition and Dietetics: www.eatright.[employer] of Diabetes and Digestive and Kidney Diseases (NIH): [employer] Summary  A  healthy meal plan will help you control your [name] glucose and maintain a healthy lifestyle.  Working with a diet and nutrition specialist (dietitian) can help you make a meal plan that is best for you.  Keep in mind that carbohydrates (carbs) and alcohol have immediate effects on your [name] glucose levels. It is important to count carbs and to use alcohol carefully. This information is not intended to replace advice given to you by your health care provider. Make sure you discuss any questions you have with your health care provider. Document Released: 04/27/2005 Document Revised: 07/13/2017 Document Reviewed: 09/04/2016 Elsevier Patient Education  2020 [employer].

## 2019-04-12 LAB — CBC WITH DIFFERENTIAL/PLATELET
Basophils Absolute: 0 10*3/uL (ref 0.0–0.2)
Basos: 0 %
EOS (ABSOLUTE): 0.2 10*3/uL (ref 0.0–0.4)
Eos: 3 %
Hematocrit: 37.8 % (ref 37.5–51.0)
Hemoglobin: 12.3 g/dL — ABNORMAL LOW (ref 13.0–17.7)
Immature Grans (Abs): 0 10*3/uL (ref 0.0–0.1)
Immature Granulocytes: 0 %
Lymphocytes Absolute: 2.3 10*3/uL (ref 0.7–3.1)
Lymphs: 33 %
MCH: 27.6 pg (ref 26.6–33.0)
MCHC: 32.5 g/dL (ref 31.5–35.7)
MCV: 85 fL (ref 79–97)
Monocytes Absolute: 0.6 10*3/uL (ref 0.1–0.9)
Monocytes: 9 %
Neutrophils Absolute: 3.8 10*3/uL (ref 1.4–7.0)
Neutrophils: 55 %
Platelets: 287 10*3/uL (ref 150–450)
RBC: 4.46 x10E6/uL (ref 4.14–5.80)
RDW: 13.5 % (ref 11.6–15.4)
WBC: 6.9 10*3/uL (ref 3.4–10.8)

## 2019-04-12 LAB — BASIC METABOLIC PANEL
BUN/Creatinine Ratio: 10 (ref 9–20)
BUN: 10 mg/dL (ref 6–24)
CO2: 25 mmol/L (ref 20–29)
Calcium: 9.3 mg/dL (ref 8.7–10.2)
Chloride: 101 mmol/L (ref 96–106)
Creatinine, Ser: 0.96 mg/dL (ref 0.76–1.27)
GFR calc Af Amer: 102 mL/min/{1.73_m2} (ref >59)
GFR calc non Af Amer: 88 mL/min/{1.73_m2} (ref >59)
Glucose: 75 mg/dL (ref 65–99)
Potassium: 4.7 mmol/L (ref 3.5–5.2)
Sodium: 141 mmol/L (ref 134–144)

## 2019-04-12 LAB — LIPID PANEL
Chol/HDL Ratio: 3.3 ratio (ref 0.0–5.0)
Cholesterol, Total: 144 mg/dL (ref 100–199)
HDL: 43 mg/dL (ref >39)
LDL Calculated: 85 mg/dL (ref 0–99)
Triglycerides: 78 mg/dL (ref 0–149)
VLDL Cholesterol Cal: 16 mg/dL (ref 5–40)

## 2019-04-12 NOTE — Progress Notes (Signed)
Patient called. Could not reach patient. Left a message for patient to call the office for results.

## 2019-06-12 ENCOUNTER — Other Ambulatory Visit: Payer: Self-pay

## 2019-06-12 ENCOUNTER — Ambulatory Visit (INDEPENDENT_AMBULATORY_CARE_PROVIDER_SITE_OTHER): Payer: Medicaid Other | Admitting: Family Medicine

## 2019-06-12 ENCOUNTER — Encounter: Payer: Self-pay | Admitting: Family Medicine

## 2019-06-12 VITALS — BP 138/84 | HR 78 | Wt 396.0 lb

## 2019-06-12 DIAGNOSIS — B351 Tinea unguium: Secondary | ICD-10-CM | POA: Diagnosis not present

## 2019-06-12 DIAGNOSIS — B353 Tinea pedis: Secondary | ICD-10-CM | POA: Diagnosis not present

## 2019-06-12 DIAGNOSIS — Z85028 Personal history of other malignant neoplasm of stomach: Secondary | ICD-10-CM | POA: Diagnosis not present

## 2019-06-12 DIAGNOSIS — Z7689 Persons encountering health services in other specified circumstances: Secondary | ICD-10-CM | POA: Diagnosis not present

## 2019-06-12 DIAGNOSIS — D649 Anemia, unspecified: Secondary | ICD-10-CM

## 2019-06-12 DIAGNOSIS — K089 Disorder of teeth and supporting structures, unspecified: Secondary | ICD-10-CM | POA: Diagnosis not present

## 2019-06-12 DIAGNOSIS — E119 Type 2 diabetes mellitus without complications: Secondary | ICD-10-CM

## 2019-06-12 DIAGNOSIS — G5603 Carpal tunnel syndrome, bilateral upper limbs: Secondary | ICD-10-CM

## 2019-06-12 DIAGNOSIS — Z6841 Body Mass Index (BMI) 40.0 and over, adult: Secondary | ICD-10-CM

## 2019-06-12 MED ORDER — TERBINAFINE HCL 1 % EX CREA: 1.0000 "application " | TOPICAL_CREAM | Freq: Two times a day (BID) | CUTANEOUS | 1 refills | Status: DC
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
## 2019-06-12 NOTE — Patient Instructions (Addendum)
It was very nice to meet you today. Please enjoy the rest of your week. Today you were seen for a follow up visit. Follow up in december or sooner if needed. Please come for A1c on 07/01/2019 when you come to meet with Dr. Valentina Lucks.  Please call the clinic at [phone] if your symptoms worsen or you have any concerns. It was our pleasure to serve you.     [facility]  Address: [address], [place],  [address] Hours:  Open ? Closes 6PM Phone: [phone]  Exercising to Lose Weight Exercise is structured, repetitive physical activity to improve fitness and health. Getting regular exercise is important for everyone. It is especially important if you are overweight. Being overweight increases your risk of heart disease, stroke, diabetes, high blood pressure, and several types of cancer. Reducing your calorie intake and exercising can help you lose weight. Exercise is usually categorized as moderate or vigorous intensity. To lose weight, most people need to do a certain amount of moderate-intensity or vigorous-intensity exercise each week. Moderate-intensity exercise  Moderate-intensity exercise is any activity that gets you moving enough to burn at least three times more energy (calories) than if you were sitting. Examples of moderate exercise include:  Walking a mile in 15 minutes.  Doing light yard work.  Biking at an easy pace. Most people should get at least 150 minutes (2 hours and 30 minutes) a week of moderate-intensity exercise to maintain their body weight. Vigorous-intensity exercise Vigorous-intensity exercise is any activity that gets you moving enough to burn at least six times more calories than if you were sitting. When you exercise at this intensity, you should be working hard enough that you are not able to carry on a conversation. Examples of vigorous exercise include:  Running.  Playing a team sport, such as football, basketball, and  soccer.  Jumping rope. Most people should get at least 75 minutes (1 hour and 15 minutes) a week of vigorous-intensity exercise to maintain their body weight. How can exercise affect me? When you exercise enough to burn more calories than you eat, you lose weight. Exercise also reduces body fat and builds muscle. The more muscle you have, the more calories you burn. Exercise also:  Improves mood.  Reduces stress and tension.  Improves your overall fitness, flexibility, and endurance.  Increases bone strength. The amount of exercise you need to lose weight depends on:  Your age.  The type of exercise.  Any health conditions you have.  Your overall physical ability. Talk to your health care provider about how much exercise you need and what types of activities are safe for you. What actions can I take to lose weight? Nutrition   Make changes to your diet as told by your health care provider or diet and nutrition specialist (dietitian). This may include: ? Eating fewer calories. ? Eating more protein. ? Eating less unhealthy fats. ? Eating a diet that includes fresh fruits and vegetables, whole grains, low-fat dairy products, and lean protein. ? Avoiding foods with added fat, salt, and sugar.  Drink plenty of water while you exercise to prevent dehydration or heat stroke. Activity  Choose an activity that you enjoy and set realistic goals. Your health care provider can help you make an exercise plan that works for you.  Exercise at a moderate or vigorous intensity most days of the week. ? The intensity of exercise may vary from person to person. You can tell how intense a  workout is for you by paying attention to your breathing and heartbeat. Most people will notice their breathing and heartbeat get faster with more intense exercise.  Do resistance training twice each week, such as: ? Push-ups. ? Sit-ups. ? Lifting weights. ? Using resistance bands.  Getting short  amounts of exercise can be just as helpful as long structured periods of exercise. If you have trouble finding time to exercise, try to include exercise in your daily routine. ? Get up, stretch, and walk around every 30 minutes throughout the day. ? Go for a walk during your lunch break. ? Park your car farther away from your destination. ? If you take public transportation, get off one stop early and walk the rest of the way. ? Make phone calls while standing up and walking around. ? Take the stairs instead of elevators or escalators.  Wear comfortable clothes and shoes with good support.  Do not exercise so much that you hurt yourself, feel dizzy, or get very short of breath. Where to find more information  U.S. Department of Health and Human Services: [employer]at  Centers for Disease Control and Prevention (CDC): http://www.wolf.info/ Contact a health care provider:  Before starting a new exercise program.  If you have questions or concerns about your weight.  If you have a medical problem that keeps you from exercising. Get help right away if you have any of the following while exercising:  Injury.  Dizziness.  Difficulty breathing or shortness of breath that does not go away when you stop exercising.  Chest pain.  Rapid heartbeat. Summary  Being overweight increases your risk of heart disease, stroke, diabetes, high blood pressure, and several types of cancer.  Losing weight happens when you burn more calories than you eat.  Reducing the amount of calories you eat in addition to getting regular moderate or vigorous exercise each week helps you lose weight. This information is not intended to replace advice given to you by your health care provider. Make sure you discuss any questions you have with your health care provider. Document Released: 09/02/2010 Document Revised: 08/13/2017 Document Reviewed: 08/13/2017 Elsevier Patient Education  2020 [employer].

## 2019-06-13 ENCOUNTER — Other Ambulatory Visit: Payer: Self-pay

## 2019-06-13 DIAGNOSIS — G5603 Carpal tunnel syndrome, bilateral upper limbs: Secondary | ICD-10-CM

## 2019-06-13 DIAGNOSIS — R202 Paresthesia of skin: Secondary | ICD-10-CM

## 2019-06-16 MED ORDER — GABAPENTIN 300 MG PO CAPS: ORAL_CAPSULE | ORAL | 2 refills | Status: DC

## 2019-06-16 NOTE — Assessment & Plan Note (Signed)
Needs improvement. Same weight reading from last visit in August. Appears to be motivated.  He would benefit more from diet and exercise but a combination of the two will also be beneficial.  There is no specific diet that has strong evidence for weight loss and I strongly advised against weight loss pills. -Referral to healthy weight loss and wellness -Handout on nutrition given -Follow-up in December

## 2019-06-16 NOTE — Assessment & Plan Note (Signed)
Asymptomatic. Chronic. Stable. Hgb 12.3. Baseline 11.4-14.2. -Obtain colonoscopy records -Repeat CBC if symptoms arise -Follow up is symptoms of fatigue, dizziness or visible blood present

## 2019-06-16 NOTE — Assessment & Plan Note (Signed)
Improving. Discoloration on the tip of the toes consistent with onychomycosis. -Refill of terbinafine -Follow-up as needed

## 2019-06-16 NOTE — Assessment & Plan Note (Signed)
Well-controlled on oral medications.  Last POCT A1c was 6.1 will recheck in November. -Continue medications -Follow-up in 2 weeks for an A1c

## 2019-06-16 NOTE — Assessment & Plan Note (Addendum)
Needs dental surgery. Current insurance does not cover. Will revisit the insurance issue in January. -Referral to Dentistry

## 2019-06-17 ENCOUNTER — Encounter: Payer: Self-pay | Admitting: Internal Medicine

## 2019-06-17 NOTE — Addendum Note (Signed)
Addended by: Caralee Ates on: 06/17/2019 01:09 PM   Modules accepted: Orders

## 2019-07-01 ENCOUNTER — Ambulatory Visit: Payer: Medicaid Other | Admitting: Pharmacist

## 2019-07-08 ENCOUNTER — Ambulatory Visit (INDEPENDENT_AMBULATORY_CARE_PROVIDER_SITE_OTHER): Payer: Medicaid Other | Admitting: Pharmacist

## 2019-07-08 ENCOUNTER — Encounter: Payer: Self-pay | Admitting: Pharmacist

## 2019-07-08 ENCOUNTER — Other Ambulatory Visit: Payer: Self-pay

## 2019-07-08 DIAGNOSIS — E119 Type 2 diabetes mellitus without complications: Secondary | ICD-10-CM | POA: Diagnosis not present

## 2019-07-08 MED ORDER — OZEMPIC (0.25 OR 0.5 MG/DOSE) 2 MG/1.5ML ~~LOC~~ SOPN: 0.2500 mg | PEN_INJECTOR | SUBCUTANEOUS | 0 refills | Status: DC

## 2019-07-08 NOTE — Progress Notes (Signed)
Reviewed: Agree with Dr. Koval's documentation and management. 

## 2019-07-08 NOTE — Patient Instructions (Addendum)
It was great to see you today!  - Take last dose of Victoza 1.2 mg on 07/08/19, then stop taking this medicine. - Start Ozempic 0.25 mg on 11/25. - Take Ozempic 0.25 mg once every Wednesday for 2 weeks, then on the third week (07/23/19), dial up and take Ozempic [id number] mg + 5 clicks for 3 weeks, then on 08/13/19, increase dose of Ozempic to 0.5 mg weekly. - Continue taking Metformin 2000 mg daily - Increase walking to mail-box 5-7 days a week  Follow up with Dr. Janus Molder in January

## 2019-07-08 NOTE — Progress Notes (Signed)
S:     Chief Complaint  Patient presents with  . Medication Management    Diabetes and Weight Loss Follow Up    Patient arrives in a pleasant mood and ambulating without assistance. Patient does have a noticeable limp with gait. Mr. Gerardot presents for weight loss, diabetes evaluation, education, and management. Patient was referred and last seen by Primary Care Provider, Dr. Janus Molder, on 06/12/19. Patient reports continued difficulty losing weight. Patient's weight has increased 4 lbs since last visit. Mr. Arace reports taking victoza 1.2 mg daily. Patient tried 1.8 mg of victoza in the past but "became sick". Patient expresses concern that metformin may not be working. Patient reports unable to exercise or go to the gym due to [place]. Patient states it has been difficult to walk around neighborhood and "getting jumped". In an effort to lose weight, patient has attempted to start drinking Ketoshakes every other day. Pt reports eating less pasta and increasing more veggie pasta.  Insurance coverage/medication affordability: Medicaid  Patient reports adherence with medications.  Current diabetes medications include: Metformin 2000 mg every morning Current hypertension medications include: Valsartan 160 mg daily, carvedilol 3.125 mg BID Current hyperlipidemia medications include: Atorvastatin 80 mg daily  Patient reports one hypoglycemic event with a blood sugar of 78 mg/dL.  Patient-reported exercise habits: Walks to [employer] every other day. Patient reports difficulty adhering to an exercise regimen due to gym closures and bowling alley closures from COVID-19. Patient is uncomfortable with walking around outside due to living in an unsafe neighborhood.    Patient denies nocturia (nighttime urination).  Patient denies neuropathy (nerve pain). Patient denies visual changes.    O:  Physical Exam Vitals signs reviewed.  Constitutional:      Appearance: He is obese.  Cardiovascular:     Pulses: Normal pulses.  Pulmonary:     Effort: Pulmonary effort is normal.  Psychiatric:        Mood and Affect: Mood normal.        Thought Content: Thought content normal.        Judgment: Judgment normal.      Review of Systems  All other systems reviewed and are negative.    Lab Results  Component Value Date   HGBA1C 6.1 04/11/2019   Vitals:   07/08/19 1006  BP: 132/78  Pulse: 65  SpO2: 98%    Lipid Panel     Component Value Date/Time   CHOL 144 04/11/2019 1506   TRIG 78 04/11/2019 1506   HDL 43 04/11/2019 1506   CHOLHDL 3.3 04/11/2019 1506   CHOLHDL 4.3 06/16/2015 0921   VLDL 13 06/16/2015 0921   LDLCALC 85 04/11/2019 1506   LDLDIRECT 90 07/13/2017 1606   LDLDIRECT 88 02/04/2016 1115    Home fasting blood sugars: 78-110 7-day fasting blood glucose = 101  Clinical Atherosclerotic Cardiovascular Disease (ASCVD): No  The 10-year ASCVD risk score Mikey Bussing DC Jr., et al., 2013) is: 20.8%   Values used to calculate the score:     Age: 58 years     Sex: Male     Is Non-Hispanic African American: Yes     Diabetic: Yes     Tobacco smoker: No     Systolic Blood Pressure: [id number] mmHg     Is BP treated: Yes     HDL Cholesterol: 43 mg/dL     Total Cholesterol: 144 mg/dL    A/P: Diabetes longstanding currently controled. Patient is able to verbalize appropriate hypoglycemia management plan. Patient  is adherent with medication: - Last dose of Victoza 1.2 mg taken on 07/08/19, then stop taking this medicine. - Start Ozempic 0.25 mg on 11/25. - Take Ozempic 0.25 mg once every Wednesday for 2 weeks, then on the third week (07/23/19), dial up and take Ozempic [id number] mg + 5 clicks for 3 weeks, then on 08/13/19, increase dose of Ozempic to 0.5 mg weekly. - Continue taking Metformin 2000 mg (and other maintenance medications) daily. Consider switching to Metformin XR at next refill. Additionally, patient counseled that metformin is likely working as patient has been off of  insulin as a result of this medicine. - Increase walking to mail-box 5-7 days a week - Extensively discussed pathophysiology of diabetes, recommended lifestyle interventions, dietary effects on blood sugar control -Counseled on s/sx of and management of hypoglycemia -Next A1C anticipated January 2021  ASCVD risk - primary prevention in patient with diabetes. Last LDL is controlled. ASCVD risk score is not >20%  - high intensity statin indicated. Aspirin is indicated.  -Continued aspirin 81 mg  -Continued Atorvastatin 80 mg.   Hypertension longstanding currently controlled.  Blood pressure goal = 130/80 mmHg. Patient reports excellent medication adherence  Written patient instructions provided.  Total time in face to face counseling 25 minutes.   Follow up PCP Clinic Visit in January. Patient seen with Donald Prose, PharmD Candidate, Simonne Come, PharmD Candidate and Sherren Kerns, PharmD PGY-1 Resident.

## 2019-07-08 NOTE — Assessment & Plan Note (Signed)
Diabetes longstanding currently controled. Patient is able to verbalize appropriate hypoglycemia management plan. Patient is adherent with medication: - Last dose of Victoza 1.2 mg taken on 07/08/19, then stop taking this medicine. - Start Ozempic 0.25 mg on 11/25. - Take Ozempic 0.25 mg once every Wednesday for 2 weeks, then on the third week (07/23/19), dial up and take Ozempic [id number] mg + 5 clicks for 3 weeks, then on 08/13/19, increase dose of Ozempic to 0.5 mg weekly. - Continue taking Metformin 2000 mg (and other maintenance medications) daily. Consider switching to Metformin XR at next refill. Additionally, patient counseled that metformin is likely working as patient has been off of insulin as a result of this medicine. - Increase walking to mail-box 5-7 days a week - Extensively discussed pathophysiology of diabetes, recommended lifestyle interventions, dietary effects on blood sugar control -Counseled on s/sx of and management of hypoglycemia -Next A1C anticipated January 2021

## 2019-07-14 ENCOUNTER — Other Ambulatory Visit: Payer: Self-pay | Admitting: Family Medicine

## 2019-07-14 DIAGNOSIS — I10 Essential (primary) hypertension: Secondary | ICD-10-CM

## 2019-07-22 ENCOUNTER — Ambulatory Visit: Payer: Medicaid Other | Admitting: Internal Medicine

## 2019-08-29 ENCOUNTER — Ambulatory Visit: Payer: Medicaid Other | Admitting: Internal Medicine

## 2019-09-08 ENCOUNTER — Telehealth: Payer: Self-pay | Admitting: *Deleted

## 2019-09-08 NOTE — Telephone Encounter (Signed)
Pt wants to let Dr. Valentina Lucks know that he stopped taking the ozempic and went back to victoza because the ozempic made him sick.  Appt made for 09/21/18 with Dr. Valentina Lucks. Christen Bame, CMA

## 2019-09-22 ENCOUNTER — Ambulatory Visit: Payer: Medicaid Other | Admitting: Pharmacist

## 2019-09-24 NOTE — Telephone Encounter (Signed)
Pt no showed appt on 09/22/19.  Christen Bame, CMA

## 2019-10-02 ENCOUNTER — Ambulatory Visit: Payer: Medicaid Other | Admitting: Family Medicine

## 2019-10-06 ENCOUNTER — Other Ambulatory Visit: Payer: Self-pay | Admitting: Family Medicine

## 2019-10-06 DIAGNOSIS — E1169 Type 2 diabetes mellitus with other specified complication: Secondary | ICD-10-CM

## 2019-10-06 DIAGNOSIS — E785 Hyperlipidemia, unspecified: Secondary | ICD-10-CM

## 2019-10-15 ENCOUNTER — Other Ambulatory Visit: Payer: Self-pay

## 2019-10-15 ENCOUNTER — Ambulatory Visit (INDEPENDENT_AMBULATORY_CARE_PROVIDER_SITE_OTHER): Payer: Medicaid Other | Admitting: Family Medicine

## 2019-10-15 ENCOUNTER — Encounter: Payer: Self-pay | Admitting: Family Medicine

## 2019-10-15 ENCOUNTER — Other Ambulatory Visit: Payer: Self-pay | Admitting: Family Medicine

## 2019-10-15 VITALS — BP 138/82 | HR 69 | Wt 389.8 lb

## 2019-10-15 DIAGNOSIS — Z6841 Body Mass Index (BMI) 40.0 and over, adult: Secondary | ICD-10-CM | POA: Diagnosis not present

## 2019-10-15 DIAGNOSIS — I10 Essential (primary) hypertension: Secondary | ICD-10-CM

## 2019-10-15 DIAGNOSIS — E119 Type 2 diabetes mellitus without complications: Secondary | ICD-10-CM | POA: Diagnosis not present

## 2019-10-15 LAB — POCT GLYCOSYLATED HEMOGLOBIN (HGB A1C): HbA1c, POC (controlled diabetic range): 5.6 % (ref 0.0–7.0)

## 2019-10-15 MED ORDER — LIRAGLUTIDE 18 MG/3ML ~~LOC~~ SOPN: 0.6000 mg | PEN_INJECTOR | Freq: Every day | SUBCUTANEOUS | 3 refills | Status: DC

## 2019-10-15 MED ORDER — CARVEDILOL 3.125 MG PO TABS: 3.1250 mg | ORAL_TABLET | Freq: Two times a day (BID) | ORAL | 11 refills | Status: DC

## 2019-10-15 NOTE — Assessment & Plan Note (Signed)
Well controlled. A1c 5.6, decreased from 6 months ago (6.1). No recent hypoglycemic episodes. CBGs 81-138. Recent change of ozempic back to victoza due to intolerance. GLP-1 angonist use is mainly for weight loss. Curbside consult with Dr. Valentina Lucks recommended continue with victoza 1.2mg  increasing to a max 1.8 mg with 1 click each week as tolerated continues to be safe for his A1c range. Instructed the patient to call with any symptoms of nausea or vomiting. -Continue to monitor CBGs -Continue metformin  -Continue vicotza 1.2mg  titrating up weekly to 1.8mg  -Follow up in 6 months or sooner if needed

## 2019-10-15 NOTE — Patient Instructions (Addendum)
It was very nice to meet you today. Please enjoy the rest of your week. Continue Victoza 1.2mg  increasing by 1 click per week to 1.8mg . If you become nauseous or have vomiting please call us.   Please get a diabetic eye exam and have the eye doctor fax to our office.   Please call the clinic at [phone] if your symptoms worsen or you have any concerns. It was our pleasure to serve you.

## 2019-10-15 NOTE — Assessment & Plan Note (Addendum)
Down 11 lbs in 5 months since using GLP-1 agonists. Continues physical activity such as bowling. Is now taking a supplement (goli gummies) for appetite suppression as well. Did not tolerate ozempic well but has since tolerated victoza 1.2mg .  -Continue physical activities such as gym exercises and bowling these are healthy habits -Continue Victoza 1.2mg  and increase by 1 click each week to a maximum of 1.8mg  or a tolerated -Please call if nausea or vomiting occurs or any symptoms of hypoglycemia -Return in 6 months of soon if needed.

## 2019-10-15 NOTE — Progress Notes (Signed)
    SUBJECTIVE:   CHIEF COMPLAINT / HPI:   Craig [name] is 59 yo male who is following up today for diabetes and weight loss.  He was seen by Dr. Valentina Lucks 07/08/2019 and was started on ozempic 0.5mg . He  became sick with vomiting after increasing the dose by the 3rd week and subsequently stopped the medication and returned back to taking Victoza 1.2mg  (0.6mg  in the morning and an additional 0.6mg  around 3pm). His blood sugars range from 81 to 138.  He has lost 11 lbs in 5 months and is very happy with this and did not realize he was 400 lbs before. He is using goli gummies 3 times a day and it is suppressing his appetite and gives him energy. He continues to enjoy bowling as a physical activity.   He does not endorse symptoms of hypoglycemia such as sweating, shakiness, or lightheadedness. He does endorse feeling like this if his blood sugar drops into the 70s but has not done so since our last visit. No paresthesias, polyuria, polydipsia, or polyphagia.   He will consider the coronavirus shot when the [employer] version is available to him.  PERTINENT  PMH / PSH: Morbid obesity, T2DM, HTN, HLD, h/o gastric cancer  OBJECTIVE:   BP 138/82   Pulse 69   Wt (!) 389 lb 12.8 oz (176.8 kg)   SpO2 100%   BMI 57.56 kg/m    General: Appears well, no acute distress. Age appropriate. Morbidly obese. Cardiac: RRR, normal heart sounds, no murmurs. Peripheral vascular arteries palpable. Respiratory: CTAB, normal effort Extremities: mild non pitting LE edema  ASSESSMENT/PLAN:   Controlled type 2 diabetes mellitus without complication, without long-term current use of insulin ([place]) Well controlled. A1c 5.6, decreased from 6 months ago (6.1). No recent hypoglycemic episodes. CBGs 81-138. Recent change of ozempic back to victoza due to intolerance. GLP-1 angonist use is mainly for weight loss. Curbside consult with Dr. Valentina Lucks recommended continue with victoza 1.2mg  increasing to a max 1.8 mg with 1  click each week as tolerated continues to be safe for his A1c range. Instructed the patient to call with any symptoms of nausea or vomiting. -Continue to monitor CBGs -Continue metformin  -Continue vicotza 1.2mg  titrating up weekly to 1.8mg  -Follow up in 6 months or sooner if needed  Morbid obesity with BMI of 50.0-59.9, adult (HCC) Down 11 lbs in 5 months since using GLP-1 agonists. Continues physical activity such as bowling. Is now taking a supplement (goli gummies) for appetite suppression as well. Did not tolerate ozempic well but has since tolerated victoza 1.2mg .  -Continue physical activities such as gym exercises and bowling these are healthy habits -Continue Victoza 1.2mg  and increase by 1 click each week to a maximum of 1.8mg  or a tolerated -Please call if nausea or vomiting occurs or any symptoms of hypoglycemia -Return in 6 months of soon if needed.      Gerlene Fee, [place]

## 2019-10-27 ENCOUNTER — Encounter: Payer: Self-pay | Admitting: Pharmacist

## 2019-10-27 ENCOUNTER — Other Ambulatory Visit: Payer: Self-pay

## 2019-10-27 ENCOUNTER — Ambulatory Visit (INDEPENDENT_AMBULATORY_CARE_PROVIDER_SITE_OTHER): Payer: Medicaid Other | Admitting: Pharmacist

## 2019-10-27 DIAGNOSIS — E119 Type 2 diabetes mellitus without complications: Secondary | ICD-10-CM | POA: Diagnosis not present

## 2019-10-27 NOTE — Progress Notes (Signed)
S:     Chief Complaint  Patient presents with  . Medication Management    [place] - Obesity    Patient arrives in good spirit, ambulating without assistance.  He states that is his knees are in pain 9/10 and he has taken ibuprofen.  Presents for diabetes evaluation, education, and management Patient was referred and last seen by Primary Care Provider, Dr. Janus Molder on 10/15/2019.    Patient reports Diabetes was diagnosed in 2014.   Family/Social History: unknown  Insurance coverage/medication affordability: Medicaid  Patient reports adherence with medications.  Current diabetes medications include: metformin 1000mg  BID, Victoza 1.2 mg plus 2 clicks daily Current hypertension medications include: carvedilol 3.125 mg BID, valsartan 160 mg daily Current hyperlipidemia medications include: Atorvastatin 80 mg  Patient denies hypoglycemic events.  Patient-reported exercise habits: continues with bowling/walking   Patient denies neuropathy (nerve pain). Controlled with gabapentin 600 mg BID.    O:  Physical Exam Vitals reviewed.  Constitutional:      Appearance: He is obese.  Pulmonary:     Effort: Pulmonary effort is normal.  Musculoskeletal:     Right lower leg: Edema present.     Left lower leg: Edema present.     Comments: 1+ pitting edema  Neurological:     Mental Status: He is alert.  Psychiatric:        Mood and Affect: Mood normal.        Thought Content: Thought content normal.      Review of Systems  Musculoskeletal: Positive for joint pain.       Knee pain due to walking.     Lab Results  Component Value Date   HGBA1C 5.6 10/15/2019   There were no vitals filed for this visit.  Lipid Panel     Component Value Date/Time   CHOL 144 04/11/2019 1506   TRIG 78 04/11/2019 1506   HDL 43 04/11/2019 1506   CHOLHDL 3.3 04/11/2019 1506   CHOLHDL 4.3 06/16/2015 0921   VLDL 13 06/16/2015 0921   LDLCALC 85 04/11/2019 1506   LDLDIRECT 90 07/13/2017  1606   LDLDIRECT 88 02/04/2016 1115   CBG: 7 day average - 122 30 day average - 111  Clinical Atherosclerotic Cardiovascular Disease (ASCVD): No  The 10-year ASCVD risk score Mikey Bussing DC Jr., et al., 2013) is: 22.4%   Values used to calculate the score:     Age: 28 years     Sex: Male     Is Non-Hispanic African American: Yes     Diabetic: Yes     Tobacco smoker: No     Systolic Blood Pressure: 0000000 mmHg     Is BP treated: Yes     HDL Cholesterol: 43 mg/dL     Total Cholesterol: 144 mg/dL    A/P: Diabetes longstanding currently well controlled.  Patient is able to verbalize appropriate hypoglycemia management plan. Patient is adherent with medication.  -Continued GLP-1 Victoza (liraglutide) at 1.2 mg plus 2 clicks daily, increasing by 1 click every Monday. Max dose 1.8 mg a day. -Continued metformin 1000 mg twice daily with meals. -Extensively discussed pathophysiology of diabetes, recommended lifestyle interventions, dietary effects on blood sugar control -Counseled on s/sx of and management of hypoglycemia -Next A1C anticipated 3-4 months.   ASCVD risk - primary prevention in patient with diabetes. Last LDL is controlled. ASCVD risk score is >20%. -Continued aspirin 81 mg  -Continued atorvastatin 80 mg.   Hypertension longstanding currently controlled.  Blood pressure goal =  130/80 mmHg. Patient medication adherence good.  -Continued metoprolol 3.125 mg BID -Continue valsartan 160 mg daily  Written patient instructions provided.  Total time in face to face counseling 30 minutes.   Follow up Pharmacist/PCP in 3-4 months.   Patient seen with Marin Roberts, PharmD PGY-1 Resident.

## 2019-10-27 NOTE — Assessment & Plan Note (Signed)
Diabetes longstanding currently well controlled.  Patient is able to verbalize appropriate hypoglycemia management plan. Patient is adherent with medication.  -Continued GLP-1 Victoza (liraglutide) at 1.2 mg plus 2 clicks daily, increasing by 1 click every Monday. Max dose 1.8 mg a day. -Continued metformin 1000 mg twice daily with meals. -Extensively discussed pathophysiology of diabetes, recommended lifestyle interventions, dietary effects on blood sugar control -Counseled on s/sx of and management of hypoglycemia -Next A1C anticipated 3-4 months.

## 2019-10-27 NOTE — Progress Notes (Signed)
Reviewed: I agree with Dr. Koval's documentation and management. 

## 2019-10-27 NOTE — Patient Instructions (Signed)
Great to see you today.   Keep up the great work on being active.   NO changes in medications today.  Continue Victoza dose increase by 1 click per day up to 1.8mg .   Next visit with Dr. Janus Molder  3-4 months.

## 2019-10-28 ENCOUNTER — Telehealth: Payer: Self-pay | Admitting: Family Medicine

## 2019-10-28 NOTE — Telephone Encounter (Signed)
Spoke with Craig [name] today about SCAT form he dropped off during his visit yesterday with Dr. Valentina Lucks. I explained to him that I have not been treating him for a medical disability that requires SCAT or that he would not be able to adhere to the regular bus schedule. He said that he has been using SCAT for years because he cannot stand for long periods of time or walk far distances. I explained to him that I was not aware of this but will look into it further but will be out of the office until next Wednesday 3/24. He stated the form needed to be in by 3/30 and voiced understanding.  Craig Rao Autry-Lott, DO

## 2019-11-05 ENCOUNTER — Telehealth: Payer: Self-pay | Admitting: Family Medicine

## 2019-11-05 NOTE — Telephone Encounter (Signed)
Pt. Called informed that SCAT form is in front office ready for pick up at his earliest convenience.   Dr. Janus Molder, DO

## 2019-11-10 ENCOUNTER — Ambulatory Visit (INDEPENDENT_AMBULATORY_CARE_PROVIDER_SITE_OTHER): Payer: Medicaid Other | Admitting: Family Medicine

## 2019-11-10 ENCOUNTER — Other Ambulatory Visit: Payer: Self-pay

## 2019-11-10 VITALS — BP 100/72 | HR 73 | Ht 69.7 in | Wt 360.0 lb

## 2019-11-10 DIAGNOSIS — I951 Orthostatic hypotension: Secondary | ICD-10-CM

## 2019-11-10 HISTORY — DX: Orthostatic hypotension: I95.1

## 2019-11-10 MED ORDER — ONDANSETRON 4 MG PO TBDP: 4.0000 mg | ORAL_TABLET | Freq: Three times a day (TID) | ORAL | 0 refills | Status: DC | PRN

## 2019-11-10 NOTE — Progress Notes (Signed)
    SUBJECTIVE:   CHIEF COMPLAINT / HPI:   Dizziness, presyncope Friday, was doing errands and noticed that his vision was blurry, also felt lightheaded.  He checked his sugar then, and it was normal.  Had off and on dizziness during the weekend.  Has not eaten much in the past few days and over the last few weeks.  He has been drinking water and ginger ale.  Has been having nausea and vomiting over the weekend.  Symptoms are worse on standing.  He has been taking Victoza chronically and recently increased the dose.  Has been urinating like normal.  He has had similar symptoms with Ozempic and increased doses Victoza in the past.  He denies chest pain and palpitations.  Also denies shortness of breath.  PERTINENT  PMH / PSH: Type 2 diabetes, anxiety and depression, history of gastric cancer  OBJECTIVE:   BP 100/72   Pulse 73   Ht 5' 9.7" (1.77 m)   Wt (!) 360 lb (163.3 kg)   SpO2 95%   BMI 52.10 kg/m   General: NAD, morbidly obese Cardiac: RRR, no MRG Respiratory: CTAB, no rhonchi, rales, or wheezing, normal work of breathing Skin: no rashes or other lesions, warm and well perfused Psych: appropriate mood and affect  Orthostatic VS for the past 24 hrs:  BP- Lying Pulse- Lying BP- Sitting Pulse- Sitting BP- Standing at 0 minutes Pulse- Standing at 0 minutes  11/10/19 1007 138/81 78 126/68 74 110/69 118     ASSESSMENT/PLAN:   Orthostatic hypotension Positive orthostatic vital signs, likely due to low volume status from nausea, vomiting, and poor appetite and concurrent antihypertensive medications.  He has lost 40 lb since October 2020, which is about 10% of his body weight.  We will stop Victoza, carvedilol, and valsartan for the next week and follow-up with him at that time.  He has failed two GLP-1 agonists, so he may be a candidate instead for an SGLT2 inhibitor, which could also help him with weight loss but hopefully cause fewer GI side effects.  Counseled him on staying  well-hydrated and eating foods containing salt over the next week.   He will use a cab to get home and will not drive today.     Kathrene Alu, MD [place]

## 2019-11-10 NOTE — Assessment & Plan Note (Addendum)
Positive orthostatic vital signs, likely due to low volume status from nausea, vomiting, and poor appetite and concurrent antihypertensive medications.  He has lost 40 lb since October 2020, which is about 10% of his body weight.  We will stop Victoza, carvedilol, and valsartan for the next week and follow-up with him at that time.  He has failed two GLP-1 agonists, so he may be a candidate instead for an SGLT2 inhibitor, which could also help him with weight loss but hopefully cause fewer GI side effects.  Counseled him on staying well-hydrated and eating foods containing salt over the next week.   He will use a cab to get home and will not drive today.

## 2019-11-10 NOTE — Patient Instructions (Signed)
It was nice meeting you today Craig [name]!  Please stop the Victoza, carvedilol, and valsartan for the next week.  Please also work on staying well-hydrated and nibbling salty foods throughout the day.  I am sending a nausea medication called Zofran to your pharmacy today.  Please do not drive and rest over the next few days.  Please follow-up with me in about 1 week or sooner if needed.  If you have any questions or concerns, please feel free to call the clinic.   Be well,  Dr. Shan Levans

## 2019-11-14 ENCOUNTER — Other Ambulatory Visit: Payer: Self-pay | Admitting: Family Medicine

## 2019-11-14 DIAGNOSIS — R202 Paresthesia of skin: Secondary | ICD-10-CM

## 2019-11-14 DIAGNOSIS — G5603 Carpal tunnel syndrome, bilateral upper limbs: Secondary | ICD-10-CM

## 2019-11-20 ENCOUNTER — Encounter: Payer: Self-pay | Admitting: Family Medicine

## 2019-11-20 ENCOUNTER — Other Ambulatory Visit: Payer: Self-pay

## 2019-11-20 ENCOUNTER — Ambulatory Visit (INDEPENDENT_AMBULATORY_CARE_PROVIDER_SITE_OTHER): Payer: Medicaid Other | Admitting: Family Medicine

## 2019-11-20 VITALS — BP 102/74 | HR 80 | Ht 70.0 in | Wt 372.2 lb

## 2019-11-20 DIAGNOSIS — Z85028 Personal history of other malignant neoplasm of stomach: Secondary | ICD-10-CM

## 2019-11-20 DIAGNOSIS — E119 Type 2 diabetes mellitus without complications: Secondary | ICD-10-CM

## 2019-11-20 DIAGNOSIS — R42 Dizziness and giddiness: Secondary | ICD-10-CM

## 2019-11-20 DIAGNOSIS — I951 Orthostatic hypotension: Secondary | ICD-10-CM

## 2019-11-20 LAB — POCT UA - MICROALBUMIN
Creatinine, POC: 300 mg/dL
Microalbumin Ur, POC: 150 mg/L

## 2019-11-20 MED ORDER — ACCU-CHEK SOFTCLIX LANCETS MISC: 6 refills | Status: DC

## 2019-11-20 NOTE — Assessment & Plan Note (Signed)
Due to this history, ongoing anemia, and overdue colonoscopy, we are referring him to ambulatory GI.

## 2019-11-20 NOTE — Assessment & Plan Note (Signed)
Orthostatics today: Laying 122/68 pulse 69, sitting 128/76 pulse 66, Standing 140/84 pulse 99, after 3 min 151/81 pulse 102. Though this does not fit the criteria for orthostatic hypotension, his measurement support the possibility. We want him to stay off the Victoza, Valsartan, and carvedilol. Additionally, he was advised to stop the Gabapentin as dizziness is a potential side effect. He has a history of anemia so we will check CBC, TSH, complete metabolic panel.

## 2019-11-20 NOTE — Progress Notes (Addendum)
    SUBJECTIVE:   CHIEF COMPLAINT / HPI:   Dizziness Craig [name] has a past medical history of dizziness, orthostatic hypotension, T2DM, and anemia who presents today with continued dizziness since his last visit on 11/10/2019. He endorses significant dizziness when he first wakes up and this has been a frequent occurrence since his last visit, though it improved slightly yesterday. He states that the dizziness feels like lightheadedness and "numbness in his head" that worsens with abrupt standing. Additionally he starts to feel worse when blood glucose is below 95, but sugars have stayed above that recently. Ondansetron and stopping Victoza helped with the nausea and vomiting. His appetite has also improved since stopping the victoza.  He stopped valsartan and carvedilol as instructed since his last visit.  He says that he has had gastric cancer in the past, but they "lost his records."  According to his chart, it appears that he received care in [place].  He had meant to get a colonoscopy last year but did not due to the Covid pandemic.   PERTINENT  PMH / PSH: Orthostatic hypotension, primary hypertension, T2DM, morbid obesity, anemia   OBJECTIVE:   BP 102/74   Pulse 80   Ht 5\' 10"  (1.778 m)   Wt (!) 372 lb 3.2 oz (168.8 kg)   SpO2 98%   BMI 53.41 kg/m   Physical Exam  Constitutional: He is well-developed, well-nourished, and in no distress.  Cardiovascular: Normal rate, regular rhythm and normal heart sounds.  Pulmonary/Chest: Effort normal and breath sounds normal.  Skin: He is not diaphoretic.     ASSESSMENT/PLAN:   Dizziness Orthostatics today: Laying 122/68 pulse 69, sitting 128/76 pulse 66, Standing 140/84 pulse 99, after 3 min 151/81 pulse 102. Though this does not fit the criteria for orthostatic hypotension, his measurement support the possibility. We want him to stay off the Victoza, Valsartan, and carvedilol. Additionally, he was advised to stop the Gabapentin as  dizziness is a potential side effect. He has a history of anemia so we will check CBC, TSH, complete metabolic panel.  History of gastric cancer Due to this history, ongoing anemia, and overdue colonoscopy, we are referring him to ambulatory GI.  Controlled type 2 diabetes mellitus without complication, without long-term current use of insulin (HCC) Checking urine microalbumin today as he is overdue  Follow up: 2 weeks   Malcom I have separately seen and examined the patient. I have discussed the findings and exam with the medical student and agree with the above note. I helped develop the management plan that is described in the student's note, and I agree with the content.  Amanda C. Shan Levans, MD PGY-3, [facility] 11/21/2019 8:53 AM

## 2019-11-20 NOTE — Assessment & Plan Note (Signed)
Checking urine microalbumin today as he is overdue

## 2019-11-20 NOTE — Patient Instructions (Signed)
It was nice seeing you today [name]!  Today, we are checking some labs for possible causes of your dizziness.  I would also like for you to stop gabapentin.  Please also continue to hold off on taking valsartan, carvedilol, and Victoza.  I do not feel that you need another diabetes medication at this time.  You are due for your colonoscopy, and with your history of gastric cancer, you would benefit from referral to GI.  If you have any questions or concerns, please feel free to call the clinic.   Be well,  Dr. Shan Levans

## 2019-11-21 ENCOUNTER — Telehealth: Payer: Self-pay

## 2019-11-21 LAB — COMPREHENSIVE METABOLIC PANEL
ALT: 38 IU/L (ref 0–44)
AST: 30 IU/L (ref 0–40)
Albumin/Globulin Ratio: 0.9 — ABNORMAL LOW (ref 1.2–2.2)
Albumin: 3.6 g/dL — ABNORMAL LOW (ref 3.8–4.9)
Alkaline Phosphatase: 89 IU/L (ref 39–117)
BUN/Creatinine Ratio: 9 (ref 9–20)
BUN: 11 mg/dL (ref 6–24)
Bilirubin Total: 0.2 mg/dL (ref 0.0–1.2)
CO2: 23 mmol/L (ref 20–29)
Calcium: 9.4 mg/dL (ref 8.7–10.2)
Chloride: 100 mmol/L (ref 96–106)
Creatinine, Ser: 1.21 mg/dL (ref 0.76–1.27)
GFR calc Af Amer: 77 mL/min/{1.73_m2} (ref >59)
GFR calc non Af Amer: 67 mL/min/{1.73_m2} (ref >59)
Globulin, Total: 3.8 g/dL (ref 1.5–4.5)
Glucose: 80 mg/dL (ref 65–99)
Potassium: 5 mmol/L (ref 3.5–5.2)
Sodium: 139 mmol/L (ref 134–144)
Total Protein: 7.4 g/dL (ref 6.0–8.5)

## 2019-11-21 LAB — CBC
Hematocrit: 37.2 % — ABNORMAL LOW (ref 37.5–51.0)
Hemoglobin: 11.8 g/dL — ABNORMAL LOW (ref 13.0–17.7)
MCH: 27.8 pg (ref 26.6–33.0)
MCHC: 31.7 g/dL (ref 31.5–35.7)
MCV: 88 fL (ref 79–97)
Platelets: 336 10*3/uL (ref 150–450)
RBC: 4.25 x10E6/uL (ref 4.14–5.80)
RDW: 13.8 % (ref 11.6–15.4)
WBC: 7.4 10*3/uL (ref 3.4–10.8)

## 2019-11-21 LAB — TSH: TSH: 0.677 u[IU]/mL (ref 0.450–4.500)

## 2019-11-21 NOTE — Progress Notes (Signed)
Left voice message with results.  Told patient that he continues to be slightly anemic, which is more reason for him to see the GI doctor.  I also told him that he has mild protein in his urine, which is likely due to his diabetes.  Medications like the valsartan he was taking can helped prevent worsening of this, but we are currently holding it because of his hypotension.  However, this will stay on his record, and if his blood pressure normalizes, we can consider restarting valsartan.  I encouraged him to call if he has any questions.

## 2019-11-21 NOTE — Telephone Encounter (Signed)
We discussed his dizziness at length yesterday, and I do not expect it to resolve quickly.  I will continue to think about possible causes and what we can do to work this up, but the best next step currently is to follow-up with GI to see if there is a gastrointestinal source of his anemia.

## 2019-11-21 NOTE — Telephone Encounter (Signed)
"  Left voice message with results. Told patient that he continues to be slightly anemic, which is more reason for him to see the GI doctor. I also told him that he has mild protein in his urine, which is likely due to his diabetes. Medications like the valsartan he was taking can helped prevent worsening of this, but we are currently holding it because of his hypotension. However, this will stay on his record, and if his blood pressure normalizes, we can consider restarting valsartan. I encouraged him to call if he has any questions." -Winfrey  Patient returns call to nurse line. I advised him of above lab results and that you left them on his VM. Patient stated he is still dizzy when he gets up and would like to discuss with provider.

## 2019-11-24 ENCOUNTER — Other Ambulatory Visit: Payer: Self-pay | Admitting: Family Medicine

## 2019-11-24 ENCOUNTER — Telehealth: Payer: Self-pay | Admitting: Family Medicine

## 2019-11-24 DIAGNOSIS — I951 Orthostatic hypotension: Secondary | ICD-10-CM

## 2019-11-24 NOTE — Telephone Encounter (Signed)
Patient calls to let Dr. Shan Levans know he only has his dizziness from the time he gets up in the morning until about 10 o'clock now that some of his meds have been taken away.   Patient has upcoming appt with [facility] on April 21.  Routing to [place], who saw patient for this.

## 2019-11-24 NOTE — Telephone Encounter (Signed)
I left a voice message on the patient's phone explaining that I would like for cardiology to evaluate him because he continues to have symptoms of orthostatic hypotension after our medication changes.  He may have POTS or another cardiovascular source of his symptoms.  He may also have orthostatic hypotension as a sequela of diabetes.  I would also like to see him in the clinic in the next couple of weeks to do an EKG before he sees cardiology if possible.  I am placing the referral today.  I encouraged him to call if he has any questions.

## 2019-12-03 ENCOUNTER — Encounter: Payer: Self-pay | Admitting: Family Medicine

## 2019-12-03 ENCOUNTER — Other Ambulatory Visit: Payer: Self-pay

## 2019-12-03 ENCOUNTER — Ambulatory Visit (INDEPENDENT_AMBULATORY_CARE_PROVIDER_SITE_OTHER): Payer: Medicaid Other | Admitting: Family Medicine

## 2019-12-03 VITALS — BP 120/84 | HR 84 | Ht 70.0 in | Wt 391.2 lb

## 2019-12-03 DIAGNOSIS — I951 Orthostatic hypotension: Secondary | ICD-10-CM | POA: Diagnosis not present

## 2019-12-03 DIAGNOSIS — I10 Essential (primary) hypertension: Secondary | ICD-10-CM

## 2019-12-03 DIAGNOSIS — Z6841 Body Mass Index (BMI) 40.0 and over, adult: Secondary | ICD-10-CM

## 2019-12-03 NOTE — Patient Instructions (Addendum)
It was nice seeing you today Craig [name]!  I am sending you to a nutritionist.  If you do not hear a call from their office in the next few weeks, please call me.  Please also reduce your food portions to about the size of your fist and include vegetables and every meal.  Avoid fried foods and avoid drinking anything with calories.  Please follow the recommendations below as well.  I am glad that your dizziness is better.  We will not make any medication changes today.  Please follow-up with cardiology and gastroenterology.  If you have any questions or concerns, please feel free to call the clinic.   Be well,  Dr. Shan Levans  Mediterranean Diet A Mediterranean diet refers to food and lifestyle choices that are based on the traditions of countries located on the [place]. This way of eating has been shown to help prevent certain conditions and improve outcomes for people who have chronic diseases, like kidney disease and heart disease. What are tips for following this plan? Lifestyle  Cook and eat meals together with your family, when possible.  Drink enough fluid to keep your urine clear or pale yellow.  Be physically active every day. This includes: ? Aerobic exercise like running or swimming. ? Leisure activities like gardening, walking, or housework.  Get 7-8 hours of sleep each night.  If recommended by your health care provider, drink red wine in moderation. This means 1 glass a day for nonpregnant women and 2 glasses a day for men. A glass of wine equals 5 oz (150 mL). Reading food labels   Check the serving size of packaged foods. For foods such as rice and pasta, the serving size refers to the amount of cooked product, not dry.  Check the total fat in packaged foods. Avoid foods that have saturated fat or trans fats.  Check the ingredients list for added sugars, such as corn syrup. Shopping  At the grocery store, buy most of your food from the areas near the walls of  the store. This includes: ? Fresh fruits and vegetables (produce). ? Grains, beans, nuts, and seeds. Some of these may be available in unpackaged forms or large amounts (in bulk). ? Fresh seafood. ? Poultry and eggs. ? Low-fat dairy products.  Buy whole ingredients instead of prepackaged foods.  Buy fresh fruits and vegetables in-season from local farmers markets.  Buy frozen fruits and vegetables in resealable bags.  If you do not have access to quality fresh seafood, buy precooked frozen shrimp or canned fish, such as tuna, salmon, or sardines.  Buy small amounts of raw or cooked vegetables, salads, or olives from the deli or salad bar at your store.  Stock your pantry so you always have certain foods on hand, such as olive oil, canned tuna, canned tomatoes, rice, pasta, and beans. Cooking  Cook foods with extra-virgin olive oil instead of using butter or other vegetable oils.  Have meat as a side dish, and have vegetables or grains as your main dish. This means having meat in small portions or adding small amounts of meat to foods like pasta or stew.  Use beans or vegetables instead of meat in common dishes like chili or lasagna.  Experiment with different cooking methods. Try roasting or broiling vegetables instead of steaming or sauteing them.  Add frozen vegetables to soups, stews, pasta, or rice.  Add nuts or seeds for added healthy fat at each meal. You can add these to yogurt,  salads, or vegetable dishes.  Marinate fish or vegetables using olive oil, lemon juice, garlic, and fresh herbs. Meal planning   Plan to eat 1 vegetarian meal one day each week. Try to work up to 2 vegetarian meals, if possible.  Eat seafood 2 or more times a week.  Have healthy snacks readily available, such as: ? Vegetable sticks with hummus. ? [place] yogurt. ? Fruit and nut trail mix.  Eat balanced meals throughout the week. This includes: ? Fruit: 2-3 servings a day ? Vegetables: 4-5  servings a day ? Low-fat dairy: 2 servings a day ? Fish, poultry, or lean meat: 1 serving a day ? Beans and legumes: 2 or more servings a week ? Nuts and seeds: 1-2 servings a day ? Whole grains: 6-8 servings a day ? Extra-virgin olive oil: 3-4 servings a day  Limit red meat and sweets to only a few servings a month What are my food choices?  Mediterranean diet ? Recommended  Grains: Whole-grain pasta. Brown rice. Bulgar wheat. Polenta. Couscous. Whole-wheat bread. [place].  Vegetables: Artichokes. Beets. Broccoli. Cabbage. Carrots. Eggplant. Green beans. Chard. Kale. Spinach. Onions. Leeks. Peas. Squash. Tomatoes. Peppers. Radishes.  Fruits: Apples. Apricots. Avocado. Berries. Bananas. Cherries. Dates. Figs. Grapes. Lemons. Melon. Oranges. Peaches. Plums. Pomegranate.  Meats and other protein foods: Beans. Almonds. Sunflower seeds. Pine nuts. Peanuts. [place]. Salmon. Scallops. Shrimp. [place]. Tilapia. Clams. Oysters. Eggs.  Dairy: Low-fat milk. Cheese. Greek yogurt.  Beverages: Water. Red wine. Herbal tea.  Fats and oils: Extra virgin olive oil. Avocado oil. Grape seed oil.  Sweets and desserts: [place] yogurt with honey. Baked apples. Poached pears. Trail mix.  Seasoning and other foods: Basil. Cilantro. Coriander. Cumin. Mint. Parsley. Sage. Rosemary. Tarragon. Garlic. Oregano. Thyme. Pepper. Balsalmic vinegar. Tahini. Hummus. Tomato sauce. Olives. Mushrooms. ? Limit these  Grains: Prepackaged pasta or rice dishes. Prepackaged cereal with added sugar.  Vegetables: Deep fried potatoes (french fries).  Fruits: Fruit canned in syrup.  Meats and other protein foods: Beef. Pork. Lamb. Poultry with skin. Hot dogs. [place] Salines.  Dairy: Ice cream. Sour cream. Whole milk.  Beverages: Juice. Sugar-sweetened soft drinks. Beer. Liquor and spirits.  Fats and oils: Butter. Canola oil. Vegetable oil. Beef fat (tallow). Lard.  Sweets and desserts: Cookies. Cakes. Pies.  Candy.  Seasoning and other foods: Mayonnaise. Premade sauces and marinades. The items listed may not be a complete list. Talk with your dietitian about what dietary choices are right for you. Summary  The Mediterranean diet includes both food and lifestyle choices.  Eat a variety of fresh fruits and vegetables, beans, nuts, seeds, and whole grains.  Limit the amount of red meat and sweets that you eat.  Talk with your health care provider about whether it is safe for you to drink red wine in moderation. This means 1 glass a day for nonpregnant women and 2 glasses a day for men. A glass of wine equals 5 oz (150 mL). This information is not intended to replace advice given to you by your health care provider. Make sure you discuss any questions you have with your health care provider. Document Revised: 03/30/2016 Document Reviewed: 03/23/2016 Elsevier Patient Education  [place].

## 2019-12-03 NOTE — Progress Notes (Signed)
    SUBJECTIVE:   CHIEF COMPLAINT / HPI:   Follow up orthostatic hypotension Craig [name] reports that he is having fewer dizzy spells, but he feels frustrated because he has gained 20 pounds in the last 2 weeks.  He has gone back to eating red meat and bigger portions and says that he is doing this because I told him to "go back to eating the way he was."  He has been checking his blood sugars a few times per day, and he has had one low sugar at 67 which was symptomatic.  He has not been checking his blood pressure at home.  He has continued to abstain from taking carvedilol, valsartan, Victoza, and gabapentin.  He does have a follow-up appointment with cardiology on May 17.  PERTINENT  PMH / PSH: Morbid obesity, gastric cancer, well controlled type 2 diabetes, hypertension  OBJECTIVE:   BP 120/84   Pulse 84   Ht 5\' 10"  (1.778 m)   Wt (!) 391 lb 3.2 oz (177.4 kg)   SpO2 99%   BMI 56.13 kg/m   General: well appearing, appears stated age, morbidly obese Cardiac: RRR, no MRG Respiratory: CTAB, no rhonchi, rales, or wheezing, normal work of breathing Skin: no rashes or other lesions, warm and well perfused Psych: appropriate mood and affect   ASSESSMENT/PLAN:   Orthostatic hypotension Improving due to medication changes.  Encouraged him to keep his follow-up with cardiology since he is still experiencing some symptoms of orthostasis and is at high risk for heart disease given his diabetes, hypertension, and morbid obesity.  Primary hypertension Well-controlled today even without blood pressure medications.  We will continue to monitor.  Morbid obesity with BMI of 50.0-59.9, adult ([place]) Counseled patient that he needs to continue to watch his portion sizes and to eat mostly fruits and vegetables with whole grains and lean proteins.  Clarified with the patient that I had wanted him to increase his sodium intake in the short-term rather than increase his caloric intake.  I suspect that  he was on Victoza mostly for weight loss since his hemoglobin A1c has been less than 6 in the recent past.  I referred him to a nutritionist and gave him a handout on the Mediterranean diet.  Patient has failed Victoza multiple times, so while this was effective for weight loss in the past, it side effects mitigate its use.     Craig Alu, MD [place]

## 2019-12-04 NOTE — Assessment & Plan Note (Signed)
Well-controlled today even without blood pressure medications.  We will continue to monitor.

## 2019-12-04 NOTE — Assessment & Plan Note (Signed)
Counseled patient that he needs to continue to watch his portion sizes and to eat mostly fruits and vegetables with whole grains and lean proteins.  Clarified with the patient that I had wanted him to increase his sodium intake in the short-term rather than increase his caloric intake.  I suspect that he was on Victoza mostly for weight loss since his hemoglobin A1c has been less than 6 in the recent past.  I referred him to a nutritionist and gave him a handout on the Mediterranean diet.  Patient has failed Victoza multiple times, so while this was effective for weight loss in the past, it side effects mitigate its use.

## 2019-12-04 NOTE — Assessment & Plan Note (Signed)
Improving due to medication changes.  Encouraged him to keep his follow-up with cardiology since he is still experiencing some symptoms of orthostasis and is at high risk for heart disease given his diabetes, hypertension, and morbid obesity.

## 2019-12-22 ENCOUNTER — Telehealth: Payer: Self-pay | Admitting: Family Medicine

## 2019-12-22 NOTE — Telephone Encounter (Signed)
There are no forms for this patient in my physical inbox

## 2019-12-22 NOTE — Telephone Encounter (Signed)
Pt is calling to see if the doctor has received forms from [employer] and if so have they been faxed back? He is also having [employer] fax them again now incase they have not been received. Please fax back ASAP. If you have questions please call him. jw

## 2019-12-26 NOTE — Telephone Encounter (Signed)
Spoke with pt and asked him to have them to fax it again and confirmed the number. Laurence Crofford Kennon Holter, CMA

## 2019-12-28 NOTE — Progress Notes (Deleted)
Cardiology Consult  Note    Date:  12/28/2019   ID:  Craig [name], DOB Mar 06, 1961, MRN [id number]  PCP:  Gerlene Fee, DO  Cardiologist:  Fransico Him, MD   No chief complaint on file.   History of Present Illness:  Craig [name] is a 59 y.o. male who is being seen today for the evaluation of orthostatic hypotension at the request of Ardelia Mems Delorse Limber, MD.  This is a 59yo male with a hx of DM2, anxiety and depression, GERD, HLD, morbid obesity and orthostatic hypotension.      Past Medical History:  Diagnosis Date  . Anemia 12/15/2014  . Anxiety and depression 08/17/2017  . Bilateral carpal tunnel syndrome 08/03/2016  . Cancer ([place]) abdomen  . Controlled type 2 diabetes mellitus without complication, without long-term current use of insulin ([place]) 09/17/2012   Well-controlled.  No history of insulin use.  . Dizziness 03/27/2012  . GERD (gastroesophageal reflux disease) 03/27/2012  . History of gastric cancer 02/14/2012   Followed by Dr. Wynetta Emery in [place].   . Hyperlipidemia associated with type 2 diabetes mellitus ([place]) 01/01/2013  . Morbid obesity with BMI of 50.0-59.9, adult ([place]) 06/05/2012  . Onychomycosis 08/27/2018  . Orthostatic hypotension 11/10/2019  . Perioral numbness 07/30/2018  . Poor dentition 12/02/2018  . Primary hypertension 04/30/2012  . Right hand paresthesia 03/26/2017    Past Surgical History:  Procedure Laterality Date  . ABDOMINAL SURGERY      Current Medications: No outpatient medications have been marked as taking for the 12/29/19 encounter (Appointment) with Sueanne Margarita, MD.    Allergies:   Patient has no known allergies.   Social History   Socioeconomic History  . Marital status: Single    Spouse name: Not on file  . Number of children: Not on file  . Years of education: Not on file  . Highest education level: Not on file  Occupational History  . Not on file  Tobacco Use  . Smoking status: Former Smoker    Start date:  08/14/1977    Quit date: 08/14/1996    Years since quitting: 23.3  . Smokeless tobacco: Never Used  Substance and Sexual Activity  . Alcohol use: No  . Drug use: Yes    Types: Cocaine, Marijuana, Heroin    Comment: Previous Hx of substance abuse  . Sexual activity: Yes  Other Topics Concern  . Not on file  Social History Narrative  . Not on file   Social Determinants of Health   Financial Resource Strain:   . Difficulty of Paying Living Expenses:   Food Insecurity:   . Worried About Charity fundraiser in the Last Year:   . Arboriculturist in the Last Year:   Transportation Needs:   . Film/video editor (Medical):   Marland Kitchen Lack of Transportation (Non-Medical):   Physical Activity:   . Days of Exercise per Week:   . Minutes of Exercise per Session:   Stress:   . Feeling of Stress :   Social Connections:   . Frequency of Communication with Friends and Family:   . Frequency of Social Gatherings with Friends and Family:   . Attends Religious Services:   . Active Member of Clubs or Organizations:   . Attends Archivist Meetings:   Marland Kitchen Marital Status:      Family History:  [name] family history includes Diabetes type II in his mother.   ROS:   Please see  the history of present illness.    ROS All other systems reviewed and are negative.  No flowsheet data found.     PHYSICAL EXAM:   VS:  There were no vitals taken for this visit.   GEN: Well nourished, well developed, in no acute distress  HEENT: normal  Neck: no JVD, carotid bruits, or masses Cardiac: RRR; no murmurs, rubs, or gallops,no edema.  Intact distal pulses bilaterally.  Respiratory:  clear to auscultation bilaterally, normal work of breathing GI: soft, nontender, nondistended, + BS MS: no deformity or atrophy  Skin: warm and dry, no rash Neuro:  Alert and Oriented x 3, Strength and sensation are intact Psych: euthymic mood, full affect  Wt Readings from Last 3 Encounters:  12/03/19 (!)  391 lb 3.2 oz (177.4 kg)  11/20/19 (!) 372 lb 3.2 oz (168.8 kg)  11/10/19 (!) 360 lb (163.3 kg)      Studies/Labs Reviewed:   EKG:  EKG is ordered today.  The ekg ordered today demonstrates ***  Recent Labs: 11/20/2019: ALT 38; BUN 11; Creatinine, Ser 1.21; Hemoglobin 11.8; Platelets 336; Potassium 5.0; Sodium 139; TSH 0.677   Lipid Panel    Component Value Date/Time   CHOL 144 04/11/2019 1506   TRIG 78 04/11/2019 1506   HDL 43 04/11/2019 1506   CHOLHDL 3.3 04/11/2019 1506   CHOLHDL 4.3 06/16/2015 0921   VLDL 13 06/16/2015 0921   LDLCALC 85 04/11/2019 1506   LDLDIRECT 90 07/13/2017 1606   LDLDIRECT 88 02/04/2016 1115    Additional studies/ records that were reviewed today include:  OV notes from PCP    ASSESSMENT:    No diagnosis found.   PLAN:  In order of problems listed above:  1. Orthostatic Hypotension  2.   HTN    Medication Adjustments/Labs and Tests Ordered: Current medicines are reviewed at length with the patient today.  Concerns regarding medicines are outlined above.  Medication changes, Labs and Tests ordered today are listed in the Patient Instructions below.  There are no Patient Instructions on file for this visit.   Signed, Fransico Him, MD  12/28/2019 10:36 PM    [facility] [address], [place], [place]  [address] Phone: [phone]; Fax: [phone]

## 2019-12-29 ENCOUNTER — Ambulatory Visit: Payer: Medicaid Other | Admitting: Cardiology

## 2019-12-30 ENCOUNTER — Other Ambulatory Visit: Payer: Self-pay

## 2019-12-30 ENCOUNTER — Ambulatory Visit (INDEPENDENT_AMBULATORY_CARE_PROVIDER_SITE_OTHER): Payer: Medicaid Other | Admitting: Family Medicine

## 2019-12-30 ENCOUNTER — Encounter: Payer: Self-pay | Admitting: Family Medicine

## 2019-12-30 VITALS — BP 110/76 | HR 98 | Wt 391.0 lb

## 2019-12-30 DIAGNOSIS — I951 Orthostatic hypotension: Secondary | ICD-10-CM

## 2019-12-30 DIAGNOSIS — R42 Dizziness and giddiness: Secondary | ICD-10-CM

## 2019-12-30 DIAGNOSIS — E119 Type 2 diabetes mellitus without complications: Secondary | ICD-10-CM

## 2019-12-30 DIAGNOSIS — I10 Essential (primary) hypertension: Secondary | ICD-10-CM | POA: Diagnosis not present

## 2019-12-30 MED ORDER — METFORMIN HCL 1000 MG PO TABS: 500.0000 mg | ORAL_TABLET | Freq: Every day | ORAL | 3 refills | Status: DC

## 2019-12-30 NOTE — Patient Instructions (Addendum)
It was very nice to see you today. Please enjoy the rest of your week.   Today you were seen for your diabetes and dizzines.   Please go to  hospital for an echo on 5/24 @9am   Follow up in September to recheck your A1c.  Check your BP at home if you feel headache or chest pain.  Follow up with cardiology and the GI doctor.  Please call the clinic at [phone] if your symptoms worsen or you have any concerns. It was our pleasure to serve you.

## 2019-12-30 NOTE — Progress Notes (Signed)
    SUBJECTIVE:   CHIEF COMPLAINT / HPI:   Craig [name] is a 59 yo M presenting for the concerns below.  Diabetes follow up CBG this morning was 85. Checks his blood sugar daily. A1c 10/15/2019 5.6  Dizziness Occurs with low CBGs and standing from a seated position. When blood sugar is in the 80-90s he will eat and feel less dizzy then. Hx of orthostatic hypotension and anemia. Endorses occasional use of compression stockings.   PERTINENT  PMH / PSH: HTN (blood pressure medication discontinue d/t recent office visit & orthostatic hypotension), hx of gastric cancer, GERD, Anemia, hx of anxiety depression  OBJECTIVE:   BP 110/76   Pulse 98   Wt (!) 391 lb (177.4 kg)   SpO2 99%   BMI 56.10 kg/m   General: Appears well, no acute distress. Age appropriate. Cardiac: RRR, normal heart sounds, no murmurs Respiratory: CTAB, normal effort Extremities: No edema or cyanosis. LE w/ venous stasis skin changes   Office Visit from 12/30/2019 in [place]  PHQ-2 Total Score  0      ASSESSMENT/PLAN:   Controlled type 2 diabetes mellitus without complication, without long-term current use of insulin ([facility]) Metformin decreased to 500mg  daily. Continue to check blood sugars daily. Follow up for A1c in June.   Dizziness Last visit offending agents such as victoza, valsartan, and carvedilol were discontinued. Continues to endorse dizziness with low CBG and standing. Will decrease metformin as above. Continue to encourage compression stocking use. Patient scheduled for echo during this visit. For the time being counseled on rising slowly from seated position. Will need cardiology follow up and scheduled with GI in June.   Primary hypertension BP well controlled. Continue to hold antihypertensives.    Gerlene Fee, [place]

## 2020-01-01 ENCOUNTER — Other Ambulatory Visit: Payer: Self-pay

## 2020-01-01 ENCOUNTER — Encounter: Payer: Self-pay | Admitting: Pharmacist

## 2020-01-01 ENCOUNTER — Ambulatory Visit (INDEPENDENT_AMBULATORY_CARE_PROVIDER_SITE_OTHER): Payer: Medicaid Other | Admitting: Pharmacist

## 2020-01-01 DIAGNOSIS — E785 Hyperlipidemia, unspecified: Secondary | ICD-10-CM

## 2020-01-01 DIAGNOSIS — E119 Type 2 diabetes mellitus without complications: Secondary | ICD-10-CM

## 2020-01-01 DIAGNOSIS — E1169 Type 2 diabetes mellitus with other specified complication: Secondary | ICD-10-CM | POA: Diagnosis not present

## 2020-01-01 MED ORDER — METFORMIN HCL ER 500 MG PO TB24: 500.0000 mg | ORAL_TABLET | Freq: Every day | ORAL | 3 refills | Status: DC

## 2020-01-01 NOTE — Progress Notes (Signed)
S:     Chief Complaint  Patient presents with  . Medication Management    Diabetes, weight    Patient arrives in good spirits, ambulating without assistance. Presents for diabetes evaluation, education, and management.  Patient was last seen by Primary Care Provider,  Dr. Janus Molder, on 12/30/2019.   Insurance coverage/medication affordability: [place] [employer]  Adherence to medication reported by patient.  Current diabetes medications include: metformin 500mg  daily  Current hypertension medications include: none currently.  Current hyperlipidemia medications include: atorvastatin 80mg  daily  Patient denies hypoglycemic events. Patient reports checking blood glucose 3-4 times per day.   Patient continues to endorse dizziness for past 3 weeks. Patient reports improvement in dizziness when consuming [place] sausage with breakfast.   Patient reported dietary habits: Eats 3 meals/day and has recently changed to increasing calorie consumption to in the morning due to dizziness. Patient was recently instructed by Dr. Janus Molder to increase sodium consumption. Breakfast: "full breakfast" with [place] sausage and 3 fried eggs or a poptart  Lunch: fish or fruit Dinner: salad  Drinks: water, cranberry juice   Patient-reported exercise habits: Patient previously unable to bowl for approximately 1 year due to COVID-19. Patient has recently started back bowling twice/week.    Patient reports neuropathy (nerve pain) increase after stopping gabapentin.     O:  Physical Exam Constitutional:      Appearance: He is obese.  Neurological:     Mental Status: He is alert.  Psychiatric:        Mood and Affect: Mood normal.        Thought Content: Thought content normal.        Judgment: Judgment normal.    Review of Systems  Neurological: Positive for dizziness.       Continued complaints of dizziness.      Lab Results  Component Value Date   HGBA1C 5.6 10/15/2019   Vitals:   01/01/20  0949  BP: 118/62  Pulse: 80  SpO2: 97%    Lipid Panel     Component Value Date/Time   CHOL 144 04/11/2019 1506   TRIG 78 04/11/2019 1506   HDL 43 04/11/2019 1506   CHOLHDL 3.3 04/11/2019 1506   CHOLHDL 4.3 06/16/2015 0921   VLDL 13 06/16/2015 0921   LDLCALC 85 04/11/2019 1506   LDLDIRECT 90 07/13/2017 1606   LDLDIRECT 88 02/04/2016 1115    7 day blood glucose average: 115  14 day blood glucose average: 122 30 day blood glucose average: 118 90 day blood glucose average: 115   Clinical Atherosclerotic Cardiovascular Disease (ASCVD): No  The 10-year ASCVD risk score Mikey Bussing DC Jr., et al., 2013) is: 11.1%   Values used to calculate the score:     Age: 59 years     Sex: Male     Is Non-Hispanic African American: Yes     Diabetic: Yes     Tobacco smoker: No     Systolic Blood Pressure: 123456 mmHg     Is BP treated: No     HDL Cholesterol: 43 mg/dL     Total Cholesterol: 144 mg/dL    A/P: Diabetes longstanding currently well controlled. Medication adherence appears good per patient report. A1c 5.6 in March 2021 was at goal. Average blood glucose at home obtained from glucometer was well controlled.  - Continue metformin 500mg  daily. [facility] so patient would not have to cut tablets.  - Continue to hold liraglutide (Victoza) as A1c  is well controlled.  - Continue to bowl for exercise now that it has resumed due to COVID-19 and continue current diet with focus on sodium intake (see below).  - Next A1C anticipated in 3 months  ASCVD risk - primary prevention in patient with diabetes. Last LDL is controlled. ASCVD risk score is >20%   - Continue atorvastatin 80mg  daily - Continue aspirin 81mg  daily   Dizziness reported by patient. Patient consumes a low sodium diet with increased water intake and was recently instructed by Dr. Janus Molder to increase sodium consumption. Gabapentin which could cause dizziness was recently stopped and did not  improve symptoms per patient.  - Instructed patient about monitoring sodium consumption daily and targeting 2000mg  of sodium per day by reading sodium content on nutrition labels.  - Recommended for patient to consider trying Gatorade Zero for electrolyte replacement.  - Continue plan to attend a nutrition class on May 27th.   Hypertension longstanding currently well controlled off blood pressure medications.  Blood pressure goal <130 / 80 mmHg.  - Continue off blood pressure medications.   COVID-19 Vaccination. Patient endorsed not receiving COVID-19 vaccine and was hesitant to receive vaccination. Patient reports his brother unfortunately passed due to COVID-19 and mother was very ill. Risk versus benefits of the vaccine were discussed.  - Counseled and educated patient on importance of considering COVID-19 vaccine. At end of visit, patient was amendable to receiving COVID-19 if supplied by [facility] when available.    Written patient instructions provided.  Total time in face to face counseling 30 minutes.   Follow up PCP, Dr. Julieta Bellini ECHO. Follow up with Pharmacist in Fall 2021 or sooner if needed. Patient seen with Sherre Poot, PharmD Candidate and Cristela Felt, PharmD PGY-1 Resident.

## 2020-01-01 NOTE — Progress Notes (Signed)
Reviewed: I agree with Dr. Koval's documentation and management. 

## 2020-01-01 NOTE — Assessment & Plan Note (Signed)
Diabetes longstanding currently well controlled. Medication adherence appears good per patient report. A1c 5.6 in March 2021 was at goal. Average blood glucose at home obtained from glucometer was well controlled.  - Continue metformin 500mg  daily. [facility] would not have to cut tablets.  - Continue to hold liraglutide (Victoza) as A1c is well controlled.  - Continue to bowl for exercise now that it has resumed due to COVID-19 and continue current diet with focus on sodium intake (see below).  - Next A1C anticipated in 3 months

## 2020-01-01 NOTE — Assessment & Plan Note (Signed)
ASCVD risk - primary prevention in patient with diabetes. Last LDL is controlled. ASCVD risk score is >20%   - Continue atorvastatin 80mg  daily - Continue aspirin 81mg  daily

## 2020-01-01 NOTE — Patient Instructions (Addendum)
It was great to see you today! Your blood glucose average at home was well controlled and your blood pressure in the office was at goal. As Dr. Janus Molder recommended your dizziness may be improved by increasing your sodium intake.   Follow up plan:  - Consider trying Gatorade Zero. Monitor your sodium intake by checking the back of food labels to take 2000mg  per day.  - Please attend your nutrition class on May 27th.   - Continue taking metformin 500mg  daily and checking your blood glucose. Your [facility] for metformin [facility]mg  [facility] [address] and [facility] your autorefills for the medications you just stopped.   - Continue to exercise at bowling now that it is resumed and focusing on your weight. - Please consider getting the COVID-19 vaccination as we discussed today.  - Follow up with Dr. Janus Molder after your cardiology test and follow up with myself, Dr. Valentina Lucks, in the fall or sooner if needed.

## 2020-01-02 ENCOUNTER — Telehealth: Payer: Self-pay | Admitting: Family Medicine

## 2020-01-02 NOTE — Telephone Encounter (Signed)
Prior transportation forms signed 01/01/2020. These are likely duplicates, thank you.

## 2020-01-02 NOTE — Telephone Encounter (Signed)
[employer]-Transportation form dropped off for at front desk for completion.  Verified that patient section of form has been completed.  Last DOS/WCC with PCP was 01-01-2020.  Placed form in RED team folder to be completed by clinical staff.  Richrd Humbles

## 2020-01-02 NOTE — Telephone Encounter (Signed)
Placed in MDs box to be filled out. Grisell Bissette, CMA  

## 2020-01-04 NOTE — Assessment & Plan Note (Signed)
BP well controlled. Continue to hold antihypertensives.

## 2020-01-04 NOTE — Assessment & Plan Note (Signed)
Metformin decreased to 500mg  daily. Continue to check blood sugars daily. Follow up for A1c in June.

## 2020-01-04 NOTE — Assessment & Plan Note (Signed)
Last visit offending agents such as victoza, valsartan, and carvedilol were discontinued. Continues to endorse dizziness with low CBG and standing. Will decrease metformin as above. Continue to encourage compression stocking use. Patient scheduled for echo during this visit. For the time being counseled on rising slowly from seated position. Will need cardiology follow up and scheduled with GI in June.

## 2020-01-05 ENCOUNTER — Ambulatory Visit (HOSPITAL_COMMUNITY)
Admission: RE | Admit: 2020-01-05 | Discharge: 2020-01-05 | Disposition: A | Discharge status: Home / Self Care | Payer: Medicaid Other | Source: Ambulatory Visit | Attending: Family Medicine | Admitting: Family Medicine

## 2020-01-05 ENCOUNTER — Other Ambulatory Visit: Payer: Self-pay

## 2020-01-05 DIAGNOSIS — I080 Rheumatic disorders of both mitral and aortic valves: Secondary | ICD-10-CM | POA: Insufficient documentation

## 2020-01-05 DIAGNOSIS — R42 Dizziness and giddiness: Secondary | ICD-10-CM | POA: Diagnosis not present

## 2020-01-05 DIAGNOSIS — Z87891 Personal history of nicotine dependence: Secondary | ICD-10-CM | POA: Diagnosis not present

## 2020-01-05 DIAGNOSIS — I951 Orthostatic hypotension: Secondary | ICD-10-CM | POA: Insufficient documentation

## 2020-01-05 DIAGNOSIS — E119 Type 2 diabetes mellitus without complications: Secondary | ICD-10-CM | POA: Insufficient documentation

## 2020-01-05 DIAGNOSIS — E785 Hyperlipidemia, unspecified: Secondary | ICD-10-CM | POA: Insufficient documentation

## 2020-01-05 NOTE — Progress Notes (Signed)
  Echocardiogram 2D Echocardiogram has been performed.  Craig [name] 01/05/2020, 9:49 AM

## 2020-01-05 NOTE — Telephone Encounter (Signed)
Pt informed. Maame Dack, CMA  

## 2020-01-06 NOTE — Progress Notes (Signed)
Cardiology Office Note:    Date:  01/07/2020   ID:  Craig [name], DOB [birth year], MRN [id number]  PCP:  Gerlene Fee, DO  Cardiologist:  Donato Heinz, MD  Electrophysiologist:  None   Referring MD: Leeanne Rio, MD   No chief complaint on file.   History of Present Illness:    Craig [name] is a 59 y.o. male with a hx of gastric cancer, T2DM, GERD, morbid obesity, hyperlipidemia, hypertension who is referred by Dr. Ardelia Mems for evaluation of orthostatic hypotension.  Victoza, gabapentin, valsartan, and carvedilol were discontinued.  He reports he has been having dizziness x3 to 4 weeks.  Occurring multiple times a day.  States that sometimes feels like room is spinning and sometimes just feels lightheaded.  Denies any syncopal episode.  Does report occasional palpitations where he feels like his heart is racing.  He cannot identify cause of dizziness.  Denies relationship to position change.  Does report some improvement since stopping medications.  States that prior to the pandemic, he was bowling 4 times per week and swimming 3 times per week.  Has gained 80 pounds since the pandemic started.  He now has a total gym and does exercises every day for 20 minutes.  He denies any chest pain or dyspnea.  Quit smoking years ago.  Father had MI at age 26.  TTE on 01/05/2020 showed LVEF 55 to 60%, normal RV function, no significant valvular disease.  Past Medical History:  Diagnosis Date  . Anemia 12/15/2014  . Anxiety and depression 08/17/2017  . Bilateral carpal tunnel syndrome 08/03/2016  . Cancer ([place]) abdomen  . Controlled type 2 diabetes mellitus without complication, without long-term current use of insulin ([place]) 09/17/2012   Well-controlled.  No history of insulin use.  . Dizziness 03/27/2012  . GERD (gastroesophageal reflux disease) 03/27/2012  . History of gastric cancer 02/14/2012   Followed by Dr. Wynetta Emery in [place].   . Hyperlipidemia associated with type 2  diabetes mellitus ([place]) 01/01/2013  . Morbid obesity with BMI of 50.0-59.9, adult ([place]) 06/05/2012  . Onychomycosis 08/27/2018  . Orthostatic hypotension 11/10/2019  . Perioral numbness 07/30/2018  . Poor dentition 12/02/2018  . Primary hypertension 04/30/2012  . Right hand paresthesia 03/26/2017    Past Surgical History:  Procedure Laterality Date  . ABDOMINAL SURGERY      Current Medications: Current Meds  Medication Sig  . Accu-Chek Softclix Lancets lancets USE UP TO 3 TIMES DAILY TO CHECK BLOOD SUGAR  . antiseptic oral rinse (BIOTENE) LIQD 15 mLs by Mouth Rinse route as needed for dry mouth.  Marland Kitchen aspirin 81 MG EC tablet TAKE 1 TABLET BY MOUTH EVERY DAY  . atorvastatin (LIPITOR) 80 MG tablet TAKE 1 TABLET BY MOUTH EVERY DAY  . glucose blood (ACCU-CHEK AVIVA PLUS) test strip 1 EACH BY OTHER ROUTE 4 (FOUR) TIMES DAILY - BEFORE MEALS AND AT BEDTIME. USE AS INSTRUCTED  . metFORMIN (GLUCOPHAGE-XR) 500 MG 24 hr tablet Take 1 tablet (500 mg total) by mouth daily with breakfast.  . Multiple Vitamins-Minerals (VITAMINS TO GO MEN) MISC Take 1 each by mouth in the morning, at noon, and at bedtime. Take 1 GOLI supplement three time daily  . pantoprazole (PROTONIX) 40 MG tablet TAKE 1 TABLET BY MOUTH EVERY DAY  . terbinafine (LAMISIL AT) 1 % cream Apply 1 application topically 2 (two) times daily. (Patient taking differently: Apply 1 application topically daily. )     Allergies:   Patient has no known  allergies.   Social History   Socioeconomic History  . Marital status: Single    Spouse name: Not on file  . Number of children: Not on file  . Years of education: Not on file  . Highest education level: Not on file  Occupational History  . Not on file  Tobacco Use  . Smoking status: Former Smoker    Start date: 08/14/1977    Quit date: 08/14/1996    Years since quitting: 23.4  . Smokeless tobacco: Never Used  Substance and Sexual Activity  . Alcohol use: No  . Drug use: Yes    Types:  Cocaine, Marijuana, Heroin    Comment: Previous Hx of substance abuse  . Sexual activity: Yes  Other Topics Concern  . Not on file  Social History Narrative  . Not on file   Social Determinants of Health   Financial Resource Strain:   . Difficulty of Paying Living Expenses:   Food Insecurity:   . Worried About Charity fundraiser in the Last Year:   . Arboriculturist in the Last Year:   Transportation Needs:   . Film/video editor (Medical):   Marland Kitchen Lack of Transportation (Non-Medical):   Physical Activity:   . Days of Exercise per Week:   . Minutes of Exercise per Session:   Stress:   . Feeling of Stress :   Social Connections:   . Frequency of Communication with Friends and Family:   . Frequency of Social Gatherings with Friends and Family:   . Attends Religious Services:   . Active Member of Clubs or Organizations:   . Attends Archivist Meetings:   Marland Kitchen Marital Status:      Family History: [name] family history includes Diabetes type II in his mother.  ROS:   Please see the history of present illness.     All other systems reviewed and are negative.  EKGs/Labs/Other Studies Reviewed:    The following studies were reviewed today:   EKG:  EKG is ordered today.  The ekg ordered today demonstrates normal sinus rhythm, rate 76, PVCs, no ST/T abnormality  TTE 01/05/20: 1. Left ventricular ejection fraction, by estimation, is 55 to 60%. The  left ventricle has normal function. The left ventricle has no regional  wall motion abnormalities. Left ventricular diastolic parameters were  normal.  2. Right ventricular systolic function is normal. The right ventricular  size is normal. Tricuspid regurgitation signal is inadequate for assessing  PA pressure.  3. The mitral valve is grossly normal. No evidence of mitral valve  regurgitation. No evidence of mitral stenosis.  4. The aortic valve is tricuspid. Aortic valve regurgitation is not  visualized.  Mild to moderate aortic valve sclerosis/calcification is  present, without any evidence of aortic stenosis.  5. The inferior vena cava is normal in size with greater than 50%  respiratory variability, suggesting right atrial pressure of 3 mmHg.   Recent Labs: 11/20/2019: ALT 38; BUN 11; Creatinine, Ser 1.21; Hemoglobin 11.8; Platelets 336; Potassium 5.0; Sodium 139; TSH 0.677  Recent Lipid Panel    Component Value Date/Time   CHOL 144 04/11/2019 1506   TRIG 78 04/11/2019 1506   HDL 43 04/11/2019 1506   CHOLHDL 3.3 04/11/2019 1506   CHOLHDL 4.3 06/16/2015 0921   VLDL 13 06/16/2015 0921   LDLCALC 85 04/11/2019 1506   LDLDIRECT 90 07/13/2017 1606   LDLDIRECT 88 02/04/2016 1115    Physical Exam:    VS:  BP 131/89 Comment: while lying down  Pulse 76   Ht 5\' 10"  (1.778 m)   Wt (!) 396 lb 3.2 oz (179.7 kg)   SpO2 98%   BMI 56.85 kg/m     Wt Readings from Last 3 Encounters:  01/07/20 (!) 396 lb 3.2 oz (179.7 kg)  01/01/20 (!) 392 lb (177.8 kg)  12/30/19 (!) 391 lb (177.4 kg)     GEN:  n no acute distress HEENT: Normal NECK: No JVD; No carotid bruits LYMPHATICS: No lymphadenopathy CARDIAC:RRR, no murmurs, distant heart sounds RESPIRATORY:  Clear to auscultation without rales, wheezing or rhonchi  ABDOMEN: Soft, non-tender, non-distended MUSCULOSKELETAL:  No edema; No deformity  SKIN: Warm and dry NEUROLOGIC:  Alert and oriented x 3 PSYCHIATRIC:  Normal affect   ASSESSMENT:    1. Lightheadedness   2. Palpitations   3. Hypertension, unspecified type   4. Hyperlipidemia, unspecified hyperlipidemia type   5. Morbid obesity ([place])    PLAN:    Lightheadedness: Unclear cause.  Orthostatics in clinic today were negative for drop in blood pressure, but did have significant increase in heart rate.  Could represent POTS.  TTE shows no structural heart disease.  Given palpitations and PVCs on EKG, will check Zio patch x2 weeks to rule out arrhythmia  PVCs/palpitations: check Zio  patch as above  Hypertension: Carvedilol and valsartan were recently discontinued.  Hypertensive in clinic today, will restart valsartan 80 mg daily.  Check BMP in 1 week.  Asked patient to monitor BP daily at home for next 2 weeks and call with results.  Hyperlipidemia: On atorvastatin 80 mg daily.  LDL 85 on 04/11/2019  T2DM: On Metformin 500 mg daily.  A1c 5.6 on 10/15/2019  Morbid obesity:Body mass index is 56.85 kg/m.  Diet and exercise encouraged  RTC in 3 months   Medication Adjustments/Labs and Tests Ordered: Current medicines are reviewed at length with the patient today.  Concerns regarding medicines are outlined above.  Orders Placed This Encounter  Procedures  . Basic metabolic panel  . LONG TERM MONITOR (3-14 DAYS)  . EKG 12-Lead   Meds ordered this encounter  Medications  . valsartan (DIOVAN) 80 MG tablet    Sig: Take 1 tablet (80 mg total) by mouth daily.    Dispense:  90 tablet    Refill:  3    Patient Instructions  Medication Instructions:  START valsartan 80 mg daily  *If you need a refill on your cardiac medications before your next appointment, please call your pharmacy*  Labs: Please return for labs in 1 WEEK (BMET) --this is NOT fasting  Our in office lab hours are Monday-Friday 8:00-4:00, closed for lunch 12:45-1:45 pm.  No appointment needed.   Testing/Procedures:  [facility]- Long Term Monitor Instructions   Your physician has requested you wear your ZIO patch monitor 14 days.   This is a single patch monitor.  [employer] supplies one patch monitor per enrollment.  Additional stickers are not available.   Please do not apply patch if you will be having a Nuclear Stress Test, Echocardiogram, Cardiac CT, MRI, or Chest Xray during the time frame you would be wearing the monitor. The patch cannot be worn during these tests.  You cannot remove and re-apply the ZIO XT patch monitor.   Your ZIO patch monitor will be sent USPS Priority mail from [facility] directly to your home address. The monitor may also be mailed to a PO BOX if home delivery is not available.  It may take 3-5 days to receive your monitor after you have been enrolled.   Once you have received you monitor, please review enclosed instructions.  Your monitor has already been registered assigning a specific monitor serial # to you.   Applying the monitor   Shave hair from upper left chest.   Hold abrader disc by orange tab.  Rub abrader in 40 strokes over left upper chest as indicated in your monitor instructions.   Clean area with 4 enclosed alcohol pads .  Use all pads to assure are is cleaned thoroughly.  Let dry.   Apply patch as indicated in monitor instructions.  Patch will be place under collarbone on left side of chest with arrow pointing upward.   Rub patch adhesive wings for 2 minutes.Remove white label marked "1".  Remove white label marked "2".  Rub patch adhesive wings for 2 additional minutes.   While looking in a mirror, press and release button in center of patch.  A small green light will flash 3-4 times .  This will be your only indicator the monitor has been turned on.     Do not shower for the first 24 hours.  You may shower after the first 24 hours.   Press button if you feel a symptom. You will hear a small click.  Record Date, Time and Symptom in the Patient Log Book.   When you are ready to remove patch, follow instructions on last 2 pages of Patient Log Book.  Stick patch monitor onto last page of Patient Log Book.   Place Patient Log Book in [place] box.  Use locking tab on box and tape box closed securely.  The [employer] has [employer] on it.  Please place in mailbox as soon as possible.  Your physician should have your test results approximately 7 days after the monitor has been mailed back to [facility].   Call [place] at [phone] if you have questions regarding your ZIO XT patch monitor.   Call them immediately if you see an orange light blinking on your monitor.   If your monitor falls off in less than 4 days contact our Monitor department at [phone].  If your monitor becomes loose or falls off after 4 days call [employer] at [phone] for suggestions on securing your monitor.   Follow-Up: At [facility], you and your health needs are our priority.  As part of our continuing mission to provide you with exceptional heart care, we have created designated Provider Care Teams.  These Care Teams include your primary Cardiologist (physician) and Advanced Practice Providers (APPs -  Physician Assistants and Nurse Practitioners) who all work together to provide you with the care you need, when you need it.  We recommend signing up for the patient portal called "MyChart".  Sign up information is provided on this After Visit Summary.  MyChart is used to connect with patients for Virtual Visits (Telemedicine).  Patients are able to view lab/test results, encounter notes, upcoming appointments, etc.  Non-urgent messages can be sent to your provider as well.   To learn more about what you can do with MyChart, go to [email or web address].    Your next appointment:   3 month(s)  The format for your next appointment:   In Person  Provider:   Oswaldo Milian, MD   Other Instructions Please check your blood pressure at home daily, write it down.  Call the office of send message via [place]  with the readings in 2 weeks for Dr. Gardiner Rhyme to review.       Signed, Donato Heinz, MD  01/07/2020 10:25 AM    [place]

## 2020-01-07 ENCOUNTER — Encounter: Payer: Self-pay | Admitting: Cardiology

## 2020-01-07 ENCOUNTER — Other Ambulatory Visit: Payer: Self-pay

## 2020-01-07 ENCOUNTER — Ambulatory Visit (INDEPENDENT_AMBULATORY_CARE_PROVIDER_SITE_OTHER): Payer: Medicaid Other | Admitting: Cardiology

## 2020-01-07 ENCOUNTER — Telehealth: Payer: Self-pay | Admitting: Radiology

## 2020-01-07 VITALS — BP 131/89 | HR 76 | Ht 70.0 in | Wt 396.2 lb

## 2020-01-07 DIAGNOSIS — R42 Dizziness and giddiness: Secondary | ICD-10-CM | POA: Diagnosis not present

## 2020-01-07 DIAGNOSIS — E785 Hyperlipidemia, unspecified: Secondary | ICD-10-CM | POA: Diagnosis not present

## 2020-01-07 DIAGNOSIS — I10 Essential (primary) hypertension: Secondary | ICD-10-CM

## 2020-01-07 DIAGNOSIS — R002 Palpitations: Secondary | ICD-10-CM

## 2020-01-07 MED ORDER — VALSARTAN 80 MG PO TABS: 80.0000 mg | ORAL_TABLET | Freq: Every day | ORAL | 3 refills | Status: DC

## 2020-01-07 NOTE — Patient Instructions (Addendum)
Medication Instructions:  START valsartan 80 mg daily  *If you need a refill on your cardiac medications before your next appointment, please call your pharmacy*  Labs: Please return for labs in 1 WEEK (BMET) --this is NOT fasting  Our in office lab hours are Monday-Friday 8:00-4:00, closed for lunch 12:45-1:45 pm.  No appointment needed.   Testing/Procedures:  [facility]- Long Term Monitor Instructions   Your physician has requested you wear your ZIO patch monitor 14 days.   This is a single patch monitor.  [employer] supplies one patch monitor per enrollment.  Additional stickers are not available.   Please do not apply patch if you will be having a Nuclear Stress Test, Echocardiogram, Cardiac CT, MRI, or Chest Xray during the time frame you would be wearing the monitor. The patch cannot be worn during these tests.  You cannot remove and re-apply the ZIO XT patch monitor.   Your ZIO patch monitor will be sent USPS Priority mail from [facility] directly to your home address. The monitor may also be mailed to a PO BOX if home delivery is not available.   It may take 3-5 days to receive your monitor after you have been enrolled.   Once you have received you monitor, please review enclosed instructions.  Your monitor has already been registered assigning a specific monitor serial # to you.   Applying the monitor   Shave hair from upper left chest.   Hold abrader disc by orange tab.  Rub abrader in 40 strokes over left upper chest as indicated in your monitor instructions.   Clean area with 4 enclosed alcohol pads .  Use all pads to assure are is cleaned thoroughly.  Let dry.   Apply patch as indicated in monitor instructions.  Patch will be place under collarbone on left side of chest with arrow pointing upward.   Rub patch adhesive wings for 2 minutes.Remove white label marked "1".  Remove white label marked "2".  Rub patch adhesive wings for 2 additional minutes.   While  looking in a mirror, press and release button in center of patch.  A small green light will flash 3-4 times .  This will be your only indicator the monitor has been turned on.     Do not shower for the first 24 hours.  You may shower after the first 24 hours.   Press button if you feel a symptom. You will hear a small click.  Record Date, Time and Symptom in the Patient Log Book.   When you are ready to remove patch, follow instructions on last 2 pages of Patient Log Book.  Stick patch monitor onto last page of Patient Log Book.   Place Patient Log Book in [place] box.  Use locking tab on box and tape box closed securely.  The [employer] has [employer] on it.  Please place in mailbox as soon as possible.  Your physician should have your test results approximately 7 days after the monitor has been mailed back to [facility].   Call [place] at [phone] if you have questions regarding your ZIO XT patch monitor.  Call them immediately if you see an orange light blinking on your monitor.   If your monitor falls off in less than 4 days contact our Monitor department at [phone].  If your monitor becomes loose or falls off after 4 days call [employer] at [phone] for suggestions on securing your monitor.   Follow-Up:  At [facility], you and your health needs are our priority.  As part of our continuing mission to provide you with exceptional heart care, we have created designated Provider Care Teams.  These Care Teams include your primary Cardiologist (physician) and Advanced Practice Providers (APPs -  Physician Assistants and Nurse Practitioners) who all work together to provide you with the care you need, when you need it.  We recommend signing up for the patient portal called "MyChart".  Sign up information is provided on this After Visit Summary.  MyChart is used to connect with patients for Virtual Visits (Telemedicine).  Patients are able to view  lab/test results, encounter notes, upcoming appointments, etc.  Non-urgent messages can be sent to your provider as well.   To learn more about what you can do with MyChart, go to [email or web address].    Your next appointment:   3 month(s)  The format for your next appointment:   In Person  Provider:   Oswaldo Milian, MD   Other Instructions Please check your blood pressure at home daily, write it down.  Call the office of send message via Mychart with the readings in 2 weeks for Dr. Gardiner Rhyme to review.

## 2020-01-07 NOTE — Telephone Encounter (Signed)
Enrolled patient for a 14 day Zio monitor to be mailed to patients home.  

## 2020-01-08 ENCOUNTER — Ambulatory Visit: Payer: Medicaid Other | Admitting: Registered"

## 2020-01-08 ENCOUNTER — Ambulatory Visit (INDEPENDENT_AMBULATORY_CARE_PROVIDER_SITE_OTHER): Payer: Medicaid Other | Admitting: Family Medicine

## 2020-01-08 VITALS — BP 138/94 | HR 98 | Ht 70.0 in | Wt 396.0 lb

## 2020-01-08 DIAGNOSIS — R42 Dizziness and giddiness: Secondary | ICD-10-CM | POA: Diagnosis not present

## 2020-01-08 DIAGNOSIS — I10 Essential (primary) hypertension: Secondary | ICD-10-CM

## 2020-01-08 NOTE — Assessment & Plan Note (Signed)
Currently being w/u by cardiology. Seems to be improving. Awaiting zio patch. Will advise to continue f/u with cardiology at PCP. Will obtain BMP per cardiologist request. If zio patch is negative can consider vestibular PT. Advised f/u with PCP in 1 month.

## 2020-01-08 NOTE — Progress Notes (Deleted)
Patient was seen by cardiologist yesterday and was told that other doctors should have addressed his blood pressure before. He was discontinued off multiple medications which caused dizziness.   Now dizziness is improved but still has LH in the AM. States this happens when he wakes up early. Does improve as he gets up and moves and eats. Does note occasional palpitations. Had an echo on Monday and had EKG yesterday. Patient was told by cardiologist his heart is healthy but was told he should not have all his medications discontinued. States he feels like the other doctors were not listening to him. Was told by another doctor to increase sodium. He has a family history of HTN, cardiac disease, and elevated sodium. He is worried about increasing his sodium given his obesity.   Patient thinks that after increasing sodium his blood pressure has gone "sky high". States he is trying to eat better but now sodium is elevated. States he is now working with a nurse to lower his sodium.   Patient is wondering why his blood pressure is changing now since he has been taking medications for years now.   Patient is worried about dizziness since he lives alone and travels. Patient is traveling on Monday AM. Is starting a zio patch x2 weeks.

## 2020-01-08 NOTE — Patient Instructions (Signed)
1. We will get a BMP today 2. Continue to follow up with your cardiologist 3. Please call our office if you have worsening dizziness

## 2020-01-08 NOTE — Progress Notes (Signed)
    SUBJECTIVE:   CHIEF COMPLAINT / HPI:   Requesting BMP Seen by cardiologist on 01/08/20. At that time patient was started on Zio patch x2 weeks to r/o arrythmia. For patient's HTN valsartan 80mg  was re-started and advised to re-check BMP in 1 week, future order was placed.   Patient was seen by cardiologist yesterday and was told that other doctors should have addressed his blood pressure before. He was discontinued off multiple medications which caused dizziness.   Now dizziness is improved but still has LH in the AM. States this happens when he wakes up early. Does improve as he gets up and moves and eats. Does note occasional palpitations. Had an echo on Monday and had EKG yesterday. Patient was told by cardiologist his heart is healthy but was told he should not have all his medications discontinued. States he feels like the other doctors were not listening to him. Was told by another doctor to increase sodium. He has a family history of HTN, cardiac disease, and elevated sodium. He is worried about increasing his sodium given his obesity.   Patient thinks that after increasing sodium his blood pressure has gone "sky high". States he is trying to eat better but now sodium is elevated. States he is now working with a nurse to lower his sodium.   Patient is wondering why his blood pressure is changing now since he has been taking medications for years now.   Patient is worried about dizziness since he lives alone and travels. Patient is traveling on Monday AM. Is starting a zio patch x2 weeks.   PERTINENT  PMH / PSH: HTN, orthostatic hypotension, GERD, T2DM, anemia, dizziness, obesity   OBJECTIVE:   BP (!) 138/94   Pulse 98   Ht 5\' 10"  (1.778 m)   Wt (!) 396 lb (179.6 kg)   SpO2 97%   BMI 56.82 kg/m   Gen: awake and alert, NAD Cardio: RRR, no MRG Resp: CTAB, no wheezes, rales or rhonchi GI: soft, non tender, non distended, bowel sounds present Ext: no edema Neuro: no focal  deficits, CN2-12 intact, sensation intact, PERRL  ASSESSMENT/PLAN:   Dizziness Currently being w/u by cardiology. Seems to be improving. Awaiting zio patch. Will advise to continue f/u with cardiology at PCP. Will obtain BMP per cardiologist request. If zio patch is negative can consider vestibular PT. Advised f/u with PCP in 1 month.      Caroline More, [place]

## 2020-01-09 ENCOUNTER — Telehealth: Payer: Self-pay | Admitting: *Deleted

## 2020-01-09 ENCOUNTER — Telehealth: Payer: Self-pay | Admitting: Cardiology

## 2020-01-09 LAB — BASIC METABOLIC PANEL
BUN/Creatinine Ratio: 9 (ref 9–20)
BUN: 9 mg/dL (ref 6–24)
CO2: 24 mmol/L (ref 20–29)
Calcium: 9.2 mg/dL (ref 8.7–10.2)
Chloride: 102 mmol/L (ref 96–106)
Creatinine, Ser: 1.04 mg/dL (ref 0.76–1.27)
GFR calc Af Amer: 92 mL/min/{1.73_m2} (ref >59)
GFR calc non Af Amer: 79 mL/min/{1.73_m2} (ref >59)
Glucose: 108 mg/dL — ABNORMAL HIGH (ref 65–99)
Potassium: 4.3 mmol/L (ref 3.5–5.2)
Sodium: 140 mmol/L (ref 134–144)

## 2020-01-09 NOTE — Telephone Encounter (Signed)
Spoke with pt who report he started taking valsartan yesterday and an hr after felt really dizzy. He report systolic BP at that time was 154. Pt state he use to take valsartan a few years ago and now remember it caused the same symptoms. Pt report he did not take medication this morning and feels a little better. BP 140/88. Will route to MD for recommendations.

## 2020-01-09 NOTE — Telephone Encounter (Signed)
Valsartan was one of the medications that was just stopped recently by his primary care physician, think he may be confusing this with a different medication.  It seems unlikely the valsartan is causing the dizziness if his blood pressure was 154, as would expect if valsartan was causing dizziness that would cause his blood pressure to drop

## 2020-01-09 NOTE — Telephone Encounter (Signed)
Pt c/o medication issue:  1. Name of Medication: Valsartan  2. How are you currently taking this medication (dosage and times per day)? 1 tablet a day- did not take it this morning  3. Are you having a reaction (difficulty breathing--STAT)? no  4. What is your medication issue? Feel dizzy

## 2020-01-09 NOTE — Telephone Encounter (Signed)
Called and discussed with patient. Patient was wondering what was causing dizziness if BMP was wnl. Of note it appears patient called cardiologist today for dizziness. I informed him that the cause of dizziness cannot be determined until w/u is completed by cardiologist. He informed me a nurse would be coming tomorrow to help him start his zio patch. Pending these results we can determine a possible cause. Patient should continue to f/u with PCP and cardiology  Caroline More, DO, PGY-3 [facility] 01/09/2020 5:26 PM

## 2020-01-09 NOTE — Telephone Encounter (Signed)
Returned call to patient, states he was originially prescribed valsartan by PCP, stopped due to dizziness.    States he took a dose yesterday and within 30-40 mins he started having dizziness and lightheadedness.   BP was 123456 systolic.     He states he was told not to take it today by his PCP and he states dizziness has improved.    BP around lunch time was 134/73.   Advised unsure if this is related to medication, blood pressure elevated when dizziness occurred.  Patient does not want to take this medication.     Advised to continue to monitor blood pressure and keep log, avoid salt.    Would discuss with Dr. Gardiner Rhyme and call back if additional recommendations given.   Patient verbalized understanding.

## 2020-01-09 NOTE — Telephone Encounter (Signed)
Pt informed but wants to know why he is still having dizziness wants dr to call him back. Merie Wulf Kennon Holter, CMA

## 2020-01-09 NOTE — Telephone Encounter (Signed)
-----   Message from Craig More, DO sent at 01/09/2020 11:36 AM EDT ----- Please inform patient that results of BMP are negative.

## 2020-01-13 NOTE — Telephone Encounter (Signed)
Called patient and informed of below. Patient has also spoken with cardiologist today who advised patient to continue measuring blood pressures. ED precautions given to patient and scheduled with Dr. Tammi Klippel for follow up next week. PCP has no openings until the middle of June and patient did not want to wait that long.   To Dr. Abby Potash, RN

## 2020-01-13 NOTE — Telephone Encounter (Signed)
Patient calls nurse line stating that SCAT form was never received. Checked provider box and batch scanning, unable to find form. Please advise if you still have this or where it may be.   Talbot Grumbling, RN

## 2020-01-13 NOTE — Telephone Encounter (Signed)
Patient calls nurse line to report continued dizziness with Valsartan 80 mg. Patient is currently out of town and states that he will call for follow up when he returns. Patient asking if there is any medication adjustments in the meantime to help with dizziness.   To PCP  Talbot Grumbling, RN

## 2020-01-13 NOTE — Telephone Encounter (Signed)
Discussed with Dr. Gardiner Rhyme, no new orders received.  Continue to monitor BP closely.  Patient was made aware.

## 2020-01-13 NOTE — Telephone Encounter (Signed)
These forms were placed in the to be faxed designated area 01/01/2020. Please inform the patient he will have to re-issue the forms as they are not able to be located at the office at this time. Sorry for the inconvenience.   Dr. Janus Molder, DO

## 2020-01-13 NOTE — Telephone Encounter (Signed)
Thank you for keeping me in the loop and providing great care and follow up for my patient.

## 2020-01-13 NOTE — Telephone Encounter (Signed)
I would recommend calling cardiology as he is being w/u for possible cardiac etiology for dizziness. Patient should follow up with PCP ASAP. If dizziness worsens he should be evaluated in either UC or ED.   Dalphine Handing, PGY-3 [facility] 01/13/2020 10:57 AM

## 2020-01-21 ENCOUNTER — Ambulatory Visit (INDEPENDENT_AMBULATORY_CARE_PROVIDER_SITE_OTHER): Payer: Medicaid Other | Admitting: Family Medicine

## 2020-01-21 ENCOUNTER — Other Ambulatory Visit: Payer: Self-pay

## 2020-01-21 ENCOUNTER — Encounter: Payer: Self-pay | Admitting: Family Medicine

## 2020-01-21 DIAGNOSIS — I10 Essential (primary) hypertension: Secondary | ICD-10-CM | POA: Diagnosis not present

## 2020-01-21 DIAGNOSIS — R42 Dizziness and giddiness: Secondary | ICD-10-CM

## 2020-01-21 MED ORDER — ASPIRIN 81 MG PO TBEC: DELAYED_RELEASE_TABLET | ORAL | 0 refills | Status: DC

## 2020-01-21 MED ORDER — IRBESARTAN 150 MG PO TABS
150.0000 mg | ORAL_TABLET | Freq: Every day | ORAL | 0 refills | Status: DC
Start: 2020-01-21 — End: 2020-02-20

## 2020-01-21 NOTE — Assessment & Plan Note (Addendum)
Uncontrolled. Will switch to irbesartan 150mg  to help with dizziness caused by valsartan. Patient will call cardiology to update them

## 2020-01-21 NOTE — Progress Notes (Signed)
    SUBJECTIVE:   CHIEF COMPLAINT / HPI:   Dizziness Patient reports when he takes valsartan he gets dizzy. States he told his cardiologist this. Says he has been taking this since 2013 and his body is used to it. Reports BP is elevated at home as well, similar to what it was in office today. Lowest BP he had at home was 125/72. Dizziness is worse right after taking valsartan. Patient will start Zio patch tonight, total of 2 weeks. Drinking plenty of water. Working on his diet with nutritionist.   Previously has tried hyzaar, carvedilol. Described dizziness as patient spinning. Recently decreased to valsartan 80mg  by cardiology   Seen on 5/27 for dizziness and is continued to be followed by cardiology. Recently  had zio patch to r/o arrythmia. Advised by cardiology to monitor BP closely   PERTINENT  PMH / PSH: T2DM, dizziness, hypertension   OBJECTIVE:   BP (!) 152/88   Pulse 77   Ht 5\' 10"  (1.778 m)   Wt (!) 391 lb 12.8 oz (177.7 kg)   SpO2 97%   BMI 56.22 kg/m   Gen: awake and alert, NAD HEENT: PERRL, EOMI, normal cover/uncover, no nystagmus  Cardio: RRR, no MRG Resp: CTAB, no wheezes rales or rhonchi GI: soft, non tender, non distended, bowel sounds present Ext: no edema Neuro: CN2-12 intact, able to stand without dizziness, normal HITS exam, normal gait   ASSESSMENT/PLAN:   Dizziness Patient's dizziness seems to be related to his valsartan use as he gets dizzy after taking medications. BP elevated without any medications. We will need to reach a compromise of BP control with medications that do not cause dizziness. Discuss switching to another ARB. Discussed with Dr. Valentina Lucks for dosage equivalent. Will switch to Irbesartan 150mg  to start. Advised f/u with PCP in 2 weeks to ensure dizziness improves. Can consider vestibular PT if no improvement given symptoms seem like vertigo. HITS exam wnl which is re-assuring.   Primary hypertension Uncontrolled. Will switch to irbesartan  150mg  to help with dizziness caused by valsartan. Patient will call cardiology to update them     Caroline More, [place]

## 2020-01-21 NOTE — Assessment & Plan Note (Signed)
Patient's dizziness seems to be related to his valsartan use as he gets dizzy after taking medications. BP elevated without any medications. We will need to reach a compromise of BP control with medications that do not cause dizziness. Discuss switching to another ARB. Discussed with Dr. Valentina Lucks for dosage equivalent. Will switch to Irbesartan 150mg  to start. Advised f/u with PCP in 2 weeks to ensure dizziness improves. Can consider vestibular PT if no improvement given symptoms seem like vertigo. HITS exam wnl which is re-assuring.

## 2020-01-21 NOTE — Patient Instructions (Addendum)
1. We have started a new blood pressure medicine, irbesartan.  2. Please inform you cardiologist of your dizziness with valsartan and that we switched you to irbesartan  3. Follow up in 2 weeks to ensure improvement  4. Consider vestibular PT if no improvement.  5. If worsening dizziness call our office 6. Check your blood pressures daily. Call our office in 1 week to inform your PCP of your blood pressure readings

## 2020-01-23 ENCOUNTER — Other Ambulatory Visit (INDEPENDENT_AMBULATORY_CARE_PROVIDER_SITE_OTHER): Payer: Medicaid Other

## 2020-01-23 DIAGNOSIS — R002 Palpitations: Secondary | ICD-10-CM

## 2020-01-23 NOTE — Telephone Encounter (Signed)
Form has been faxed by Jerene Pitch, per Jarrett Soho.

## 2020-01-27 ENCOUNTER — Telehealth: Payer: Self-pay | Admitting: Cardiology

## 2020-01-27 NOTE — Telephone Encounter (Signed)
Spoke to patient. He states he is unable to due his daily routines or sleep well due to  the Monitor  Irritating his skin.  Rn asked patient if he called  The  [employer]  Toll free number for support . Patient stated no , " My nurse told me I should call you first , because you are suppose to let the doctor know what is going on."  Patient states he has home health nurse who  Checks on him regular.   Patient states he will try to wear monitor as long as he can of the  monitor ( 14 days) . He states he will contact monitor  [employer]. Patient aware will seen message to MD.

## 2020-01-27 NOTE — Telephone Encounter (Signed)
Craig [name] is calling stating he is having a reaction to his monitor. He states it's itching and bothering him to the point he cannot sleep. He has worn it for 4 days, but would like some type of alternative.

## 2020-01-28 NOTE — Telephone Encounter (Signed)
Agree with plan 

## 2020-02-10 ENCOUNTER — Ambulatory Visit: Payer: Medicaid Other | Admitting: Family Medicine

## 2020-02-10 NOTE — Progress Notes (Signed)
° ° °  SUBJECTIVE:   CHIEF COMPLAINT / HPI:   Diabetes Current Regimen: Metformin 500 mg daily CBGs: 85-100  Last A1c: 5.6 on 10/15/19  Endorses polyuria from drinking a lot of water, and polydipsia. Denies hypoglycemia.  Last Eye Exam: 01/14/2018; pmhx of diabetic retinopathy next scheduled for 02/19/20 Statin: Atorvastatin 80 mg daily  ACE/ARB: irbesartan 150 mg daily    Hypertension: - Medications: Irbesartan 150 mg daily - Compliance: Yes - Checking BP at home: Yes, x1 daily avg. 145/88 - Endorse SOB with walking fast. Attributes this to his weight gain. and has has symptoms of hypotension in the past - Denies any CP, vision changes, LE edema, medication SEs, or symptoms of hypotension - Diet: Low sodium (brown rice salmon grilled chicken salads (olive oil dressings) and vegetable, a lot of water. Smoothie king smoothies.  - Exercise: Swimming x3 weekly, bowling x4 weekly, walks to mailbox 6 blocks up a hill and gets short of breath  Dizziness Improved from last visit since medication change. Is about a 4-5/10. No vision changes.   Health Maintenance reviewed - colonoscopy scheduled in July according to patient, COVID vaccine- patient plans to get does not know when  PERTINENT  PMH / PSH: Orthostatic hypotension, hx of gastric cancer, hyperlipidemia, anemia (last hgb 11.8 11/20/19), morbid obesity   OBJECTIVE:   Orthostatic VS for the past 24 hrs:  BP- Lying Pulse- Lying BP- Sitting Pulse- Sitting BP- Standing at 0 minutes Pulse- Standing at 0 minutes  02/11/20 1514 (!) 179/95 67 (!) 175/98 76 (!) 178/99 80   BP (!) 148/92    Pulse 68    Wt (!) 412 lb (186.9 kg)    SpO2 96%    BMI 59.12 kg/m   General: Appears well, no acute distress. Age appropriate.  Cardiac: RRR, normal heart sounds, no murmurs Respiratory: CTAB, normal effort Abdomen: soft, nontender, nondistended, +bowel sounds Extremities: BLE pitting edema. No cyanosis. WWP. Posterior tibial pulses  palpable.  ASSESSMENT/PLAN:   Controlled type 2 diabetes mellitus without complication, without long-term current use of insulin (HCC) -Obtain hemoglobin A1c; future order placed patient could not wait for lab work -Consider discontinuing metformin -Consider restarting victoza for added benefit of weight loss; He has gain 21 lbs in 3 weeks. This could be scale error or excessive weight gain due to some other factor.  -nutrition appt 02/24/2020   Dizziness Has improved with medication changes. Negative orthostatic vitals today. Endorses dyspnea on exertion. I believe there continue to be a component of this that is linked to his known anemia. Needs a colonoscopy. States he is scheduled for the exam in July. Will follow up these results. Patient was encouraged to continue to follow up with Cardiology (next appt 04/13/20) was previously on a holter monitor. Results are pending. Will send secure message to cardiology suggesting a functional/exercise echo. Prior echo from 01/05/20 was normal.  -Obtain repeat CBC; future odor placed  Primary hypertension Not well controlled today in office. Likely arm/cuff discrepancy. Recheck BP 148/92. About what it averages at home. Patient keeps a diary of daily BPs. Encouraged to continue to do and take medication as prescribed. Recently discontinue BP medications d/t hypotension. Can continue to add medications back slowly if better control needed. Patients goal of weight loss will also improve his blood pressure.   Gerlene Fee, [place]

## 2020-02-11 ENCOUNTER — Ambulatory Visit (INDEPENDENT_AMBULATORY_CARE_PROVIDER_SITE_OTHER): Payer: Medicaid Other | Admitting: Family Medicine

## 2020-02-11 ENCOUNTER — Other Ambulatory Visit: Payer: Self-pay

## 2020-02-11 ENCOUNTER — Encounter: Payer: Self-pay | Admitting: Family Medicine

## 2020-02-11 VITALS — BP 148/92 | HR 68 | Wt >= 6400 oz

## 2020-02-11 DIAGNOSIS — R0602 Shortness of breath: Secondary | ICD-10-CM | POA: Diagnosis not present

## 2020-02-11 DIAGNOSIS — R42 Dizziness and giddiness: Secondary | ICD-10-CM | POA: Diagnosis not present

## 2020-02-11 DIAGNOSIS — I10 Essential (primary) hypertension: Secondary | ICD-10-CM | POA: Diagnosis not present

## 2020-02-11 DIAGNOSIS — D649 Anemia, unspecified: Secondary | ICD-10-CM

## 2020-02-11 DIAGNOSIS — E119 Type 2 diabetes mellitus without complications: Secondary | ICD-10-CM | POA: Diagnosis not present

## 2020-02-11 NOTE — Patient Instructions (Addendum)
It was very nice to see you today.  Please enjoy the rest of your week.  Continue your current exercise regimen Do not skip meals. Add fuel (food) to your fire (metabolism). Your current diet of lean meats veggies and water sounds good. Keep your nutrition appointment this is going to be beneficial I will call you with your labs if abnormal otherwise I will notify you by mail if normal.  Keep your colonoscopy and eye appointment. Continue to follow up with cardiology.  Follow up in 3 months for diabetes check or sooner if needed.   Please call the clinic at [phone] if your symptoms worsen or you have any concerns. It was our pleasure to serve you.

## 2020-02-12 ENCOUNTER — Other Ambulatory Visit: Payer: Medicaid Other

## 2020-02-12 ENCOUNTER — Ambulatory Visit: Payer: Medicaid Other

## 2020-02-12 NOTE — Assessment & Plan Note (Addendum)
Has improved with medication changes. Negative orthostatic vitals today. Endorses dyspnea on exertion. I believe there continue to be a component of this that is linked to his known anemia. Needs a colonoscopy. States he is scheduled for the exam in July. Will follow up these results. Patient was encouraged to continue to follow up with Cardiology (next appt 04/13/20) was previously on a holter monitor. Results are pending. Will send secure message to cardiology suggesting a functional/exercise echo. Prior echo from 01/05/20 was normal.  -Obtain repeat CBC; future odor placed

## 2020-02-12 NOTE — Assessment & Plan Note (Signed)
Not well controlled today in office. Likely arm/cuff discrepancy. Recheck BP 148/92. About what it averages at home. Patient keeps a diary of daily BPs. Encouraged to continue to do and take medication as prescribed. Recently discontinue BP medications d/t hypotension. Can continue to add medications back slowly if better control needed. Patients goal of weight loss will also improve his blood pressure.

## 2020-02-12 NOTE — Assessment & Plan Note (Addendum)
-  Obtain hemoglobin A1c; future order placed patient could not wait for lab work -Consider discontinuing metformin -Consider restarting victoza for added benefit of weight loss; He has gain 21 lbs in 3 weeks. This could be scale error or excessive weight gain due to some other factor.  -nutrition appt 02/24/2020

## 2020-02-13 ENCOUNTER — Other Ambulatory Visit: Payer: Medicaid Other

## 2020-02-19 ENCOUNTER — Other Ambulatory Visit: Payer: Medicaid Other

## 2020-02-19 ENCOUNTER — Other Ambulatory Visit: Payer: Self-pay

## 2020-02-19 ENCOUNTER — Other Ambulatory Visit: Payer: Self-pay | Admitting: Family Medicine

## 2020-02-19 DIAGNOSIS — E119 Type 2 diabetes mellitus without complications: Secondary | ICD-10-CM | POA: Diagnosis not present

## 2020-02-19 DIAGNOSIS — R0602 Shortness of breath: Secondary | ICD-10-CM | POA: Diagnosis not present

## 2020-02-19 DIAGNOSIS — D649 Anemia, unspecified: Secondary | ICD-10-CM | POA: Diagnosis not present

## 2020-02-19 DIAGNOSIS — R42 Dizziness and giddiness: Secondary | ICD-10-CM

## 2020-02-20 ENCOUNTER — Telehealth: Payer: Self-pay

## 2020-02-20 LAB — HEMOGLOBIN A1C
Est. average glucose Bld gHb Est-mCnc: 134 mg/dL
Hgb A1c MFr Bld: 6.3 % — ABNORMAL HIGH (ref 4.8–5.6)

## 2020-02-20 LAB — CBC
Hematocrit: 36.1 % — ABNORMAL LOW (ref 37.5–51.0)
Hemoglobin: 11.3 g/dL — ABNORMAL LOW (ref 13.0–17.7)
MCH: 28 pg (ref 26.6–33.0)
MCHC: 31.3 g/dL — ABNORMAL LOW (ref 31.5–35.7)
MCV: 89 fL (ref 79–97)
Platelets: 248 10*3/uL (ref 150–450)
RBC: 4.04 x10E6/uL — ABNORMAL LOW (ref 4.14–5.80)
RDW: 14.2 % (ref 11.6–15.4)
WBC: 6.9 10*3/uL (ref 3.4–10.8)

## 2020-02-20 NOTE — Telephone Encounter (Signed)
Called patient and informed him of his results. Also passed on recommendation to patient to keep his appointment for colonoscopy.  Patient states that he no longer has the information for his upcoming colonsscopy. Not sure which one he was referred too.   Patient is riding [employer] transportation and not able to write down any numbers now.  If patient calls back, please give him the numbers below to see if he can call around to see where he is scheduled.  [facility]- [phone]  [place]  [facility]- [phone]  .Ozella Almond, CMA

## 2020-02-24 ENCOUNTER — Ambulatory Visit: Payer: Medicaid Other | Admitting: Registered"

## 2020-03-02 ENCOUNTER — Ambulatory Visit: Payer: Medicaid Other | Admitting: Pharmacist

## 2020-03-03 ENCOUNTER — Ambulatory Visit (INDEPENDENT_AMBULATORY_CARE_PROVIDER_SITE_OTHER): Payer: Medicaid Other | Admitting: Cardiology

## 2020-03-03 ENCOUNTER — Other Ambulatory Visit: Payer: Self-pay

## 2020-03-03 ENCOUNTER — Encounter: Payer: Self-pay | Admitting: Cardiology

## 2020-03-03 VITALS — BP 140/84 | HR 76 | Ht 70.0 in | Wt >= 6400 oz

## 2020-03-03 DIAGNOSIS — I10 Essential (primary) hypertension: Secondary | ICD-10-CM | POA: Diagnosis not present

## 2020-03-03 DIAGNOSIS — I493 Ventricular premature depolarization: Secondary | ICD-10-CM

## 2020-03-03 DIAGNOSIS — R0609 Other forms of dyspnea: Secondary | ICD-10-CM

## 2020-03-03 DIAGNOSIS — E785 Hyperlipidemia, unspecified: Secondary | ICD-10-CM | POA: Diagnosis not present

## 2020-03-03 DIAGNOSIS — R0600 Dyspnea, unspecified: Secondary | ICD-10-CM

## 2020-03-03 DIAGNOSIS — R42 Dizziness and giddiness: Secondary | ICD-10-CM

## 2020-03-03 MED ORDER — METOPROLOL SUCCINATE ER 25 MG PO TB24
25.0000 mg | ORAL_TABLET | Freq: Every day | ORAL | 3 refills | Status: DC
Start: 2020-03-03 — End: 2020-04-13

## 2020-03-03 NOTE — Progress Notes (Signed)
Cardiology Office Note:    Date:  03/06/2020   ID:  Craig [name], DOB [birth year], MRN [id number]  PCP:  Gerlene Fee, DO  Cardiologist:  Donato Heinz, MD  Electrophysiologist:  None   Referring MD: Gerlene Fee, DO   Chief Complaint  Patient presents with  . Irregular Heart Beat    History of Present Illness:    Craig [name] is a 59 y.o. male with a hx of gastric cancer, T2DM, GERD, morbid obesity, hyperlipidemia, hypertension who presents for follow-up.  He was referred by Dr. Ardelia Mems for evaluation of orthostatic hypotension, initially seen on 01/07/2020.  Victoza, gabapentin, valsartan, and carvedilol were discontinued.  He reports he has been having dizziness x3 to 4 weeks.  Occurring multiple times a day.  States that sometimes feels like room is spinning and sometimes just feels lightheaded.  Denies any syncopal episode.  Does report occasional palpitations where he feels like his heart is racing.  He cannot identify cause of dizziness.  Denies relationship to position change.  Does report some improvement since stopping medications.  States that prior to the pandemic, he was bowling 4 times per week and swimming 3 times per week.  Has gained 80 pounds since the pandemic started.  He now has a total gym and does exercises every day for 20 minutes.  He denies any chest pain or dyspnea.  Quit smoking years ago.  Father had MI at age 62.  TTE on 01/05/2020 showed LVEF 55 to 60%, normal RV function, no significant valvular disease.  Since last clinic visit, he reports that he has been doing well.  He has been doing water aerobics and swimming a few laps 3 times per week.  He denies any chest pain but does have some shortness of breath with this.  Continues to have intermittent lightheadedness, but improved.  He denies any palpitations recently.  Reports his BP has been 130s to 140s over 70s to 80s when he checks at home.   Wt Readings from Last 3 Encounters:    03/03/20 (!) 400 lb 6.4 oz (181.6 kg)  02/11/20 (!) 412 lb (186.9 kg)  01/21/20 (!) 391 lb 12.8 oz (177.7 kg)     Past Medical History:  Diagnosis Date  . Anemia 12/15/2014  . Anxiety and depression 08/17/2017  . Bilateral carpal tunnel syndrome 08/03/2016  . Cancer ([place]) abdomen  . Controlled type 2 diabetes mellitus without complication, without long-term current use of insulin ([place]) 09/17/2012   Well-controlled.  No history of insulin use.  . Dizziness 03/27/2012  . GERD (gastroesophageal reflux disease) 03/27/2012  . History of gastric cancer 02/14/2012   Followed by Dr. Wynetta Emery in [place].   . Hyperlipidemia associated with type 2 diabetes mellitus ([place]) 01/01/2013  . Morbid obesity with BMI of 50.0-59.9, adult ([place]) 06/05/2012  . Onychomycosis 08/27/2018  . Orthostatic hypotension 11/10/2019  . Perioral numbness 07/30/2018  . Poor dentition 12/02/2018  . Primary hypertension 04/30/2012  . Right hand paresthesia 03/26/2017    Past Surgical History:  Procedure Laterality Date  . ABDOMINAL SURGERY      Current Medications: Current Meds  Medication Sig  . Accu-Chek Softclix Lancets lancets USE UP TO 3 TIMES DAILY TO CHECK BLOOD SUGAR  . antiseptic oral rinse (BIOTENE) LIQD 15 mLs by Mouth Rinse route as needed for dry mouth.  Marland Kitchen aspirin 81 MG EC tablet TAKE 1 TABLET BY MOUTH EVERY DAY  . atorvastatin (LIPITOR) 80 MG tablet TAKE 1 TABLET BY MOUTH  EVERY DAY  . glucose blood (ACCU-CHEK AVIVA PLUS) test strip 1 EACH BY OTHER ROUTE 4 (FOUR) TIMES DAILY - BEFORE MEALS AND AT BEDTIME. USE AS INSTRUCTED  . irbesartan (AVAPRO) 150 MG tablet TAKE 1 TABLET BY MOUTH AT BEDTIME.  . metFORMIN (GLUCOPHAGE-XR) 500 MG 24 hr tablet Take 1 tablet (500 mg total) by mouth daily with breakfast.  . Multiple Vitamins-Minerals (VITAMINS TO GO MEN) MISC Take 1 each by mouth in the morning, at noon, and at bedtime. Take 1 GOLI supplement three time daily  . pantoprazole (PROTONIX) 40 MG tablet TAKE 1 TABLET BY  MOUTH EVERY DAY  . terbinafine (LAMISIL AT) 1 % cream Apply 1 application topically 2 (two) times daily. (Patient taking differently: Apply 1 application topically daily. )     Allergies:   Patient has no known allergies.   Social History   Socioeconomic History  . Marital status: Single    Spouse name: Not on file  . Number of children: Not on file  . Years of education: Not on file  . Highest education level: Not on file  Occupational History  . Not on file  Tobacco Use  . Smoking status: Former Smoker    Start date: 08/14/1977    Quit date: 08/14/1996    Years since quitting: 23.5  . Smokeless tobacco: Never Used  Substance and Sexual Activity  . Alcohol use: No  . Drug use: Yes    Types: Cocaine, Marijuana, Heroin    Comment: Previous Hx of substance abuse  . Sexual activity: Yes  Other Topics Concern  . Not on file  Social History Narrative  . Not on file   Social Determinants of Health   Financial Resource Strain:   . Difficulty of Paying Living Expenses:   Food Insecurity:   . Worried About Charity fundraiser in the Last Year:   . Arboriculturist in the Last Year:   Transportation Needs:   . Film/video editor (Medical):   Marland Kitchen Lack of Transportation (Non-Medical):   Physical Activity:   . Days of Exercise per Week:   . Minutes of Exercise per Session:   Stress:   . Feeling of Stress :   Social Connections:   . Frequency of Communication with Friends and Family:   . Frequency of Social Gatherings with Friends and Family:   . Attends Religious Services:   . Active Member of Clubs or Organizations:   . Attends Archivist Meetings:   Marland Kitchen Marital Status:      Family History: [name] family history includes Diabetes type II in his mother.  ROS:   Please see the history of present illness.     All other systems reviewed and are negative.  EKGs/Labs/Other Studies Reviewed:    The following studies were reviewed today:   EKG:  EKG is  ordered today.  The ekg ordered today demonstrates normal sinus rhythm, rate 76, PVC, no ST/T abnormality  TTE 01/05/20: 1. Left ventricular ejection fraction, by estimation, is 55 to 60%. The  left ventricle has normal function. The left ventricle has no regional  wall motion abnormalities. Left ventricular diastolic parameters were  normal.  2. Right ventricular systolic function is normal. The right ventricular  size is normal. Tricuspid regurgitation signal is inadequate for assessing  PA pressure.  3. The mitral valve is grossly normal. No evidence of mitral valve  regurgitation. No evidence of mitral stenosis.  4. The aortic valve is tricuspid.  Aortic valve regurgitation is not  visualized. Mild to moderate aortic valve sclerosis/calcification is  present, without any evidence of aortic stenosis.  5. The inferior vena cava is normal in size with greater than 50%  respiratory variability, suggesting right atrial pressure of 3 mmHg.   Recent Labs: 11/20/2019: ALT 38; TSH 0.677 01/08/2020: BUN 9; Creatinine, Ser 1.04; Potassium 4.3; Sodium 140 02/19/2020: Hemoglobin 11.3; Platelets 248  Recent Lipid Panel    Component Value Date/Time   CHOL 144 04/11/2019 1506   TRIG 78 04/11/2019 1506   HDL 43 04/11/2019 1506   CHOLHDL 3.3 04/11/2019 1506   CHOLHDL 4.3 06/16/2015 0921   VLDL 13 06/16/2015 0921   LDLCALC 85 04/11/2019 1506   LDLDIRECT 90 07/13/2017 1606   LDLDIRECT 88 02/04/2016 1115    Physical Exam:    VS:  BP 140/84   Pulse 76   Ht 5\' 10"  (1.778 m)   Wt (!) 400 lb 6.4 oz (181.6 kg)   SpO2 95%   BMI 57.45 kg/m     Wt Readings from Last 3 Encounters:  03/03/20 (!) 400 lb 6.4 oz (181.6 kg)  02/11/20 (!) 412 lb (186.9 kg)  01/21/20 (!) 391 lb 12.8 oz (177.7 kg)     GEN:  n no acute distress HEENT: Normal NECK: No JVD; No carotid bruits LYMPHATICS: No lymphadenopathy CARDIAC:RRR, no murmurs, distant heart sounds RESPIRATORY:  Clear to auscultation without  rales, wheezing or rhonchi  ABDOMEN: Soft, non-tender, non-distended MUSCULOSKELETAL:  No edema; No deformity  SKIN: Warm and dry NEUROLOGIC:  Alert and oriented x 3 PSYCHIATRIC:  Normal affect   ASSESSMENT:    1. Frequent PVCs   2. DOE (dyspnea on exertion)   3. Lightheadedness   4. Hypertension, unspecified type   5. Hyperlipidemia, unspecified hyperlipidemia type   6. Morbid obesity ([place])    PLAN:    Frequent PVCs: 12% of beats on Zio patch on 02/20/2020.  Will start Toprol-XL 25 mg daily.  Dyspnea: Suspect deconditioning contributing, but could represent anginal equivalent.  Discussed stress test, but patient would like to hold off at this time as he is exercising more with water aerobics and swimming and feels his symptoms are improving.  We will continue to monitor and if continues to have shortness of breath with improvement in his conditioning, will plan for [facility].  Lightheadedness: Unclear cause.  Orthostatics in clinic were negative for drop in blood pressure, but did have significant increase in heart rate.  Could represent POTS.  TTE shows no structural heart disease.  Given palpitations and PVCs on EKG, Zio patch x4 days was checked and showed frequent PVCs (12% of beats).  Started on Toprol-XL as above.  Hypertension: On irbesartan 150 mg daily. Mildly elevated in clinic today, adding Toprol-XL 25 mg daily as above.  Hyperlipidemia: On atorvastatin 80 mg daily.  LDL 85 on 04/11/2019  T2DM: On Metformin 500 mg daily.  A1c 5.6 on 10/15/2019  Morbid obesity:Body mass index is 57.45 kg/m.  Diet and exercise encouraged  RTC in 1 month   Medication Adjustments/Labs and Tests Ordered: Current medicines are reviewed at length with the patient today.  Concerns regarding medicines are outlined above.  Orders Placed This Encounter  Procedures  . EKG 12-Lead   Meds ordered this encounter  Medications  . metoprolol succinate (TOPROL XL) 25 MG 24 hr tablet    Sig:  Take 1 tablet (25 mg total) by mouth daily.    Dispense:  30 tablet  Refill:  3    Patient Instructions  Medication Instructions:  START metoprolol succinate (Toprol XL) 25 mg daily  *If you need a refill on your cardiac medications before your next appointment, please call your pharmacy*  Follow-Up: At [facility], you and your health needs are our priority.  As part of our continuing mission to provide you with exceptional heart care, we have created designated Provider Care Teams.  These Care Teams include your primary Cardiologist (physician) and Advanced Practice Providers (APPs -  Physician Assistants and Nurse Practitioners) who all work together to provide you with the care you need, when you need it.  We recommend signing up for the patient portal called "MyChart".  Sign up information is provided on this After Visit Summary.  MyChart is used to connect with patients for Virtual Visits (Telemedicine).  Patients are able to view lab/test results, encounter notes, upcoming appointments, etc.  Non-urgent messages can be sent to your provider as well.   To learn more about what you can do with MyChart, go to [email or web address].    Your next appointment:    as scheduled 8/31 with Dr. Gardiner Rhyme       Signed, Donato Heinz, MD  03/06/2020 11:51 AM    [place]

## 2020-03-03 NOTE — Patient Instructions (Signed)
Medication Instructions:  START metoprolol succinate (Toprol XL) 25 mg daily  *If you need a refill on your cardiac medications before your next appointment, please call your pharmacy*  Follow-Up: At [facility], you and your health needs are our priority.  As part of our continuing mission to provide you with exceptional heart care, we have created designated Provider Care Teams.  These Care Teams include your primary Cardiologist (physician) and Advanced Practice Providers (APPs -  Physician Assistants and Nurse Practitioners) who all work together to provide you with the care you need, when you need it.  We recommend signing up for the patient portal called "MyChart".  Sign up information is provided on this After Visit Summary.  MyChart is used to connect with patients for Virtual Visits (Telemedicine).  Patients are able to view lab/test results, encounter notes, upcoming appointments, etc.  Non-urgent messages can be sent to your provider as well.   To learn more about what you can do with MyChart, go to [email or web address].    Your next appointment:    as scheduled 8/31 with Dr. Gardiner Rhyme

## 2020-03-24 ENCOUNTER — Other Ambulatory Visit: Payer: Self-pay | Admitting: Family Medicine

## 2020-04-11 NOTE — Progress Notes (Addendum)
Cardiology Office Note:    Date:  04/14/2020   ID:  Craig [name], DOB 1960-11-17, MRN [id number]  PCP:  Gerlene Fee, DO  Cardiologist:  Donato Heinz, MD  Electrophysiologist:  None   Referring MD: Gerlene Fee, DO   Chief Complaint  Patient presents with  . Shortness of Breath    History of Present Illness:    Craig [name] is a 59 y.o. male with a hx of gastric cancer, T2DM, GERD, morbid obesity, hyperlipidemia, hypertension who presents for follow-up.  He was referred by Dr. Ardelia Mems for evaluation of orthostatic hypotension, initially seen on 01/07/2020.  Victoza, gabapentin, valsartan, and carvedilol were discontinued.  He reports he has been having dizziness x3 to 4 weeks.  Occurring multiple times a day.  States that sometimes feels like room is spinning and sometimes just feels lightheaded.  Denies any syncopal episode.  Does report occasional palpitations where he feels like his heart is racing.  He cannot identify cause of dizziness.  Denies relationship to position change.  Does report some improvement since stopping medications.  States that prior to the pandemic, he was bowling 4 times per week and swimming 3 times per week.  Has gained 80 pounds since the pandemic started.  Quit smoking years ago.  Father had MI at age 51.  TTE on 01/05/2020 showed LVEF 55 to 60%, normal RV function, no significant valvular disease.  Zio patch x4 days on 02/20/2020 showed frequent PVCs (12% of beats).  Started on Toprol-XL 25 mg daily.  Since last clinic visit, he reports that he has been doing OK.  Has started bowling.  No longer doing water aerobics or swimming.  Continues to have dyspnea with exertion.  Denies any chest pain. Lightheadedness has improved.  Does reports he snores.   Wt Readings from Last 3 Encounters:  04/13/20 (!) 405 lb (183.7 kg)  03/03/20 (!) 400 lb 6.4 oz (181.6 kg)  02/11/20 (!) 412 lb (186.9 kg)     Past Medical History:  Diagnosis Date  .  Anemia 12/15/2014  . Anxiety and depression 08/17/2017  . Bilateral carpal tunnel syndrome 08/03/2016  . Cancer ([place]) abdomen  . Controlled type 2 diabetes mellitus without complication, without long-term current use of insulin ([place]) 09/17/2012   Well-controlled.  No history of insulin use.  . Dizziness 03/27/2012  . GERD (gastroesophageal reflux disease) 03/27/2012  . History of gastric cancer 02/14/2012   Followed by Dr. Wynetta Emery in [place].   . Hyperlipidemia associated with type 2 diabetes mellitus ([place]) 01/01/2013  . Morbid obesity with BMI of 50.0-59.9, adult ([place]) 06/05/2012  . Onychomycosis 08/27/2018  . Orthostatic hypotension 11/10/2019  . Perioral numbness 07/30/2018  . Poor dentition 12/02/2018  . Primary hypertension 04/30/2012  . Right hand paresthesia 03/26/2017    Past Surgical History:  Procedure Laterality Date  . ABDOMINAL SURGERY      Current Medications: Current Meds  Medication Sig  . Accu-Chek Softclix Lancets lancets USE UP TO 3 TIMES DAILY TO CHECK BLOOD SUGAR  . antiseptic oral rinse (BIOTENE) LIQD 15 mLs by Mouth Rinse route as needed for dry mouth.  Marland Kitchen aspirin 81 MG EC tablet TAKE 1 TABLET BY MOUTH EVERY DAY  . atorvastatin (LIPITOR) 80 MG tablet TAKE 1 TABLET BY MOUTH EVERY DAY  . glucose blood (ACCU-CHEK AVIVA PLUS) test strip 1 EACH BY OTHER ROUTE 4 (FOUR) TIMES DAILY - BEFORE MEALS AND AT BEDTIME. USE AS INSTRUCTED  . irbesartan (AVAPRO) 150 MG tablet TAKE  1 TABLET BY MOUTH EVERYDAY AT BEDTIME  . metFORMIN (GLUCOPHAGE-XR) 500 MG 24 hr tablet Take 1 tablet (500 mg total) by mouth daily with breakfast.  . metoprolol succinate (TOPROL XL) 50 MG 24 hr tablet Take 1 tablet (50 mg total) by mouth daily.  . Multiple Vitamins-Minerals (VITAMINS TO GO MEN) MISC Take 1 each by mouth in the morning, at noon, and at bedtime. Take 1 GOLI supplement three time daily  . pantoprazole (PROTONIX) 40 MG tablet TAKE 1 TABLET BY MOUTH EVERY DAY  . terbinafine (LAMISIL AT) 1 % cream  Apply 1 application topically 2 (two) times daily. (Patient taking differently: Apply 1 application topically daily. )  . [DISCONTINUED] metoprolol succinate (TOPROL XL) 25 MG 24 hr tablet Take 1 tablet (25 mg total) by mouth daily.     Allergies:   Patient has no known allergies.   Social History   Socioeconomic History  . Marital status: Single    Spouse name: Not on file  . Number of children: Not on file  . Years of education: Not on file  . Highest education level: Not on file  Occupational History  . Not on file  Tobacco Use  . Smoking status: Former Smoker    Start date: 08/14/1977    Quit date: 08/14/1996    Years since quitting: 23.6  . Smokeless tobacco: Never Used  Substance and Sexual Activity  . Alcohol use: No  . Drug use: Yes    Types: Cocaine, Marijuana, Heroin    Comment: Previous Hx of substance abuse  . Sexual activity: Yes  Other Topics Concern  . Not on file  Social History Narrative  . Not on file   Social Determinants of Health   Financial Resource Strain:   . Difficulty of Paying Living Expenses: Not on file  Food Insecurity:   . Worried About Charity fundraiser in the Last Year: Not on file  . Ran Out of Food in the Last Year: Not on file  Transportation Needs:   . Lack of Transportation (Medical): Not on file  . Lack of Transportation (Non-Medical): Not on file  Physical Activity:   . Days of Exercise per Week: Not on file  . Minutes of Exercise per Session: Not on file  Stress:   . Feeling of Stress : Not on file  Social Connections:   . Frequency of Communication with Friends and Family: Not on file  . Frequency of Social Gatherings with Friends and Family: Not on file  . Attends Religious Services: Not on file  . Active Member of Clubs or Organizations: Not on file  . Attends Archivist Meetings: Not on file  . Marital Status: Not on file     Family History: [name] family history includes Diabetes type II in his  mother.  ROS:   Please see the history of present illness.     All other systems reviewed and are negative.  EKGs/Labs/Other Studies Reviewed:    The following studies were reviewed today:   EKG:  EKG is ordered today.  The ekg ordered today demonstrates normal sinus rhythm, frequent PVCs, bigeminy, <5mm ST depressions in inferior leads   TTE 01/05/20: 1. Left ventricular ejection fraction, by estimation, is 55 to 60%. The  left ventricle has normal function. The left ventricle has no regional  wall motion abnormalities. Left ventricular diastolic parameters were  normal.  2. Right ventricular systolic function is normal. The right ventricular  size is normal.  Tricuspid regurgitation signal is inadequate for assessing  PA pressure.  3. The mitral valve is grossly normal. No evidence of mitral valve  regurgitation. No evidence of mitral stenosis.  4. The aortic valve is tricuspid. Aortic valve regurgitation is not  visualized. Mild to moderate aortic valve sclerosis/calcification is  present, without any evidence of aortic stenosis.  5. The inferior vena cava is normal in size with greater than 50%  respiratory variability, suggesting right atrial pressure of 3 mmHg.   Recent Labs: 11/20/2019: ALT 38; TSH 0.677 01/08/2020: BUN 9; Creatinine, Ser 1.04; Potassium 4.3; Sodium 140 02/19/2020: Hemoglobin 11.3; Platelets 248  Recent Lipid Panel    Component Value Date/Time   CHOL 144 04/11/2019 1506   TRIG 78 04/11/2019 1506   HDL 43 04/11/2019 1506   CHOLHDL 3.3 04/11/2019 1506   CHOLHDL 4.3 06/16/2015 0921   VLDL 13 06/16/2015 0921   LDLCALC 85 04/11/2019 1506   LDLDIRECT 90 07/13/2017 1606   LDLDIRECT 88 02/04/2016 1115    Physical Exam:    VS:  BP (!) 134/98   Pulse 74   Ht 5\' 10"  (1.778 m)   Wt (!) 405 lb (183.7 kg)   SpO2 97%   BMI 58.11 kg/m     Wt Readings from Last 3 Encounters:  04/13/20 (!) 405 lb (183.7 kg)  03/03/20 (!) 400 lb 6.4 oz (181.6 kg)   02/11/20 (!) 412 lb (186.9 kg)     GEN:  n no acute distress HEENT: Normal NECK: No JVD; No carotid bruits LYMPHATICS: No lymphadenopathy CARDIAC:RRR, no murmurs, distant heart sounds RESPIRATORY:  Clear to auscultation without rales, wheezing or rhonchi  ABDOMEN: Soft, non-tender, non-distended MUSCULOSKELETAL:  No edema; No deformity  SKIN: Warm and dry NEUROLOGIC:  Alert and oriented x 3 PSYCHIATRIC:  Normal affect   ASSESSMENT:    1. Frequent PVCs   2. DOE (dyspnea on exertion)   3. Snoring   4. Lightheadedness   5. Hypertension, unspecified type   6. Hyperlipidemia, unspecified hyperlipidemia type   7. Morbid obesity ([place])    PLAN:    Frequent PVCs: 12% of beats on Zio patch on 02/20/2020.  In bigeminy in clinic today.  Will increase Toprol-XL to 50 mg daily  Dyspnea: Suspect deconditioning contributing, but could represent anginal equivalent.  [facility].  Lightheadedness: Unclear cause.  Orthostatics in clinic were negative for drop in blood pressure, but did have significant increase in heart rate.  Could represent POTS.  TTE shows no structural heart disease.  Given palpitations and PVCs on EKG, Zio patch x4 days was checked and showed frequent PVCs (12% of beats).  Started on Toprol-XL as above.  Hypertension: On irbesartan 150 mg daily. Mildly elevated in clinic today, increasing Toprol-XL as above  Hyperlipidemia: On atorvastatin 80 mg daily.  LDL 85 on 04/11/2019  T2DM: On Metformin 500 mg daily.  A1c 5.6 on 10/15/2019  Morbid obesity:Body mass index is 58.11 kg/m.  Diet and exercise encouraged  Snoring: Will check sleep study  RTC in 3 months  Shared Decision Making/Informed Consent The risks [chest pain, shortness of breath, cardiac arrhythmias, dizziness, blood pressure fluctuations, myocardial infarction, stroke/transient ischemic attack, nausea, vomiting, allergic reaction, radiation exposure, metallic taste sensation and  life-threatening complications (estimated to be 1 in 10,000)], benefits (risk stratification, diagnosing coronary artery disease, treatment guidance) and alternatives of a nuclear stress test were discussed in detail with Craig [name] and he agrees to proceed.  Medication Adjustments/Labs and Tests Ordered: Current medicines are reviewed at length with the patient today.  Concerns regarding medicines are outlined above.  Orders Placed This Encounter  Procedures  . MYOCARDIAL PERFUSION IMAGING  . EKG 12-Lead  . Split night study   Meds ordered this encounter  Medications  . metoprolol succinate (TOPROL XL) 50 MG 24 hr tablet    Sig: Take 1 tablet (50 mg total) by mouth daily.    Dispense:  90 tablet    Refill:  3    Patient Instructions  Medication Instructions:  INCREASE metoprolol succinate (Toprol XL) to 50 mg daily  *If you need a refill on your cardiac medications before your next appointment, please call your pharmacy*  Testing/Procedures: Your physician has requested that you have a (2 day) lexiscan myoview. For further information please visit [email or web address]. Please follow instruction sheet, as given. --[place] has recommended that you have a sleep study. This test records several body functions during sleep, including: brain activity, eye movement, oxygen and carbon dioxide blood levels, heart rate and rhythm, breathing rate and rhythm, the flow of air through your mouth and nose, snoring, body muscle movements, and chest and belly movement.  Follow-Up: At [facility], you and your health needs are our priority.  As part of our continuing mission to provide you with exceptional heart care, we have created designated Provider Care Teams.  These Care Teams include your primary Cardiologist (physician) and Advanced Practice Providers (APPs -  Physician Assistants and Nurse Practitioners) who all work together to provide you with the care you  need, when you need it.  We recommend signing up for the patient portal called "MyChart".  Sign up information is provided on this After Visit Summary.  MyChart is used to connect with patients for Virtual Visits (Telemedicine).  Patients are able to view lab/test results, encounter notes, upcoming appointments, etc.  Non-urgent messages can be sent to your provider as well.   To learn more about what you can do with MyChart, go to [email or web address].    Your next appointment:   3 month(s)  The format for your next appointment:   In Person  Provider:   Oswaldo Milian, MD       Signed, Donato Heinz, MD  04/14/2020 8:15 PM    [place]

## 2020-04-13 ENCOUNTER — Other Ambulatory Visit: Payer: Self-pay

## 2020-04-13 ENCOUNTER — Encounter: Payer: Self-pay | Admitting: Cardiology

## 2020-04-13 ENCOUNTER — Ambulatory Visit (INDEPENDENT_AMBULATORY_CARE_PROVIDER_SITE_OTHER): Payer: Medicaid Other | Admitting: Cardiology

## 2020-04-13 VITALS — BP 134/98 | HR 74 | Ht 70.0 in | Wt >= 6400 oz

## 2020-04-13 DIAGNOSIS — I10 Essential (primary) hypertension: Secondary | ICD-10-CM | POA: Diagnosis not present

## 2020-04-13 DIAGNOSIS — R42 Dizziness and giddiness: Secondary | ICD-10-CM | POA: Diagnosis not present

## 2020-04-13 DIAGNOSIS — R0600 Dyspnea, unspecified: Secondary | ICD-10-CM | POA: Diagnosis not present

## 2020-04-13 DIAGNOSIS — R0683 Snoring: Secondary | ICD-10-CM | POA: Diagnosis not present

## 2020-04-13 DIAGNOSIS — E785 Hyperlipidemia, unspecified: Secondary | ICD-10-CM | POA: Diagnosis not present

## 2020-04-13 DIAGNOSIS — I493 Ventricular premature depolarization: Secondary | ICD-10-CM | POA: Diagnosis not present

## 2020-04-13 DIAGNOSIS — R0609 Other forms of dyspnea: Secondary | ICD-10-CM

## 2020-04-13 MED ORDER — METOPROLOL SUCCINATE ER 50 MG PO TB24
50.0000 mg | ORAL_TABLET | Freq: Every day | ORAL | 3 refills | Status: DC
Start: 2020-04-13 — End: 2020-11-17

## 2020-04-13 NOTE — Patient Instructions (Signed)
Medication Instructions:  INCREASE metoprolol succinate (Toprol XL) to 50 mg daily  *If you need a refill on your cardiac medications before your next appointment, please call your pharmacy*  Testing/Procedures: Your physician has requested that you have a (2 day) lexiscan myoview. For further information please visit [email or web address]. Please follow instruction sheet, as given. --[place] has recommended that you have a sleep study. This test records several body functions during sleep, including: brain activity, eye movement, oxygen and carbon dioxide blood levels, heart rate and rhythm, breathing rate and rhythm, the flow of air through your mouth and nose, snoring, body muscle movements, and chest and belly movement.  Follow-Up: At [facility], you and your health needs are our priority.  As part of our continuing mission to provide you with exceptional heart care, we have created designated Provider Care Teams.  These Care Teams include your primary Cardiologist (physician) and Advanced Practice Providers (APPs -  Physician Assistants and Nurse Practitioners) who all work together to provide you with the care you need, when you need it.  We recommend signing up for the patient portal called "MyChart".  Sign up information is provided on this After Visit Summary.  MyChart is used to connect with patients for Virtual Visits (Telemedicine).  Patients are able to view lab/test results, encounter notes, upcoming appointments, etc.  Non-urgent messages can be sent to your provider as well.   To learn more about what you can do with MyChart, go to [email or web address].    Your next appointment:   3 month(s)  The format for your next appointment:   In Person  Provider:   Oswaldo Milian, MD

## 2020-04-15 ENCOUNTER — Telehealth: Payer: Self-pay

## 2020-04-15 NOTE — Telephone Encounter (Signed)
-----   Message from Roland Earl sent at 04/13/2020 11:24 AM EDT ----- Regarding: Sleep Study Ordered by Dr. Gardiner Rhyme

## 2020-04-15 NOTE — Telephone Encounter (Signed)
Pt has Medicaid as Chartered certified accountant NO PA required OK to schedule  Sent to Gershon Cull, CMA to schedule

## 2020-04-16 ENCOUNTER — Telehealth: Payer: Self-pay | Admitting: *Deleted

## 2020-04-16 NOTE — Telephone Encounter (Signed)
-----   Message from Bobby Rumpf, [place] sent at 04/15/2020 12:53 PM EDT ----- Regarding: FW: Sleep Study Pt has Medicaid as Primary insurance NO PA required OK to schedule  ----- Message ----- From: Roland Earl Sent: 04/13/2020  11:24 AM EDT To: [facility] Sleep Studies Subject: Sleep Study                                    Ordered by Dr. Gardiner Rhyme

## 2020-04-16 NOTE — Telephone Encounter (Signed)
Patient is scheduled for lab study on 05/18/20. Patient understands his sleep study will be done at [facility] sleep lab. Patient understands he will receive a sleep packet in a week or so. Patient understands to call if he does not receive the sleep packet in a timely manner.  Left detailed message on voicemail with date and time of titration and informed patient to call back to confirm or reschedule.

## 2020-04-25 ENCOUNTER — Other Ambulatory Visit: Payer: Self-pay | Admitting: Family Medicine

## 2020-04-29 ENCOUNTER — Telehealth: Payer: Self-pay | Admitting: Family Medicine

## 2020-04-29 DIAGNOSIS — E119 Type 2 diabetes mellitus without complications: Secondary | ICD-10-CM

## 2020-04-29 NOTE — Telephone Encounter (Signed)
Patient states he needs an order for another sugar machine the one he had has been discontinued. Thanks

## 2020-04-30 ENCOUNTER — Telehealth (HOSPITAL_COMMUNITY): Payer: Self-pay | Admitting: *Deleted

## 2020-04-30 NOTE — Telephone Encounter (Signed)
Patient given detailed instructions per Myocardial Perfusion Study Information Sheet for the test on 05/03/20 at 1:00. Patient notified to arrive 15 minutes early and that it is imperative to arrive on time for appointment to keep from having the test rescheduled.  If you need to cancel or reschedule your appointment, please call the office within 24 hours of your appointment. . Patient verbalized understanding.Craig [name]

## 2020-05-03 ENCOUNTER — Ambulatory Visit (HOSPITAL_COMMUNITY): Payer: Medicaid Other

## 2020-05-04 ENCOUNTER — Ambulatory Visit (HOSPITAL_COMMUNITY): Payer: Medicaid Other

## 2020-05-06 MED ORDER — ACCU-CHEK GUIDE ME W/DEVICE KIT: 1.0000 | PACK | Freq: Every day | 1 refills | Status: DC

## 2020-05-06 NOTE — Telephone Encounter (Signed)
Order for new device placed.  Craig Castilla Autry-Lott, DO 05/06/2020, 7:55 AM PGY-2, [place]

## 2020-05-11 ENCOUNTER — Telehealth: Payer: Self-pay

## 2020-05-11 ENCOUNTER — Telehealth: Payer: Self-pay | Admitting: *Deleted

## 2020-05-11 NOTE — Telephone Encounter (Signed)
Rx request for accu check guide test strips and lancets. Please advise. Joal Eakle Kennon Holter, CMA

## 2020-05-14 MED ORDER — ACCU-CHEK GUIDE VI STRP: ORAL_STRIP | 12 refills | Status: DC

## 2020-05-14 MED ORDER — ACCU-CHEK MULTICLIX LANCETS MISC: 12 refills | Status: DC

## 2020-05-14 NOTE — Telephone Encounter (Signed)
Order placed during this encounter.

## 2020-05-17 NOTE — Telephone Encounter (Signed)
Medications refilled by PCP.  Craig [name], [name]

## 2020-05-18 ENCOUNTER — Encounter (HOSPITAL_BASED_OUTPATIENT_CLINIC_OR_DEPARTMENT_OTHER): Payer: Medicaid Other | Admitting: Cardiology

## 2020-05-24 ENCOUNTER — Ambulatory Visit: Payer: Medicaid Other | Admitting: Pharmacist

## 2020-05-25 ENCOUNTER — Ambulatory Visit: Payer: Medicaid Other | Admitting: Family Medicine

## 2020-05-26 ENCOUNTER — Telehealth (HOSPITAL_COMMUNITY): Payer: Self-pay | Admitting: *Deleted

## 2020-05-26 NOTE — Telephone Encounter (Signed)
Left message on voicemail per DPR in reference to upcoming appointment scheduled on 05/31/20 at 1:00 with detailed instructions given per Myocardial Perfusion Study Information Sheet for the test. LM to arrive 15 minutes early, and that it is imperative to arrive on time for appointment to keep from having the test rescheduled. If you need to cancel or reschedule your appointment, please call the office within 24 hours of your appointment. Failure to do so may result in a cancellation of your appointment, and a $50 no show fee. Phone number given for call back for any questions.

## 2020-05-29 ENCOUNTER — Other Ambulatory Visit: Payer: Self-pay | Admitting: Family Medicine

## 2020-05-31 ENCOUNTER — Other Ambulatory Visit: Payer: Self-pay | Admitting: Family Medicine

## 2020-05-31 ENCOUNTER — Other Ambulatory Visit: Payer: Self-pay | Admitting: *Deleted

## 2020-05-31 ENCOUNTER — Ambulatory Visit (HOSPITAL_COMMUNITY): Payer: Medicaid Other

## 2020-05-31 DIAGNOSIS — I10 Essential (primary) hypertension: Secondary | ICD-10-CM

## 2020-06-01 ENCOUNTER — Ambulatory Visit (HOSPITAL_COMMUNITY): Payer: Medicaid Other

## 2020-06-02 MED ORDER — ASPIRIN 81 MG PO TBEC: DELAYED_RELEASE_TABLET | ORAL | 0 refills | Status: DC

## 2020-06-06 ENCOUNTER — Encounter (HOSPITAL_BASED_OUTPATIENT_CLINIC_OR_DEPARTMENT_OTHER): Payer: Medicaid Other | Admitting: Cardiology

## 2020-06-07 ENCOUNTER — Ambulatory Visit: Payer: Medicaid Other | Admitting: Pharmacist

## 2020-06-09 ENCOUNTER — Telehealth (HOSPITAL_COMMUNITY): Payer: Self-pay | Admitting: *Deleted

## 2020-06-09 NOTE — Telephone Encounter (Signed)
Left message on voicemail per DPR in reference to upcoming appointment scheduled on 06/16/2020 at 1045 with detailed instructions given per Myocardial Perfusion Study Information Sheet for the test. LM to arrive 15 minutes early, and that it is imperative to arrive on time for appointment to keep from having the test rescheduled. If you need to cancel or reschedule your appointment, please call the office within 24 hours of your appointment. Failure to do so may result in a cancellation of your appointment, and a $50 no show fee. Phone number given for call back for any questions. No mychart available.Jaskiran Pata, [place]

## 2020-06-10 ENCOUNTER — Ambulatory Visit: Payer: Medicaid Other | Admitting: Pharmacist

## 2020-06-14 ENCOUNTER — Other Ambulatory Visit: Payer: Self-pay | Admitting: Family Medicine

## 2020-06-15 ENCOUNTER — Other Ambulatory Visit: Payer: Self-pay

## 2020-06-15 ENCOUNTER — Ambulatory Visit (INDEPENDENT_AMBULATORY_CARE_PROVIDER_SITE_OTHER): Payer: Medicaid Other | Admitting: Family Medicine

## 2020-06-15 VITALS — BP 135/90 | HR 83 | Ht 69.5 in | Wt >= 6400 oz

## 2020-06-15 DIAGNOSIS — F32A Depression, unspecified: Secondary | ICD-10-CM | POA: Diagnosis not present

## 2020-06-15 DIAGNOSIS — F419 Anxiety disorder, unspecified: Secondary | ICD-10-CM

## 2020-06-15 DIAGNOSIS — E119 Type 2 diabetes mellitus without complications: Secondary | ICD-10-CM | POA: Diagnosis not present

## 2020-06-15 DIAGNOSIS — Z23 Encounter for immunization: Secondary | ICD-10-CM | POA: Diagnosis not present

## 2020-06-15 MED ORDER — ACCU-CHEK GUIDE VI STRP: ORAL_STRIP | 12 refills | Status: DC

## 2020-06-15 NOTE — Progress Notes (Signed)
    SUBJECTIVE:   CHIEF COMPLAINT / HPI:   Mr. Monestime is a 59 yo M who presents for the issues below.   Need for vaccination Would like to receive flu shot today.  Diabetes supplies Needs glucose test strips. Current strips prescribed does not fit his glucose monitor. Last eye exam 3 years ago. Was told he needed a referral to eye doctor. Has called around to several office who he thought would take his insurance but was told they do not.   Depression and anxiety Hx of recent SI. Currently in therapy and has a session today. Denies SI/HI today. Endorses recent weight gain may be the cause. Anxious about nuclear stress test tomorrow.   PERTINENT  PMH / PSH: Hx of Anemia and gastric cancer (patient needs colonoscopy- declined referral today would like to complete at next follow up)  OBJECTIVE:   BP 135/90   Pulse 83   Ht 5' 9.5" (1.765 m)   Wt (!) 420 lb 9.6 oz (190.8 kg)   SpO2 98%   BMI 61.22 kg/m   General: Appears well, no acute distress. Age appropriate. Cardiac: RRR, normal heart sounds, no murmurs Respiratory: CTAB, normal effort Abdomen: soft, nontender, nondistended, NABS  ASSESSMENT/PLAN:   Controlled type 2 diabetes mellitus without complication, without long-term current use of insulin (HCC) -Continue current medications -ACCU-CHEK test strips sent to pharmacy -Ophthalamology referral  -F/u in January for repeat A1c  Anxiety and depression Increase depression due to recent weight gain and anxiety surrounding stress test tomorrow. Patient voiced appreciate for conversation and encouragement. Has therapy session later today. Denies SI/HI. Needs repeat PHQ9 with follow up. Consider the need to add medication with therapy.  -Continue therapy -At follow up, obtain PHQ9; consider the need for medication therapy  Need for immunization against influenza - Flu Vaccine QUAD 36+ mos IM  Gerlene Fee, [place]

## 2020-06-15 NOTE — Patient Instructions (Addendum)
It was wonderful to see you today.  Please bring ALL of your medications with you to every visit.   Today we refilled medications.   We discussed depression surrounding increased weight gain. Please follow up with psychiatrist today.   We discussed physical options such as walking and going to the gym regularly.   Please be sure to schedule follow up in january at the front desk before you leave today.   Please call the clinic at [phone] if you have any concerns. It was our pleasure to serve you.  Dr. Janus Molder

## 2020-06-16 ENCOUNTER — Ambulatory Visit (HOSPITAL_COMMUNITY): Payer: Medicaid Other

## 2020-06-16 ENCOUNTER — Telehealth (HOSPITAL_COMMUNITY): Payer: Self-pay | Admitting: Cardiology

## 2020-06-16 NOTE — Telephone Encounter (Signed)
Patient cancelled 2 day Myoview with the Nuclear Tech due to he does not feel well and will call at a later date to reschedule. Order will be removed from the [facility] and when patient calls back we will reinstate the order.

## 2020-06-17 ENCOUNTER — Ambulatory Visit (HOSPITAL_COMMUNITY): Payer: Medicaid Other

## 2020-06-17 NOTE — Assessment & Plan Note (Addendum)
Increase depression due to recent weight gain and anxiety surrounding stress test tomorrow. Patient voiced appreciate for conversation and encouragement. Has therapy session later today. Denies SI/HI. Needs repeat PHQ9 with follow up. Consider the need to add medication with therapy.  -Continue therapy -At follow up, obtain PHQ9; consider the need for medication therapy

## 2020-06-17 NOTE — Assessment & Plan Note (Signed)
-  Continue current medications -ACCU-CHEK test strips sent to pharmacy -Ophthalamology referral  -F/u in January for repeat A1c

## 2020-06-21 ENCOUNTER — Ambulatory Visit: Payer: Medicaid Other | Admitting: Pharmacist

## 2020-06-22 ENCOUNTER — Telehealth (HOSPITAL_COMMUNITY): Payer: Self-pay | Admitting: *Deleted

## 2020-06-22 NOTE — Telephone Encounter (Signed)
Left message on voicemail per DPR in reference to upcoming appointment scheduled on 06/28/20 with detailed instructions given per Myocardial Perfusion Study Information Sheet for the test. LM to arrive 15 minutes early, and that it is imperative to arrive on time for appointment to keep from having the test rescheduled. If you need to cancel or reschedule your appointment, please call the office within 24 hours of your appointment. Failure to do so may result in a cancellation of your appointment, and a $50 no show fee. Phone number given for call back for any questions. Kirstie Peri

## 2020-06-28 ENCOUNTER — Ambulatory Visit (HOSPITAL_COMMUNITY): Payer: Medicaid Other

## 2020-06-28 ENCOUNTER — Other Ambulatory Visit (HOSPITAL_COMMUNITY): Payer: Self-pay | Admitting: Cardiology

## 2020-06-28 DIAGNOSIS — I493 Ventricular premature depolarization: Secondary | ICD-10-CM

## 2020-06-28 DIAGNOSIS — R0602 Shortness of breath: Secondary | ICD-10-CM

## 2020-06-28 DIAGNOSIS — R0600 Dyspnea, unspecified: Secondary | ICD-10-CM

## 2020-06-29 ENCOUNTER — Ambulatory Visit (HOSPITAL_COMMUNITY): Payer: Medicaid Other

## 2020-07-13 ENCOUNTER — Ambulatory Visit: Payer: Medicaid Other | Admitting: Cardiology

## 2020-07-14 ENCOUNTER — Other Ambulatory Visit: Payer: Self-pay | Admitting: Family Medicine

## 2020-07-17 ENCOUNTER — Other Ambulatory Visit: Payer: Self-pay | Admitting: Family Medicine

## 2020-07-17 DIAGNOSIS — E119 Type 2 diabetes mellitus without complications: Secondary | ICD-10-CM

## 2020-07-19 ENCOUNTER — Other Ambulatory Visit: Payer: Self-pay | Admitting: Family Medicine

## 2020-07-19 DIAGNOSIS — E119 Type 2 diabetes mellitus without complications: Secondary | ICD-10-CM

## 2020-07-21 ENCOUNTER — Telehealth (HOSPITAL_COMMUNITY): Payer: Self-pay | Admitting: *Deleted

## 2020-07-21 NOTE — Telephone Encounter (Signed)
Patient given detailed instructions per Myocardial Perfusion Study Information Sheet for the test on 07/26/20 at 1:00. Patient notified to arrive 15 minutes early and that it is imperative to arrive on time for appointment to keep from having the test rescheduled.  If you need to cancel or reschedule your appointment, please call the office within 24 hours of your appointment. . Patient verbalized understanding.Craig [name]

## 2020-07-23 ENCOUNTER — Encounter: Payer: Self-pay | Admitting: *Deleted

## 2020-07-23 NOTE — Telephone Encounter (Signed)
This encounter was created in error - please disregard.

## 2020-07-23 NOTE — Addendum Note (Signed)
Addended by: Patria Mane A on: 07/23/2020 08:58 AM   Modules accepted: Orders

## 2020-07-23 NOTE — Addendum Note (Signed)
Addended by: Oswaldo Milian on: 07/23/2020 05:50 PM   Modules accepted: Orders

## 2020-07-26 ENCOUNTER — Telehealth (HOSPITAL_COMMUNITY): Payer: Self-pay | Admitting: Cardiology

## 2020-07-26 ENCOUNTER — Ambulatory Visit (HOSPITAL_COMMUNITY): Payer: Medicaid Other

## 2020-07-26 NOTE — Telephone Encounter (Signed)
Patient called and cancelled Myoview:   12/13 and 07/27/20 Pt cancelled/[facility] 11/15 and 06/29/20 pt cancelled 11/03 and 06/17/20 pt cancelled 10/18 and 06/01/20 pt cancelled 09/20-09/21/21 pt cancelled   Order will be removed from the [facility].

## 2020-07-27 ENCOUNTER — Ambulatory Visit (HOSPITAL_COMMUNITY): Payer: Medicaid Other

## 2020-08-08 NOTE — Progress Notes (Deleted)
Cardiology Office Note:    Date:  08/08/2020   ID:  Craig [name], DOB 09/19/1960, MRN [id number]  PCP:  Gerlene Fee, DO  Cardiologist:  Donato Heinz, MD  Electrophysiologist:  None   Referring MD: Gerlene Fee, DO   No chief complaint on file.   History of Present Illness:    Craig [name] is a 59 y.o. male with a hx of gastric cancer, T2DM, GERD, morbid obesity, hyperlipidemia, hypertension who presents for follow-up.  He was referred by Dr. Ardelia Mems for evaluation of orthostatic hypotension, initially seen on 01/07/2020.  Victoza, gabapentin, valsartan, and carvedilol were discontinued.  He reports he has been having dizziness x3 to 4 weeks.  Occurring multiple times a day.  States that sometimes feels like room is spinning and sometimes just feels lightheaded.  Denies any syncopal episode.  Does report occasional palpitations where he feels like his heart is racing.  He cannot identify cause of dizziness.  Denies relationship to position change.  Does report some improvement since stopping medications.  States that prior to the pandemic, he was bowling 4 times per week and swimming 3 times per week.  Has gained 80 pounds since the pandemic started.  Quit smoking years ago.  Father had MI at age 86.  TTE on 01/05/2020 showed LVEF 55 to 60%, normal RV function, no significant valvular disease.  Zio patch x4 days on 02/20/2020 showed frequent PVCs (12% of beats).  Started on Toprol-XL 25 mg daily.  Since last clinic visit,   he reports that he has been doing OK.  Has started bowling.  No longer doing water aerobics or swimming.  Continues to have dyspnea with exertion.  Denies any chest pain. Lightheadedness has improved.  Does reports he snores.   Wt Readings from Last 3 Encounters:  06/15/20 (!) 420 lb 9.6 oz (190.8 kg)  04/13/20 (!) 405 lb (183.7 kg)  03/03/20 (!) 400 lb 6.4 oz (181.6 kg)     Past Medical History:  Diagnosis Date  . Anemia 12/15/2014  .  Anxiety and depression 08/17/2017  . Bilateral carpal tunnel syndrome 08/03/2016  . Cancer ([place]) abdomen  . Controlled type 2 diabetes mellitus without complication, without long-term current use of insulin ([place]) 09/17/2012   Well-controlled.  No history of insulin use.  . Dizziness 03/27/2012  . GERD (gastroesophageal reflux disease) 03/27/2012  . History of gastric cancer 02/14/2012   Followed by Dr. Wynetta Emery in [place].   . Hyperlipidemia associated with type 2 diabetes mellitus ([place]) 01/01/2013  . Morbid obesity with BMI of 50.0-59.9, adult ([place]) 06/05/2012  . Onychomycosis 08/27/2018  . Orthostatic hypotension 11/10/2019  . Perioral numbness 07/30/2018  . Poor dentition 12/02/2018  . Primary hypertension 04/30/2012  . Right hand paresthesia 03/26/2017    Past Surgical History:  Procedure Laterality Date  . ABDOMINAL SURGERY      Current Medications: No outpatient medications have been marked as taking for the 08/11/20 encounter (Appointment) with Donato Heinz, MD.     Allergies:   Patient has no known allergies.   Social History   Socioeconomic History  . Marital status: Single    Spouse name: Not on file  . Number of children: Not on file  . Years of education: Not on file  . Highest education level: Not on file  Occupational History  . Not on file  Tobacco Use  . Smoking status: Former Smoker    Start date: 08/14/1977    Quit date: 08/14/1996  Years since quitting: 24.0  . Smokeless tobacco: Never Used  Substance and Sexual Activity  . Alcohol use: No  . Drug use: Yes    Types: Cocaine, Marijuana, Heroin    Comment: Previous Hx of substance abuse  . Sexual activity: Yes  Other Topics Concern  . Not on file  Social History Narrative  . Not on file   Social Determinants of Health   Financial Resource Strain: Not on file  Food Insecurity: Not on file  Transportation Needs: Not on file  Physical Activity: Not on file  Stress: Not on file  Social  Connections: Not on file     Family History: [name] family history includes Diabetes type II in his mother.  ROS:   Please see the history of present illness.     All other systems reviewed and are negative.  EKGs/Labs/Other Studies Reviewed:    The following studies were reviewed today:   EKG:  EKG is ordered today.  The ekg ordered today demonstrates normal sinus rhythm, frequent PVCs, bigeminy, <73mm ST depressions in inferior leads   TTE 01/05/20: 1. Left ventricular ejection fraction, by estimation, is 55 to 60%. The  left ventricle has normal function. The left ventricle has no regional  wall motion abnormalities. Left ventricular diastolic parameters were  normal.  2. Right ventricular systolic function is normal. The right ventricular  size is normal. Tricuspid regurgitation signal is inadequate for assessing  PA pressure.  3. The mitral valve is grossly normal. No evidence of mitral valve  regurgitation. No evidence of mitral stenosis.  4. The aortic valve is tricuspid. Aortic valve regurgitation is not  visualized. Mild to moderate aortic valve sclerosis/calcification is  present, without any evidence of aortic stenosis.  5. The inferior vena cava is normal in size with greater than 50%  respiratory variability, suggesting right atrial pressure of 3 mmHg.   Recent Labs: 11/20/2019: ALT 38; TSH 0.677 01/08/2020: BUN 9; Creatinine, Ser 1.04; Potassium 4.3; Sodium 140 02/19/2020: Hemoglobin 11.3; Platelets 248  Recent Lipid Panel    Component Value Date/Time   CHOL 144 04/11/2019 1506   TRIG 78 04/11/2019 1506   HDL 43 04/11/2019 1506   CHOLHDL 3.3 04/11/2019 1506   CHOLHDL 4.3 06/16/2015 0921   VLDL 13 06/16/2015 0921   LDLCALC 85 04/11/2019 1506   LDLDIRECT 90 07/13/2017 1606   LDLDIRECT 88 02/04/2016 1115    Physical Exam:    VS:  There were no vitals taken for this visit.    Wt Readings from Last 3 Encounters:  06/15/20 (!) 420 lb 9.6 oz (190.8  kg)  04/13/20 (!) 405 lb (183.7 kg)  03/03/20 (!) 400 lb 6.4 oz (181.6 kg)     GEN:  n no acute distress HEENT: Normal NECK: No JVD; No carotid bruits LYMPHATICS: No lymphadenopathy CARDIAC:RRR, no murmurs, distant heart sounds RESPIRATORY:  Clear to auscultation without rales, wheezing or rhonchi  ABDOMEN: Soft, non-tender, non-distended MUSCULOSKELETAL:  No edema; No deformity  SKIN: Warm and dry NEUROLOGIC:  Alert and oriented x 3 PSYCHIATRIC:  Normal affect   ASSESSMENT:    No diagnosis found. PLAN:    Frequent PVCs: 12% of beats on Zio patch on 02/20/2020.  Started on Toprol-XL 50 mg daily  Dyspnea: Suspect deconditioning contributing, but could represent anginal equivalent. Had planned for [facility], but patient canceled study and does not wish to have done  Lightheadedness: Unclear cause.  Orthostatics in clinic were negative for drop in blood pressure, but  did have significant increase in heart rate.  Could represent POTS.  TTE shows no structural heart disease.  Given palpitations and PVCs on EKG, Zio patch x4 days was checked and showed frequent PVCs (12% of beats).  Started on Toprol-XL as above.  Hypertension: On irbesartan 150 mg daily and Toprol-XL 25 mg daily  Hyperlipidemia: On atorvastatin 80 mg daily.  LDL 85 on 04/11/2019  T2DM: On Metformin 500 mg daily.  A1c 5.6 on 10/15/2019  Morbid obesity:There is no height or weight on file to calculate BMI.  Diet and exercise encouraged  Snoring: Will check sleep study  RTC in ***    Medication Adjustments/Labs and Tests Ordered: Current medicines are reviewed at length with the patient today.  Concerns regarding medicines are outlined above.  No orders of the defined types were placed in this encounter.  No orders of the defined types were placed in this encounter.   There are no Patient Instructions on file for this visit.   Signed, Donato Heinz, MD  08/08/2020 2:09 PM    [facility]

## 2020-08-09 ENCOUNTER — Telehealth: Payer: Self-pay | Admitting: Cardiology

## 2020-08-09 NOTE — Telephone Encounter (Signed)
Spoke with patient who states that he had to cancel his stress test due to knee pain related to gout and arthritis. Patient states that he wanted to make sure Dr. Gardiner Rhyme was aware prior to his next OV which is 1/12.   Patient also states that he will need help with a wheelchair at his OV. Patient states that he knows there are wheel chairs in the lobby but will need help with being pushed up to the office.   Advised patient I would forward message to Dr. Gardiner Rhyme and His Nurse. Patient verbalized understanding.

## 2020-08-09 NOTE — Telephone Encounter (Signed)
Yvette called to reschedule his appt for a later time and ended up rescheduling for 08/25/20. He requested I send a message informing Dr. Gardiner Rhyme he has not had his sleep study or stress test performed due to having knee issues that causes him to have trouble walking. Please advise.

## 2020-08-11 ENCOUNTER — Ambulatory Visit: Payer: Medicaid Other | Admitting: Cardiology

## 2020-08-18 LAB — HM DIABETES EYE EXAM

## 2020-08-22 NOTE — Progress Notes (Deleted)
Cardiology Office Note:    Date:  08/22/2020   ID:  NUR KRASINSKI, DOB [birth year], MRN [id number]  PCP:  Gerlene Fee, DO  Cardiologist:  Donato Heinz, MD  Electrophysiologist:  None   Referring MD: Gerlene Fee, DO   No chief complaint on file.   History of Present Illness:    Craig [name] is a 60 y.o. male with a hx of gastric cancer, T2DM, GERD, morbid obesity, hyperlipidemia, hypertension who presents for follow-up.  He was referred by Dr. Ardelia Mems for evaluation of orthostatic hypotension, initially seen on 01/07/2020.  Victoza, gabapentin, valsartan, and carvedilol were discontinued.  He reports he has been having dizziness x3 to 4 weeks.  Occurring multiple times a day.  States that sometimes feels like room is spinning and sometimes just feels lightheaded.  Denies any syncopal episode.  Does report occasional palpitations where he feels like his heart is racing.  He cannot identify cause of dizziness.  Denies relationship to position change.  Does report some improvement since stopping medications.  States that prior to the pandemic, he was bowling 4 times per week and swimming 3 times per week.  Has gained 80 pounds since the pandemic started.  Quit smoking years ago.  Father had MI at age 79.  TTE on 01/05/2020 showed LVEF 55 to 60%, normal RV function, no significant valvular disease.  Zio patch x4 days on 02/20/2020 showed frequent PVCs (12% of beats).  Started on Toprol-XL 25 mg daily.  Since last clinic visit,   he reports that he has been doing OK.  Has started bowling.  No longer doing water aerobics or swimming.  Continues to have dyspnea with exertion.  Denies any chest pain. Lightheadedness has improved.  Does reports he snores.   Wt Readings from Last 3 Encounters:  06/15/20 (!) 420 lb 9.6 oz (190.8 kg)  04/13/20 (!) 405 lb (183.7 kg)  03/03/20 (!) 400 lb 6.4 oz (181.6 kg)     Past Medical History:  Diagnosis Date  . Anemia 12/15/2014  . Anxiety  and depression 08/17/2017  . Bilateral carpal tunnel syndrome 08/03/2016  . Cancer ([place]) abdomen  . Controlled type 2 diabetes mellitus without complication, without long-term current use of insulin ([place]) 09/17/2012   Well-controlled.  No history of insulin use.  . Dizziness 03/27/2012  . GERD (gastroesophageal reflux disease) 03/27/2012  . History of gastric cancer 02/14/2012   Followed by Dr. Wynetta Emery in [place].   . Hyperlipidemia associated with type 2 diabetes mellitus ([place]) 01/01/2013  . Morbid obesity with BMI of 50.0-59.9, adult ([place]) 06/05/2012  . Onychomycosis 08/27/2018  . Orthostatic hypotension 11/10/2019  . Perioral numbness 07/30/2018  . Poor dentition 12/02/2018  . Primary hypertension 04/30/2012  . Right hand paresthesia 03/26/2017    Past Surgical History:  Procedure Laterality Date  . ABDOMINAL SURGERY      Current Medications: No outpatient medications have been marked as taking for the 08/25/20 encounter (Appointment) with Donato Heinz, MD.     Allergies:   Patient has no known allergies.   Social History   Socioeconomic History  . Marital status: Single    Spouse name: Not on file  . Number of children: Not on file  . Years of education: Not on file  . Highest education level: Not on file  Occupational History  . Not on file  Tobacco Use  . Smoking status: Former Smoker    Start date: 08/14/1977    Quit date: 08/14/1996  Years since quitting: 24.0  . Smokeless tobacco: Never Used  Substance and Sexual Activity  . Alcohol use: No  . Drug use: Yes    Types: Cocaine, Marijuana, Heroin    Comment: Previous Hx of substance abuse  . Sexual activity: Yes  Other Topics Concern  . Not on file  Social History Narrative  . Not on file   Social Determinants of Health   Financial Resource Strain: Not on file  Food Insecurity: Not on file  Transportation Needs: Not on file  Physical Activity: Not on file  Stress: Not on file  Social Connections: Not  on file     Family History: [name] family history includes Diabetes type II in his mother.  ROS:   Please see the history of present illness.     All other systems reviewed and are negative.  EKGs/Labs/Other Studies Reviewed:    The following studies were reviewed today:   EKG:  EKG is ordered today.  The ekg ordered today demonstrates normal sinus rhythm, frequent PVCs, bigeminy, <11mm ST depressions in inferior leads   TTE 01/05/20: 1. Left ventricular ejection fraction, by estimation, is 55 to 60%. The  left ventricle has normal function. The left ventricle has no regional  wall motion abnormalities. Left ventricular diastolic parameters were  normal.  2. Right ventricular systolic function is normal. The right ventricular  size is normal. Tricuspid regurgitation signal is inadequate for assessing  PA pressure.  3. The mitral valve is grossly normal. No evidence of mitral valve  regurgitation. No evidence of mitral stenosis.  4. The aortic valve is tricuspid. Aortic valve regurgitation is not  visualized. Mild to moderate aortic valve sclerosis/calcification is  present, without any evidence of aortic stenosis.  5. The inferior vena cava is normal in size with greater than 50%  respiratory variability, suggesting right atrial pressure of 3 mmHg.   Recent Labs: 11/20/2019: ALT 38; TSH 0.677 01/08/2020: BUN 9; Creatinine, Ser 1.04; Potassium 4.3; Sodium 140 02/19/2020: Hemoglobin 11.3; Platelets 248  Recent Lipid Panel    Component Value Date/Time   CHOL 144 04/11/2019 1506   TRIG 78 04/11/2019 1506   HDL 43 04/11/2019 1506   CHOLHDL 3.3 04/11/2019 1506   CHOLHDL 4.3 06/16/2015 0921   VLDL 13 06/16/2015 0921   LDLCALC 85 04/11/2019 1506   LDLDIRECT 90 07/13/2017 1606   LDLDIRECT 88 02/04/2016 1115    Physical Exam:    VS:  There were no vitals taken for this visit.    Wt Readings from Last 3 Encounters:  06/15/20 (!) 420 lb 9.6 oz (190.8 kg)  04/13/20  (!) 405 lb (183.7 kg)  03/03/20 (!) 400 lb 6.4 oz (181.6 kg)     GEN:  n no acute distress HEENT: Normal NECK: No JVD; No carotid bruits LYMPHATICS: No lymphadenopathy CARDIAC:RRR, no murmurs, distant heart sounds RESPIRATORY:  Clear to auscultation without rales, wheezing or rhonchi  ABDOMEN: Soft, non-tender, non-distended MUSCULOSKELETAL:  No edema; No deformity  SKIN: Warm and dry NEUROLOGIC:  Alert and oriented x 3 PSYCHIATRIC:  Normal affect   ASSESSMENT:    No diagnosis found. PLAN:    Frequent PVCs: 12% of beats on Zio patch on 02/20/2020.  Started on Toprol-XL 50 mg daily  Dyspnea: Suspect deconditioning contributing, but could represent anginal equivalent. Had planned for [facility], but patient canceled study and does not wish to have done  Lightheadedness: Unclear cause.  Orthostatics in clinic were negative for drop in blood pressure, but  did have significant increase in heart rate.  Could represent POTS.  TTE shows no structural heart disease.  Given palpitations and PVCs on EKG, Zio patch x4 days was checked and showed frequent PVCs (12% of beats).  Started on Toprol-XL as above.  Hypertension: On irbesartan 150 mg daily and Toprol-XL 25 mg daily  Hyperlipidemia: On atorvastatin 80 mg daily.  LDL 85 on 04/11/2019  T2DM: On Metformin 500 mg daily.  A1c 5.6 on 10/15/2019  Morbid obesity:There is no height or weight on file to calculate BMI.  Diet and exercise encouraged  Snoring: Will check sleep study  RTC in ***    Medication Adjustments/Labs and Tests Ordered: Current medicines are reviewed at length with the patient today.  Concerns regarding medicines are outlined above.  No orders of the defined types were placed in this encounter.  No orders of the defined types were placed in this encounter.   There are no Patient Instructions on file for this visit.   Signed, Donato Heinz, MD  08/22/2020 4:14 PM    [facility]

## 2020-08-24 ENCOUNTER — Other Ambulatory Visit: Payer: Self-pay | Admitting: Family Medicine

## 2020-08-24 ENCOUNTER — Telehealth: Payer: Self-pay | Admitting: Cardiology

## 2020-08-24 ENCOUNTER — Encounter (INDEPENDENT_AMBULATORY_CARE_PROVIDER_SITE_OTHER): Payer: Self-pay | Admitting: Ophthalmology

## 2020-08-24 DIAGNOSIS — E785 Hyperlipidemia, unspecified: Secondary | ICD-10-CM

## 2020-08-24 DIAGNOSIS — E1169 Type 2 diabetes mellitus with other specified complication: Secondary | ICD-10-CM

## 2020-08-24 NOTE — Telephone Encounter (Signed)
Patient calling to let Dr. Su Grand know that he is still having issues with his knee. He is unsure if it is the arthritis or his weight. He has rescheduled his appt tomorrow with him and reschedule for 09/13/20 at 11:20am

## 2020-08-24 NOTE — Telephone Encounter (Signed)
Pt wanted to make Dr. Gardiner Rhyme aware that he continues to have issue with his knee and wondering if MD recommends a different test. Pt also rescheduled appointment for 1/31.   Will forward to MD and nurse.

## 2020-08-25 ENCOUNTER — Ambulatory Visit: Payer: Medicaid Other | Admitting: Cardiology

## 2020-08-26 NOTE — Telephone Encounter (Signed)
Is he talking about the stress test?  We had planned for [facility]

## 2020-08-27 NOTE — Telephone Encounter (Signed)
Spoke to patient-aware the stress test ordered is a lexiscan and does not require him to exercise.    He states he will call Monday to reschedule stress test.    Patient concerned he will need assistance to get into office.   Advised to arrive early and call to request assistance into the office.

## 2020-09-01 ENCOUNTER — Encounter (INDEPENDENT_AMBULATORY_CARE_PROVIDER_SITE_OTHER): Payer: Self-pay | Admitting: Ophthalmology

## 2020-09-03 ENCOUNTER — Other Ambulatory Visit: Payer: Self-pay

## 2020-09-03 DIAGNOSIS — I10 Essential (primary) hypertension: Secondary | ICD-10-CM

## 2020-09-05 MED ORDER — ASPIRIN 81 MG PO TBEC: DELAYED_RELEASE_TABLET | ORAL | 0 refills | Status: DC

## 2020-09-09 NOTE — Progress Notes (Deleted)
    SUBJECTIVE:   CHIEF COMPLAINT / HPI:   Mr. Davee is a 60 yo M who presents for the concerns below.   PERTINENT  PMH / PSH: ***  OBJECTIVE:   There were no vitals taken for this visit.  ***  ASSESSMENT/PLAN:   No problem-specific Assessment & Plan notes found for this encounter.     Gerlene Fee, [place]

## 2020-09-10 ENCOUNTER — Ambulatory Visit: Payer: Medicaid Other | Admitting: Family Medicine

## 2020-09-12 NOTE — Progress Notes (Deleted)
Cardiology Office Note:    Date:  09/12/2020   ID:  Craig [name], DOB 1961-03-31, MRN [id number]  PCP:  Gerlene Fee, DO  Cardiologist:  Donato Heinz, MD  Electrophysiologist:  None   Referring MD: Gerlene Fee, DO   No chief complaint on file.   History of Present Illness:    Craig [name] is a 60 y.o. male with a hx of gastric cancer, T2DM, GERD, morbid obesity, hyperlipidemia, hypertension who presents for follow-up.  He was referred by Dr. Ardelia Mems for evaluation of orthostatic hypotension, initially seen on 01/07/2020.  Victoza, gabapentin, valsartan, and carvedilol were discontinued.  He reports he has been having dizziness x3 to 4 weeks.  Occurring multiple times a day.  States that sometimes feels like room is spinning and sometimes just feels lightheaded.  Denies any syncopal episode.  Does report occasional palpitations where he feels like his heart is racing.  He cannot identify cause of dizziness.  Denies relationship to position change.  Does report some improvement since stopping medications.  States that prior to the pandemic, he was bowling 4 times per week and swimming 3 times per week.  Has gained 80 pounds since the pandemic started.  Quit smoking years ago.  Father had MI at age 43.  TTE on 01/05/2020 showed LVEF 55 to 60%, normal RV function, no significant valvular disease.  Zio patch x4 days on 02/20/2020 showed frequent PVCs (12% of beats).  Started on Toprol-XL 25 mg daily.  Since last clinic visit,   he reports that he has been doing OK.  Has started bowling.  No longer doing water aerobics or swimming.  Continues to have dyspnea with exertion.  Denies any chest pain. Lightheadedness has improved.  Does reports he snores.   Wt Readings from Last 3 Encounters:  06/15/20 (!) 420 lb 9.6 oz (190.8 kg)  04/13/20 (!) 405 lb (183.7 kg)  03/03/20 (!) 400 lb 6.4 oz (181.6 kg)     Past Medical History:  Diagnosis Date  . Anemia 12/15/2014  . Anxiety  and depression 08/17/2017  . Bilateral carpal tunnel syndrome 08/03/2016  . Cancer ([place]) abdomen  . Controlled type 2 diabetes mellitus without complication, without long-term current use of insulin ([place]) 09/17/2012   Well-controlled.  No history of insulin use.  . Dizziness 03/27/2012  . GERD (gastroesophageal reflux disease) 03/27/2012  . History of gastric cancer 02/14/2012   Followed by Dr. Wynetta Emery in [place].   . Hyperlipidemia associated with type 2 diabetes mellitus ([place]) 01/01/2013  . Morbid obesity with BMI of 50.0-59.9, adult ([place]) 06/05/2012  . Onychomycosis 08/27/2018  . Orthostatic hypotension 11/10/2019  . Perioral numbness 07/30/2018  . Poor dentition 12/02/2018  . Primary hypertension 04/30/2012  . Right hand paresthesia 03/26/2017    Past Surgical History:  Procedure Laterality Date  . ABDOMINAL SURGERY      Current Medications: No outpatient medications have been marked as taking for the 09/13/20 encounter (Appointment) with Donato Heinz, MD.     Allergies:   Patient has no known allergies.   Social History   Socioeconomic History  . Marital status: Single    Spouse name: Not on file  . Number of children: Not on file  . Years of education: Not on file  . Highest education level: Not on file  Occupational History  . Not on file  Tobacco Use  . Smoking status: Former Smoker    Start date: 08/14/1977    Quit date: 08/14/1996  Years since quitting: 24.0  . Smokeless tobacco: Never Used  Substance and Sexual Activity  . Alcohol use: No  . Drug use: Yes    Types: Cocaine, Marijuana, Heroin    Comment: Previous Hx of substance abuse  . Sexual activity: Yes  Other Topics Concern  . Not on file  Social History Narrative  . Not on file   Social Determinants of Health   Financial Resource Strain: Not on file  Food Insecurity: Not on file  Transportation Needs: Not on file  Physical Activity: Not on file  Stress: Not on file  Social Connections: Not  on file     Family History: [name] family history includes Diabetes type II in his mother.  ROS:   Please see the history of present illness.     All other systems reviewed and are negative.  EKGs/Labs/Other Studies Reviewed:    The following studies were reviewed today:   EKG:  EKG is ordered today.  The ekg ordered today demonstrates normal sinus rhythm, frequent PVCs, bigeminy, <11mm ST depressions in inferior leads   TTE 01/05/20: 1. Left ventricular ejection fraction, by estimation, is 55 to 60%. The  left ventricle has normal function. The left ventricle has no regional  wall motion abnormalities. Left ventricular diastolic parameters were  normal.  2. Right ventricular systolic function is normal. The right ventricular  size is normal. Tricuspid regurgitation signal is inadequate for assessing  PA pressure.  3. The mitral valve is grossly normal. No evidence of mitral valve  regurgitation. No evidence of mitral stenosis.  4. The aortic valve is tricuspid. Aortic valve regurgitation is not  visualized. Mild to moderate aortic valve sclerosis/calcification is  present, without any evidence of aortic stenosis.  5. The inferior vena cava is normal in size with greater than 50%  respiratory variability, suggesting right atrial pressure of 3 mmHg.   Recent Labs: 11/20/2019: ALT 38; TSH 0.677 01/08/2020: BUN 9; Creatinine, Ser 1.04; Potassium 4.3; Sodium 140 02/19/2020: Hemoglobin 11.3; Platelets 248  Recent Lipid Panel    Component Value Date/Time   CHOL 144 04/11/2019 1506   TRIG 78 04/11/2019 1506   HDL 43 04/11/2019 1506   CHOLHDL 3.3 04/11/2019 1506   CHOLHDL 4.3 06/16/2015 0921   VLDL 13 06/16/2015 0921   LDLCALC 85 04/11/2019 1506   LDLDIRECT 90 07/13/2017 1606   LDLDIRECT 88 02/04/2016 1115    Physical Exam:    VS:  There were no vitals taken for this visit.    Wt Readings from Last 3 Encounters:  06/15/20 (!) 420 lb 9.6 oz (190.8 kg)  04/13/20  (!) 405 lb (183.7 kg)  03/03/20 (!) 400 lb 6.4 oz (181.6 kg)     GEN:  n no acute distress HEENT: Normal NECK: No JVD; No carotid bruits LYMPHATICS: No lymphadenopathy CARDIAC:RRR, no murmurs, distant heart sounds RESPIRATORY:  Clear to auscultation without rales, wheezing or rhonchi  ABDOMEN: Soft, non-tender, non-distended MUSCULOSKELETAL:  No edema; No deformity  SKIN: Warm and dry NEUROLOGIC:  Alert and oriented x 3 PSYCHIATRIC:  Normal affect   ASSESSMENT:    No diagnosis found. PLAN:    Frequent PVCs: 12% of beats on Zio patch on 02/20/2020.  Started on Toprol-XL 50 mg daily  Dyspnea: Suspect deconditioning contributing, but could represent anginal equivalent. Had planned for [facility], but patient canceled study and does not wish to have done  Lightheadedness: Unclear cause.  Orthostatics in clinic were negative for drop in blood pressure, but  did have significant increase in heart rate.  Could represent POTS.  TTE shows no structural heart disease.  Given palpitations and PVCs on EKG, Zio patch x4 days was checked and showed frequent PVCs (12% of beats).  Started on Toprol-XL as above.  Hypertension: On irbesartan 150 mg daily and Toprol-XL 25 mg daily  Hyperlipidemia: On atorvastatin 80 mg daily.  LDL 85 on 04/11/2019  T2DM: On Metformin 500 mg daily.  A1c 5.6 on 10/15/2019  Morbid obesity:There is no height or weight on file to calculate BMI.  Diet and exercise encouraged  Snoring: Will check sleep study  RTC in ***    Medication Adjustments/Labs and Tests Ordered: Current medicines are reviewed at length with the patient today.  Concerns regarding medicines are outlined above.  No orders of the defined types were placed in this encounter.  No orders of the defined types were placed in this encounter.   There are no Patient Instructions on file for this visit.   Signed, Donato Heinz, MD  09/12/2020 8:54 PM    [facility]

## 2020-09-13 ENCOUNTER — Ambulatory Visit: Payer: Medicaid Other | Admitting: Cardiology

## 2020-09-13 ENCOUNTER — Other Ambulatory Visit: Payer: Self-pay

## 2020-09-13 ENCOUNTER — Telehealth (INDEPENDENT_AMBULATORY_CARE_PROVIDER_SITE_OTHER): Payer: Medicaid Other | Admitting: Family Medicine

## 2020-09-13 ENCOUNTER — Telehealth: Payer: Self-pay | Admitting: Cardiology

## 2020-09-13 DIAGNOSIS — Z760 Encounter for issue of repeat prescription: Secondary | ICD-10-CM | POA: Diagnosis not present

## 2020-09-13 DIAGNOSIS — M25561 Pain in right knee: Secondary | ICD-10-CM | POA: Diagnosis not present

## 2020-09-13 DIAGNOSIS — M25562 Pain in left knee: Secondary | ICD-10-CM | POA: Diagnosis not present

## 2020-09-13 DIAGNOSIS — G8929 Other chronic pain: Secondary | ICD-10-CM | POA: Diagnosis not present

## 2020-09-13 DIAGNOSIS — E1140 Type 2 diabetes mellitus with diabetic neuropathy, unspecified: Secondary | ICD-10-CM | POA: Diagnosis not present

## 2020-09-13 MED ORDER — DICLOFENAC SODIUM 1 % EX GEL: 2.0000 g | Freq: Four times a day (QID) | CUTANEOUS | 0 refills | Status: DC

## 2020-09-13 MED ORDER — GABAPENTIN 100 MG PO CAPS: 100.0000 mg | ORAL_CAPSULE | Freq: Three times a day (TID) | ORAL | 3 refills | Status: DC

## 2020-09-13 NOTE — Telephone Encounter (Signed)
Spoke with pt, he is needing an MRI for his knee and has had trouble fitting into the MRI machine. Aware the only place I am aware is [facility] [place] has a large bore MRI. He will follow up with his medical doctor to research where the scan can be done.

## 2020-09-13 NOTE — Progress Notes (Addendum)
[place] Telemedicine Visit  Patient consented to have virtual visit and was identified by name and date of birth. Method of visit: Video was attempted but was interrupted: <50% of visit completed via video  Encounter participants: Patient: Craig [name] - located at home Provider: Gerlene Fee - located at home office Others (if applicable): N/a  Chief Complaint: Needs new Rx for gabapentin, knee pain  HPI:  Bilateral Knee pain, chronic Hx of arthritis. Fell 2 months prior. Fell onto his side, admits his floors are slippery. Pain feels sharp with weight bearing not with laying down. Now has life alert necklace. Denies falling on knee or bending knee. Denies knee swelling or obvious deformity. Attempted to get an MRI on his own but was unable to do so. Now walking with a cane. Unable to bowl which is his usual activity. Was trying voltaren gel but has since run out. Voltaren gel and gabapentin helping with relief.  Medication refill Started taking gabapentin for neuropathy and knee pain as above.  Does not have a current prescription. Has been taking 100mg  in the morning and 200 mg at night. Helping with pain relief.   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  Respiratory: Speaking in full sentences. Breathing did not sound labored at this time.   Assessment/Plan:  Chronic pain of both knees Hx of chronic knee pain and neuropathy. Unsure when new pain started. Hx of prior fall months earlier but denies injury to knees at this time. Now using cane as assistive walking device. Pain only when weight bearing and localized to the lateral side of each knee. No reported swelling or obvious deformity. No prior imaging for review. Using voltaren gel for relief and has since run out. Will refill today. Expressed limitation of telemedicine visit and encouraged patient to follow  up in office for further evaluation if persistent or worsening symptoms. Consider need for joint injection, PT, weight management, and/or imaging in future visit.   Medication refill - gabapentin (NEURONTIN) 100 MG capsule; Take 1 capsule (100 mg total) by mouth 3 (three) times daily.  Dispense: 90 capsule; Refill: 3   Time spent during visit with patient: 20 minutes

## 2020-09-13 NOTE — Assessment & Plan Note (Addendum)
Hx of chronic knee pain and neuropathy. Unsure when new pain started. Hx of prior fall months earlier but denies injury to knees at this time. Now using cane as assistive walking device. Pain only when weight bearing and localized to the lateral side of each knee. No reported swelling or obvious deformity. No prior imaging for review. Using voltaren gel for relief and has since run out. Will refill today. Expressed limitation of telemedicine visit and encouraged patient to follow up in office for further evaluation if persistent or worsening symptoms. Consider need for joint injection, PT, weight management, and/or imaging in future visit.

## 2020-09-13 NOTE — Assessment & Plan Note (Signed)
-   gabapentin (NEURONTIN) 100 MG capsule; Take 1 capsule (100 mg total) by mouth 3 (three) times daily.  Dispense: 90 capsule; Refill: 3

## 2020-09-13 NOTE — Telephone Encounter (Signed)
Patient wanted to know if Dr. Gardiner Rhyme had any information about a place that could do an open MRI. The patient is of larger stature and has gotten stuck in the machine in the past. He also is claustrophobic and is allergic to the medication that reduces the claustrophobia

## 2020-10-17 NOTE — Progress Notes (Deleted)
Cardiology Office Note:    Date:  10/17/2020   ID:  [name], DOB 27-Jul-1961, MRN [id number]  PCP:  Gerlene Fee, DO  Cardiologist:  Donato Heinz, MD  Electrophysiologist:  None   Referring MD: Gerlene Fee, DO   No chief complaint on file.   History of Present Illness:    Craig [name] is a 60 y.o. male with a hx of gastric cancer, T2DM, GERD, morbid obesity, hyperlipidemia, hypertension who presents for follow-up.  He was referred by Dr. Ardelia Mems for evaluation of orthostatic hypotension, initially seen on 01/07/2020.  Victoza, gabapentin, valsartan, and carvedilol were discontinued.  He reports he has been having dizziness x3 to 4 weeks.  Occurring multiple times a day.  States that sometimes feels like room is spinning and sometimes just feels lightheaded.  Denies any syncopal episode.  Does report occasional palpitations where he feels like his heart is racing.  He cannot identify cause of dizziness.  Denies relationship to position change.  Does report some improvement since stopping medications.  States that prior to the pandemic, he was bowling 4 times per week and swimming 3 times per week.  Has gained 80 pounds since the pandemic started.  Quit smoking years ago.  Father had MI at age 75.  TTE on 01/05/2020 showed LVEF 55 to 60%, normal RV function, no significant valvular disease.  Zio patch x4 days on 02/20/2020 showed frequent PVCs (12% of beats).  Started on Toprol-XL 25 mg daily.  Since last clinic visit,  he reports that he has been doing OK.  Has started bowling.  No longer doing water aerobics or swimming.  Continues to have dyspnea with exertion.  Denies any chest pain. Lightheadedness has improved.  Does reports he snores.   Wt Readings from Last 3 Encounters:  06/15/20 (!) 420 lb 9.6 oz (190.8 kg)  04/13/20 (!) 405 lb (183.7 kg)  03/03/20 (!) 400 lb 6.4 oz (181.6 kg)     Past Medical History:  Diagnosis Date  . Anemia 12/15/2014  . Anxiety  and depression 08/17/2017  . Bilateral carpal tunnel syndrome 08/03/2016  . Cancer ([place]) abdomen  . Controlled type 2 diabetes mellitus without complication, without long-term current use of insulin ([place]) 09/17/2012   Well-controlled.  No history of insulin use.  . Dizziness 03/27/2012  . GERD (gastroesophageal reflux disease) 03/27/2012  . History of gastric cancer 02/14/2012   Followed by Dr. Wynetta Emery in [place].   . Hyperlipidemia associated with type 2 diabetes mellitus ([place]) 01/01/2013  . Morbid obesity with BMI of 50.0-59.9, adult ([place]) 06/05/2012  . Onychomycosis 08/27/2018  . Orthostatic hypotension 11/10/2019  . Perioral numbness 07/30/2018  . Poor dentition 12/02/2018  . Primary hypertension 04/30/2012  . Right hand paresthesia 03/26/2017    Past Surgical History:  Procedure Laterality Date  . ABDOMINAL SURGERY      Current Medications: No outpatient medications have been marked as taking for the 10/18/20 encounter (Appointment) with Donato Heinz, MD.     Allergies:   Patient has no known allergies.   Social History   Socioeconomic History  . Marital status: Single    Spouse name: Not on file  . Number of children: Not on file  . Years of education: Not on file  . Highest education level: Not on file  Occupational History  . Not on file  Tobacco Use  . Smoking status: Former Smoker    Start date: 08/14/1977    Quit date: 08/14/1996  Years since quitting: 24.1  . Smokeless tobacco: Never Used  Substance and Sexual Activity  . Alcohol use: No  . Drug use: Yes    Types: Cocaine, Marijuana, Heroin    Comment: Previous Hx of substance abuse  . Sexual activity: Yes  Other Topics Concern  . Not on file  Social History Narrative  . Not on file   Social Determinants of Health   Financial Resource Strain: Not on file  Food Insecurity: Not on file  Transportation Needs: Not on file  Physical Activity: Not on file  Stress: Not on file  Social Connections: Not  on file     Family History: [name] family history includes Diabetes type II in his mother.  ROS:   Please see the history of present illness.     All other systems reviewed and are negative.  EKGs/Labs/Other Studies Reviewed:    The following studies were reviewed today:   EKG:  EKG is ordered today.  The ekg ordered today demonstrates normal sinus rhythm, frequent PVCs, bigeminy, <73mm ST depressions in inferior leads   TTE 01/05/20: 1. Left ventricular ejection fraction, by estimation, is 55 to 60%. The  left ventricle has normal function. The left ventricle has no regional  wall motion abnormalities. Left ventricular diastolic parameters were  normal.  2. Right ventricular systolic function is normal. The right ventricular  size is normal. Tricuspid regurgitation signal is inadequate for assessing  PA pressure.  3. The mitral valve is grossly normal. No evidence of mitral valve  regurgitation. No evidence of mitral stenosis.  4. The aortic valve is tricuspid. Aortic valve regurgitation is not  visualized. Mild to moderate aortic valve sclerosis/calcification is  present, without any evidence of aortic stenosis.  5. The inferior vena cava is normal in size with greater than 50%  respiratory variability, suggesting right atrial pressure of 3 mmHg.   Recent Labs: 11/20/2019: ALT 38; TSH 0.677 01/08/2020: BUN 9; Creatinine, Ser 1.04; Potassium 4.3; Sodium 140 02/19/2020: Hemoglobin 11.3; Platelets 248  Recent Lipid Panel    Component Value Date/Time   CHOL 144 04/11/2019 1506   TRIG 78 04/11/2019 1506   HDL 43 04/11/2019 1506   CHOLHDL 3.3 04/11/2019 1506   CHOLHDL 4.3 06/16/2015 0921   VLDL 13 06/16/2015 0921   LDLCALC 85 04/11/2019 1506   LDLDIRECT 90 07/13/2017 1606   LDLDIRECT 88 02/04/2016 1115    Physical Exam:    VS:  There were no vitals taken for this visit.    Wt Readings from Last 3 Encounters:  06/15/20 (!) 420 lb 9.6 oz (190.8 kg)  04/13/20  (!) 405 lb (183.7 kg)  03/03/20 (!) 400 lb 6.4 oz (181.6 kg)     GEN:  n no acute distress HEENT: Normal NECK: No JVD; No carotid bruits LYMPHATICS: No lymphadenopathy CARDIAC:RRR, no murmurs, distant heart sounds RESPIRATORY:  Clear to auscultation without rales, wheezing or rhonchi  ABDOMEN: Soft, non-tender, non-distended MUSCULOSKELETAL:  No edema; No deformity  SKIN: Warm and dry NEUROLOGIC:  Alert and oriented x 3 PSYCHIATRIC:  Normal affect   ASSESSMENT:    No diagnosis found. PLAN:    Frequent PVCs: 12% of beats on Zio patch on 02/20/2020.  Currently on Toprol-XL to 50 mg daily, will recheck Zio patch  Dyspnea: Suspect deconditioning contributing, but could represent anginal equivalent.  Will plan for [facility].  Lightheadedness: Unclear cause.  Orthostatics in clinic were negative for drop in blood pressure, but did have significant increase in  heart rate.  Could represent POTS.  TTE shows no structural heart disease.  Given palpitations and PVCs on EKG, Zio patch x4 days was checked and showed frequent PVCs (12% of beats).  Started on Toprol-XL as above.  Hypertension: On irbesartan 150 mg daily and Toprol-XL 50 mg daily  Hyperlipidemia: On atorvastatin 80 mg daily.  LDL 85 on 04/11/2019  T2DM: On Metformin 500 mg daily.  A1c 5.6 on 10/15/2019  Morbid obesity:There is no height or weight on file to calculate BMI.  Diet and exercise encouraged  Snoring: Will check sleep study  RTC in 3 months  Shared Decision Making/Informed Consent The risks [chest pain, shortness of breath, cardiac arrhythmias, dizziness, blood pressure fluctuations, myocardial infarction, stroke/transient ischemic attack, nausea, vomiting, allergic reaction, radiation exposure, metallic taste sensation and life-threatening complications (estimated to be 1 in 10,000)], benefits (risk stratification, diagnosing coronary artery disease, treatment guidance) and alternatives of a nuclear stress test  were discussed in detail with Mr. Obey and he agrees to proceed.       Medication Adjustments/Labs and Tests Ordered: Current medicines are reviewed at length with the patient today.  Concerns regarding medicines are outlined above.  No orders of the defined types were placed in this encounter.  No orders of the defined types were placed in this encounter.   There are no Patient Instructions on file for this visit.   Signed, Donato Heinz, MD  10/17/2020 6:15 PM    [facility]

## 2020-10-18 ENCOUNTER — Ambulatory Visit: Payer: Medicaid Other | Admitting: Cardiology

## 2020-10-22 ENCOUNTER — Ambulatory Visit (INDEPENDENT_AMBULATORY_CARE_PROVIDER_SITE_OTHER): Payer: Medicaid Other

## 2020-10-22 ENCOUNTER — Encounter: Payer: Self-pay | Admitting: Radiology

## 2020-10-22 ENCOUNTER — Other Ambulatory Visit: Payer: Self-pay

## 2020-10-22 ENCOUNTER — Ambulatory Visit (INDEPENDENT_AMBULATORY_CARE_PROVIDER_SITE_OTHER): Payer: Medicaid Other | Admitting: Cardiology

## 2020-10-22 ENCOUNTER — Encounter: Payer: Self-pay | Admitting: Cardiology

## 2020-10-22 VITALS — BP 134/82 | HR 92 | Ht 70.0 in | Wt >= 6400 oz

## 2020-10-22 DIAGNOSIS — R0683 Snoring: Secondary | ICD-10-CM | POA: Diagnosis not present

## 2020-10-22 DIAGNOSIS — I493 Ventricular premature depolarization: Secondary | ICD-10-CM | POA: Diagnosis not present

## 2020-10-22 DIAGNOSIS — E785 Hyperlipidemia, unspecified: Secondary | ICD-10-CM

## 2020-10-22 DIAGNOSIS — I10 Essential (primary) hypertension: Secondary | ICD-10-CM | POA: Diagnosis not present

## 2020-10-22 DIAGNOSIS — R0600 Dyspnea, unspecified: Secondary | ICD-10-CM

## 2020-10-22 DIAGNOSIS — R42 Dizziness and giddiness: Secondary | ICD-10-CM | POA: Diagnosis not present

## 2020-10-22 DIAGNOSIS — R0609 Other forms of dyspnea: Secondary | ICD-10-CM

## 2020-10-22 NOTE — Progress Notes (Signed)
Enrolled patient for a 3 day Zio XT Monitor to be mailed to patients home.  °

## 2020-10-22 NOTE — Progress Notes (Signed)
Cardiology Office Note:    Date:  10/24/2020   ID:  Craig [name], DOB April 02, 1961, MRN [id number]  PCP:  Gerlene Fee, DO  Cardiologist:  Donato Heinz, MD  Electrophysiologist:  None   Referring MD: Gerlene Fee, DO   Chief Complaint  Patient presents with  . Dizziness    History of Present Illness:    Craig [name] is a 60 y.o. male with a hx of gastric cancer, T2DM, GERD, morbid obesity, hyperlipidemia, hypertension who presents for follow-up.  He was referred by Dr. Ardelia Mems for evaluation of orthostatic hypotension, initially seen on 01/07/2020.  Victoza, gabapentin, valsartan, and carvedilol were discontinued.  He reports he has been having dizziness x3 to 4 weeks.  Occurring multiple times a day.  States that sometimes feels like room is spinning and sometimes just feels lightheaded.  Denies any syncopal episode.  Does report occasional palpitations where he feels like his heart is racing.  He cannot identify cause of dizziness.  Denies relationship to position change.  Does report some improvement since stopping medications.  States that prior to the pandemic, he was bowling 4 times per week and swimming 3 times per week.  Has gained 80 pounds since the pandemic started.  Quit smoking years ago.  Father had MI at age 49.  TTE on 01/05/2020 showed LVEF 55 to 60%, normal RV function, no significant valvular disease.  Zio patch x4 days on 02/20/2020 showed frequent PVCs (12% of beats).  Started on Toprol-XL 25 mg daily.  Since last clinic visit, he reports that he has been doing okay.  Has had 2 falls, described as mechanical falls, denies any lightheadedness or syncope.  He injured his knee during one of the falls and has not been walking.  MRI was planned but unable to get due to his size.  Denies any chest pain, dyspnea, lightheadedness, syncope.  Reports occasional palpitations.   Wt Readings from Last 3 Encounters:  10/22/20 (!) 433 lb (196.4 kg)  06/15/20 (!)  420 lb 9.6 oz (190.8 kg)  04/13/20 (!) 405 lb (183.7 kg)     Past Medical History:  Diagnosis Date  . Anemia 12/15/2014  . Anxiety and depression 08/17/2017  . Bilateral carpal tunnel syndrome 08/03/2016  . Cancer ([place]) abdomen  . Controlled type 2 diabetes mellitus without complication, without long-term current use of insulin ([place]) 09/17/2012   Well-controlled.  No history of insulin use.  . Dizziness 03/27/2012  . GERD (gastroesophageal reflux disease) 03/27/2012  . History of gastric cancer 02/14/2012   Followed by Dr. Wynetta Emery in [place].   . Hyperlipidemia associated with type 2 diabetes mellitus ([place]) 01/01/2013  . Morbid obesity with BMI of 50.0-59.9, adult ([place]) 06/05/2012  . Onychomycosis 08/27/2018  . Orthostatic hypotension 11/10/2019  . Perioral numbness 07/30/2018  . Poor dentition 12/02/2018  . Primary hypertension 04/30/2012  . Right hand paresthesia 03/26/2017    Past Surgical History:  Procedure Laterality Date  . ABDOMINAL SURGERY      Current Medications: Current Meds  Medication Sig  . antiseptic oral rinse (BIOTENE) LIQD 15 mLs by Mouth Rinse route as needed for dry mouth.  Marland Kitchen aspirin 81 MG EC tablet TAKE 1 TABLET BY MOUTH EVERY DAY  . atorvastatin (LIPITOR) 80 MG tablet TAKE 1 TABLET BY MOUTH EVERY DAY  . Blood Glucose Monitoring Suppl (ACCU-CHEK GUIDE) w/Device KIT Inject 1 kit into the skin daily.  . diclofenac Sodium (VOLTAREN) 1 % GEL Apply 2 g topically 4 (four) times  daily.  . gabapentin (NEURONTIN) 100 MG capsule Take 1 capsule (100 mg total) by mouth 3 (three) times daily.  Marland Kitchen glucose blood (ACCU-CHEK GUIDE) test strip Use as instructed  . irbesartan (AVAPRO) 150 MG tablet TAKE 1 TABLET BY MOUTH EVERYDAY AT BEDTIME  . Lancets (ACCU-CHEK MULTICLIX) lancets Use as instructed  . metFORMIN (GLUCOPHAGE-XR) 500 MG 24 hr tablet Take 1 tablet (500 mg total) by mouth daily with breakfast.  . metoprolol succinate (TOPROL XL) 50 MG 24 hr tablet Take 1 tablet (50 mg  total) by mouth daily.  . Multiple Vitamins-Minerals (VITAMINS TO GO MEN) MISC Take 1 each by mouth in the morning, at noon, and at bedtime. Take 1 GOLI supplement three time daily  . pantoprazole (PROTONIX) 40 MG tablet TAKE 1 TABLET BY MOUTH EVERY DAY  . terbinafine (LAMISIL AT) 1 % cream Apply 1 application topically 2 (two) times daily. (Patient taking differently: Apply 1 application topically daily.)     Allergies:   Patient has no known allergies.   Social History   Socioeconomic History  . Marital status: Single    Spouse name: Not on file  . Number of children: Not on file  . Years of education: Not on file  . Highest education level: Not on file  Occupational History  . Not on file  Tobacco Use  . Smoking status: Former Smoker    Start date: 08/14/1977    Quit date: 08/14/1996    Years since quitting: 24.2  . Smokeless tobacco: Never Used  Substance and Sexual Activity  . Alcohol use: No  . Drug use: Yes    Types: Cocaine, Marijuana, Heroin    Comment: Previous Hx of substance abuse  . Sexual activity: Yes  Other Topics Concern  . Not on file  Social History Narrative  . Not on file   Social Determinants of Health   Financial Resource Strain: Not on file  Food Insecurity: Not on file  Transportation Needs: Not on file  Physical Activity: Not on file  Stress: Not on file  Social Connections: Not on file     Family History: [name] family history includes Diabetes type II in his mother.  ROS:   Please see the history of present illness.     All other systems reviewed and are negative.  EKGs/Labs/Other Studies Reviewed:    The following studies were reviewed today:   EKG:  EKG is ordered today.  The ekg ordered today demonstrates normal sinus rhythm, PVC, rate 92   TTE 01/05/20: 1. Left ventricular ejection fraction, by estimation, is 55 to 60%. The  left ventricle has normal function. The left ventricle has no regional  wall motion  abnormalities. Left ventricular diastolic parameters were  normal.  2. Right ventricular systolic function is normal. The right ventricular  size is normal. Tricuspid regurgitation signal is inadequate for assessing  PA pressure.  3. The mitral valve is grossly normal. No evidence of mitral valve  regurgitation. No evidence of mitral stenosis.  4. The aortic valve is tricuspid. Aortic valve regurgitation is not  visualized. Mild to moderate aortic valve sclerosis/calcification is  present, without any evidence of aortic stenosis.  5. The inferior vena cava is normal in size with greater than 50%  respiratory variability, suggesting right atrial pressure of 3 mmHg.   Recent Labs: 11/20/2019: ALT 38; TSH 0.677 01/08/2020: BUN 9; Creatinine, Ser 1.04; Potassium 4.3; Sodium 140 02/19/2020: Hemoglobin 11.3; Platelets 248  Recent Lipid Panel  Component Value Date/Time   CHOL 144 04/11/2019 1506   TRIG 78 04/11/2019 1506   HDL 43 04/11/2019 1506   CHOLHDL 3.3 04/11/2019 1506   CHOLHDL 4.3 06/16/2015 0921   VLDL 13 06/16/2015 0921   LDLCALC 85 04/11/2019 1506   LDLDIRECT 90 07/13/2017 1606   LDLDIRECT 88 02/04/2016 1115    Physical Exam:    VS:  BP 134/82   Pulse 92   Ht _0  (1.778 m)   Wt (!) 433 lb (196.4 kg)   BMI 62.13 kg/m     Wt Readings from Last 3 Encounters:  10/22/20 (!) 433 lb (196.4 kg)  06/15/20 (!) 420 lb 9.6 oz (190.8 kg)  04/13/20 (!) 405 lb (183.7 kg)     GEN:  n no acute distress HEENT: Normal NECK: No JVD; No carotid bruits LYMPHATICS: No lymphadenopathy CARDIAC:RRR, no murmurs, distant heart sounds RESPIRATORY:  Clear to auscultation without rales, wheezing or rhonchi  ABDOMEN: Soft, non-tender, non-distended MUSCULOSKELETAL:  No edema; No deformity  SKIN: Warm and dry NEUROLOGIC:  Alert and oriented x 3 PSYCHIATRIC:  Normal affect   ASSESSMENT:    1. Frequent PVCs   2. Morbid obesity ([place])   3. DOE (dyspnea on exertion)   4. Snoring    5. Hypertension, unspecified type   6. Hyperlipidemia, unspecified hyperlipidemia type   7. Lightheadedness    PLAN:    Frequent PVCs: 12% of beats on Zio patch on 02/20/2020.  Currently on Toprol-XL to 50 mg daily, will recheck Zio patch  Dyspnea: Suspect deconditioning contributing, but could represent anginal equivalent.  Will plan for [facility].  Lightheadedness: Unclear cause.  Orthostatics in clinic were negative for drop in blood pressure, but did have significant increase in heart rate.  Could represent POTS.  TTE shows no structural heart disease.  Given palpitations and PVCs on EKG, Zio patch x4 days was checked and showed frequent PVCs (12% of beats).  Started on Toprol-XL as above.  Hypertension: On irbesartan 150 mg daily and Toprol-XL 50 mg daily.  Appears controlled  Hyperlipidemia: On atorvastatin 80 mg daily.  LDL 85 on 04/11/2019  T2DM: On Metformin 500 mg daily.  A1c 5.6 on 10/15/2019  Morbid obesity:Body mass index is 62.13 kg/m.  Will refer to healthy weight and wellness  Snoring: Will check sleep study  RTC in 3 months  Shared Decision Making/Informed Consent The risks [chest pain, shortness of breath, cardiac arrhythmias, dizziness, blood pressure fluctuations, myocardial infarction, stroke/transient ischemic attack, nausea, vomiting, allergic reaction, radiation exposure, metallic taste sensation and life-threatening complications (estimated to be 1 in 10,000)], benefits (risk stratification, diagnosing coronary artery disease, treatment guidance) and alternatives of a nuclear stress test were discussed in detail with Craig [name] and he agrees to proceed.       Medication Adjustments/Labs and Tests Ordered: Current medicines are reviewed at length with the patient today.  Concerns regarding medicines are outlined above.  Orders Placed This Encounter  Procedures  . Ambulatory referral to [facility]  . Cardiac Stress Test: Informed Consent Details:  Physician/Practitioner Attestation; Transcribe to consent form and obtain patient signature  . LONG TERM MONITOR (3-14 DAYS)  . EKG 12-Lead   No orders of the defined types were placed in this encounter.   Patient Instructions  Medication Instructions:  Your physician recommends that you continue on your current medications as directed. Please refer to the Current Medication list given to you today.  *If you need a refill on your cardiac medications  before your next appointment, please call your pharmacy*  Testing/Procedures: Reschedule lexiscan myoview (2 day) at [place] Instructions   Your physician has requested you wear your ZIO patch monitor 3 days.   This is a single patch monitor.  Irhythm supplies one patch monitor per enrollment.  Additional stickers are not available.   Please do not apply patch if you will be having a Nuclear Stress Test, Echocardiogram, Cardiac CT, MRI, or Chest Xray during the time frame you would be wearing the monitor. The patch cannot be worn during these tests.  You cannot remove and re-apply the ZIO XT patch monitor.   Your ZIO patch monitor will be sent USPS Priority mail from [facility] directly to your home address. The monitor may also be mailed to a PO BOX if home delivery is not available.   It may take 3-5 days to receive your monitor after you have been enrolled.   Once you have received you monitor, please review enclosed instructions.  Your monitor has already been registered assigning a specific monitor serial # to you.   Applying the monitor   Shave hair from upper left chest.   Hold abrader disc by orange tab.  Rub abrader in 40 strokes over left upper chest as indicated in your monitor instructions.   Clean area with 4 enclosed alcohol pads .  Use all pads to assure are is cleaned thoroughly.  Let dry.   Apply patch as indicated in monitor instructions.  Patch will be place under  collarbone on left side of chest with arrow pointing upward.   Rub patch adhesive wings for 2 minutes.Remove white label marked "1".  Remove white label marked "2".  Rub patch adhesive wings for 2 additional minutes.   While looking in a mirror, press and release button in center of patch.  A small green light will flash 3-4 times .  This will be your only indicator the monitor has been turned on.     Do not shower for the first 24 hours.  You may shower after the first 24 hours.   Press button if you feel a symptom. You will hear a small click.  Record Date, Time and Symptom in the Patient Log Book.   When you are ready to remove patch, follow instructions on last 2 pages of Patient Log Book.  Stick patch monitor onto last page of Patient Log Book.   Place Patient Log Book in [place] box.  Use locking tab on box and tape box closed securely.  The [employer] and [employer] has [employer] on it.  Please place in mailbox as soon as possible.  Your physician should have your test results approximately 7 days after the monitor has been mailed back to [facility].   Call [place] at [phone] if you have questions regarding your ZIO XT patch monitor.  Call them immediately if you see an orange light blinking on your monitor.   If your monitor falls off in less than 4 days contact our Monitor department at [phone].  If your monitor becomes loose or falls off after 4 days call [employer] at [phone] for suggestions on securing your monitor.     Follow-Up: At [facility], you and your health needs are our priority.  As part of our continuing mission to provide you with exceptional heart care, we have created designated Provider Care Teams.  These Care Teams include your primary Cardiologist (  physician) and Advanced Practice Providers (APPs -  Physician Assistants and Nurse Practitioners) who all work together to provide you with the care you need, when you need  it.  We recommend signing up for the patient portal called "MyChart".  Sign up information is provided on this After Visit Summary.  MyChart is used to connect with patients for Virtual Visits (Telemedicine).  Patients are able to view lab/test results, encounter notes, upcoming appointments, etc.  Non-urgent messages can be sent to your provider as well.   To learn more about what you can do with MyChart, go to [email or web address].    Your next appointment:   3 month(s)  The format for your next appointment:   In Person  Provider:   Oswaldo Milian, MD   Other Instructions You have been referred to Dr. Neil Cr[facility]      Signed, Donato Heinz, MD  10/24/2020 7:04 PM    [place]

## 2020-10-22 NOTE — Patient Instructions (Signed)
Medication Instructions:  Your physician recommends that you continue on your current medications as directed. Please refer to the Current Medication list given to you today.  *If you need a refill on your cardiac medications before your next appointment, please call your pharmacy*  Testing/Procedures: Reschedule lexiscan myoview (2 day) at [place] Instructions   Your physician has requested you wear your ZIO patch monitor 3 days.   This is a single patch monitor.  Ir[employer] supplies one patch monitor per enrollment.  Additional stickers are not available.   Please do not apply patch if you will be having a Nuclear Stress Test, Echocardiogram, Cardiac CT, MRI, or Chest Xray during the time frame you would be wearing the monitor. The patch cannot be worn during these tests.  You cannot remove and re-apply the ZIO XT patch monitor.   Your ZIO patch monitor will be sent USPS Priority mail from [facility] directly to your home address. The monitor may also be mailed to a PO BOX if home delivery is not available.   It may take 3-5 days to receive your monitor after you have been enrolled.   Once you have received you monitor, please review enclosed instructions.  Your monitor has already been registered assigning a specific monitor serial # to you.   Applying the monitor   Shave hair from upper left chest.   Hold abrader disc by orange tab.  Rub abrader in 40 strokes over left upper chest as indicated in your monitor instructions.   Clean area with 4 enclosed alcohol pads .  Use all pads to assure are is cleaned thoroughly.  Let dry.   Apply patch as indicated in monitor instructions.  Patch will be place under collarbone on left side of chest with arrow pointing upward.   Rub patch adhesive wings for 2 minutes.Remove white label marked "1".  Remove white label marked "2".  Rub patch adhesive wings for 2 additional minutes.   While looking in a  mirror, press and release button in center of patch.  A small green light will flash 3-4 times .  This will be your only indicator the monitor has been turned on.     Do not shower for the first 24 hours.  You may shower after the first 24 hours.   Press button if you feel a symptom. You will hear a small click.  Record Date, Time and Symptom in the Patient Log Book.   When you are ready to remove patch, follow instructions on last 2 pages of Patient Log Book.  Stick patch monitor onto last page of Patient Log Book.   Place Patient Log Book in [place] box.  Use locking tab on box and tape box closed securely.  The [employer] has [employer] on it.  Please place in mailbox as soon as possible.  Your physician should have your test results approximately 7 days after the monitor has been mailed back to [facility].   Call [place] at [phone] if you have questions regarding your ZIO XT patch monitor.  Call them immediately if you see an orange light blinking on your monitor.   If your monitor falls off in less than 4 days contact our Monitor department at [phone].  If your monitor becomes loose or falls off after 4 days call [employer] at [phone] for suggestions on securing your monitor.     Follow-Up: At [facility], you  and your health needs are our priority.  As part of our continuing mission to provide you with exceptional heart care, we have created designated Provider Care Teams.  These Care Teams include your primary Cardiologist (physician) and Advanced Practice Providers (APPs -  Physician Assistants and Nurse Practitioners) who all work together to provide you with the care you need, when you need it.  We recommend signing up for the patient portal called "MyChart".  Sign up information is provided on this After Visit Summary.  MyChart is used to connect with patients for Virtual Visits (Telemedicine).  Patients are able to view lab/test  results, encounter notes, upcoming appointments, etc.  Non-urgent messages can be sent to your provider as well.   To learn more about what you can do with MyChart, go to [email or web address].    Your next appointment:   3 month(s)  The format for your next appointment:   In Person  Provider:   Oswaldo Milian, MD   Other Instructions You have been referred to Dr. Neil Crouch [facility]

## 2020-10-25 ENCOUNTER — Encounter: Payer: Self-pay | Admitting: Cardiology

## 2020-10-25 ENCOUNTER — Telehealth: Payer: Self-pay | Admitting: Cardiology

## 2020-10-25 NOTE — Telephone Encounter (Signed)
Spoke with patient regarding 2 day Leane Call appointment at [facility] 12/10/20 and Wednesday 12/01/20 at 1:00 pm at [address].  3 month follow up scheduled Tuesday 01/25/21 with Dr/ Audie Box.  Will mail information to patient and asked him to call with questions of conerns

## 2020-10-30 DIAGNOSIS — I493 Ventricular premature depolarization: Secondary | ICD-10-CM | POA: Diagnosis not present

## 2020-11-02 ENCOUNTER — Ambulatory Visit (INDEPENDENT_AMBULATORY_CARE_PROVIDER_SITE_OTHER): Payer: Medicaid Other | Admitting: Student in an Organized Health Care Education/Training Program

## 2020-11-02 ENCOUNTER — Other Ambulatory Visit: Payer: Self-pay

## 2020-11-02 VITALS — BP 130/60 | HR 93 | Ht 70.0 in | Wt >= 6400 oz

## 2020-11-02 DIAGNOSIS — R42 Dizziness and giddiness: Secondary | ICD-10-CM

## 2020-11-02 DIAGNOSIS — E1169 Type 2 diabetes mellitus with other specified complication: Secondary | ICD-10-CM | POA: Diagnosis not present

## 2020-11-02 DIAGNOSIS — Z6841 Body Mass Index (BMI) 40.0 and over, adult: Secondary | ICD-10-CM

## 2020-11-02 DIAGNOSIS — E785 Hyperlipidemia, unspecified: Secondary | ICD-10-CM

## 2020-11-02 DIAGNOSIS — E1140 Type 2 diabetes mellitus with diabetic neuropathy, unspecified: Secondary | ICD-10-CM | POA: Diagnosis not present

## 2020-11-02 LAB — POCT GLYCOSYLATED HEMOGLOBIN (HGB A1C): HbA1c, POC (controlled diabetic range): 6.5 % (ref 0.0–7.0)

## 2020-11-02 NOTE — Assessment & Plan Note (Addendum)
Discussed considering bariatric surgery, which the pt is open to continue considering in the future. Also discussed personal care services with his caregiver [name], who works for [place] care services. Will refer pt to Social Work for aid in pursuing personal care services.

## 2020-11-02 NOTE — Assessment & Plan Note (Addendum)
A1C increased to 6.5 today from 6.3 last July 2021. Victoza discontinued in the interim.  Advised increasing 500mg  Metformin daily to 500mg  BID, with plans to increase to 1000mg  BID. Will consider adding PO Ozempic.

## 2020-11-02 NOTE — Assessment & Plan Note (Signed)
Will check lipid profile

## 2020-11-02 NOTE — Progress Notes (Signed)
    SUBJECTIVE:   CHIEF COMPLAINT / HPI: Falls  Craig [name] presents today with his caregiver, Craig [name]. Craig [name] notes that he has experienced two falls in the last couple months. He was evaluated by his cardiologist in the interim who felt the pt's light-headedness had an unclear cause (negative orthostatics) but began Toprol-XL for frequent PVCs (12% of beats on Zio patch). The pt denies any more falls in the interim. He denies any recent palpitations, syncope, light headedness, dizziness, CP, SOB, polyuria or polydypsia.   At the end of 2020 the pt has lost 68 pounds after increasing his exercises with water aerobics and bowling, in addition to meal planning and eating more chicken, fish and vegetables. However since then, he has gained his weight back and endorses feeling that his weight and physical deconditioning has much to do with his falls. Regarding his current diet he notes he has "good days and bad days." He denies smoking cigarettes or using any drugs, and endorses infrequent alcohol consumption.  The patient is nervous that he may fall again and has purchased a mobile alert. He would also like an evaluation for personal care services as he is unable to physically clean his back in the shower and feels limited in hygiene upkeep. He has begun walking with a cane to aid with balance.  PERTINENT  PMH / PSH:  Hypertension Type 2 DM Hyperlipidemia Morbid Obesity Dizziness   OBJECTIVE:   BP 130/60   Pulse 93   Ht 5\' 10"  (1.778 m)   Wt (!) 434 lb 9.6 oz (197.1 kg)   SpO2 95%   BMI 62.36 kg/m   Physical Exam Vitals reviewed.  Constitutional:      Appearance: He is obese.  HENT:     Head: Normocephalic and atraumatic.     Nose: Nose normal.  Cardiovascular:     Rate and Rhythm: Normal rate.  Pulmonary:     Effort: Pulmonary effort is normal.     Comments: No nail clubbing Skin:    General: Skin is warm and dry.     Capillary Refill: Capillary refill takes less than 2  seconds.  Neurological:     General: No focal deficit present.     Mental Status: He is alert and oriented to person, place, and time.  Psychiatric:        Mood and Affect: Mood normal.        Behavior: Behavior normal.      ASSESSMENT/PLAN:   Type 2 diabetes mellitus with diabetic neuropathy, without long-term current use of insulin (HCC) A1C increased to 6.5 today from 6.3 last July 2021. Victoza discontinued in the interim.  Advised increasing 500mg  Metformin daily to 500mg  BID, with plans to increase to 1000mg  BID. Will consider adding PO Ozempic.  Hyperlipidemia associated with type 2 diabetes mellitus ([place]) Will check lipid profile  Dizziness Will refer to PT for help with learning exercises at home to improve conditioning, balance and stability  Morbid obesity with BMI of 50.0-59.9, adult [name]) Discussed considering bariatric surgery, which the pt is open to continue considering in the future. Also discussed personal care services with his caregiver Craig [name], who works for [name]. Will refer pt to Social Work for aid in pursuing personal care services.     [place]

## 2020-11-02 NOTE — Assessment & Plan Note (Addendum)
Will refer to PT for help with learning exercises at home to improve conditioning, balance and stability

## 2020-11-02 NOTE — Progress Notes (Unsigned)
Lipids Increase metformin semaglutide  Home health +PT Home exercises Bariatric surg

## 2020-11-02 NOTE — Patient Instructions (Signed)
It was a pleasure to see you today!  To summarize our discussion for this visit:  For your diabetes:  I would like you to slowly increase your metformin dose. Start taking 500mg  twice per day. If you do well with that, increase your dose again until you have slowly reached up to 1,000mg  twice per day.   I would like you to consider taking semaglutide if you do well on the higher metformin.   For weight loss:  I will order home health for aid and PT to help with safety at home.   Additionally, below is more information to consider and think on weight loss surgery  Some additional health maintenance measures we should update are: Health Maintenance Due  Topic Date Due  . COVID-19 Vaccine (1) Never done  . COLONOSCOPY (Pts 45-53yrs Insurance coverage will need to be confirmed)  Never done  . OPHTHALMOLOGY EXAM  01/15/2019  . FOOT EXAM  04/10/2020  .    Call the clinic at [phone] if your symptoms worsen or you have any concerns.   Thank you for allowing me to take part in your care,  Dr. Doristine Mango   Bariatric Surgery Information Bariatric surgery, also called weight loss surgery, is a procedure that helps you lose weight. You may consider, or your health care provider may suggest, bariatric surgery if:  You are severely obese and have been unable to lose weight through diet and exercise.  You have health problems related to obesity, such as: ? Type 2 diabetes. ? Heart disease. ? Lung disease. How does bariatric surgery help me lose weight? Bariatric surgery helps you lose weight by:  Decreasing how much food your body absorbs. This is done by closing off part of your stomach to make it smaller. This restricts the amount of food your stomach can hold.  Changing your body's regular digestive process so that food bypasses the parts of your body that absorb calories and nutrients. If you decide to have bariatric surgery, it is important to continue to eat a healthy  diet and exercise regularly after the surgery. What are the different kinds of bariatric surgery? There are two kinds of bariatric surgeries:  Restrictive surgery. This procedure makes your stomach smaller. It does not change your digestive process. The smaller the size of your new stomach, the less food you can eat. There are different types of restrictive surgeries.  Malabsorptive surgery. This procedure makes your stomach smaller and alters your digestive process so that your body processes less calories and nutrients. These are the most common kind of bariatric surgery. There are different types of malabsorptive surgeries. What are the different types of restrictive surgery? Adjustable Gastric Banding In this procedure, an inflatable band is placed around your stomach near the upper end. This makes the passageway for food into the rest of your stomach much smaller. The band can be adjusted, making it tighter or looser, by filling it with salt solution. Your surgeon can adjust the band based on how you are feeling and how much weight you are losing. The band can be removed in the future. This requires another surgery. Sleeve Gastrectomy In this procedure, your stomach is made smaller. This is done by surgically removing a large part of your stomach. When your stomach is smaller, you feel full more quickly and reduce how much you eat. What are the different types of malabsorptive surgery? Roux-en-Y Gastric Bypass (RGB) This is the most common weight loss surgery. In this procedure, a  small stomach pouch (gastric pouch) is created in the upper part of your stomach. Next, this gastric pouch is attached directly to the middle part of your small intestine. The farther down your small intestine the new connection is made, the fewer calories and nutrients you will absorb. This surgery has the highest rate of complications. Biliopancreatic Diversion with Duodenal Switch (BPD/DS) This is a multi-step  procedure. First, a large part of your stomach is removed, making your stomach smaller. Next, this smaller stomach is attached to the lower part of your small intestine. Like the RGB surgery, you absorb fewer calories and nutrients the farther down your small intestine the attachment is made.   What are the risks of bariatric surgery? As with any surgical procedure, each type of bariatric surgery has its own risks. These risks also depend on your age, your overall health, and any other medical conditions you may have. When deciding on bariatric surgery, it is very important to:  Talk to your health care provider and choose the surgery that is best for you.  Ask your health care provider about specific risks for the surgery you choose. Generally, the risks of bariatric surgery include:  Infection.  Bleeding.  Not getting enough nutrients from food (nutritional deficiencies).  Failure of the device or procedure. This may require another surgery to correct the problem. Where to find more information  American Society for Metabolic & Bariatric Surgery: www.asmbs.org  Weight-control Information Network (WIN): win.AmenCredit.is Summary  Bariatric surgery, also called weight loss surgery, is a procedure that helps you lose weight.  This surgery may be recommended if you have diabetes, heart disease, or lung disease.  Generally, risks of bariatric surgery include infection, bleeding, and failure of the surgery or device, which may require another surgery to correct the problem. This information is not intended to replace advice given to you by your health care provider. Make sure you discuss any questions you have with your health care provider. Document Revised: 12/04/2019 Document Reviewed: 12/04/2019 Elsevier Patient Education  2021 [place] Oral Tablets What is this medicine? SEMAGLUTIDE (Sem a GLOO tide) controls blood sugar in people with type 2 diabetes. It is used  with lifestyle changes like diet and exercise. This medicine may be used for other purposes; ask your health care provider or pharmacist if you have questions. COMMON BRAND NAME(S): Rybelsus What should I tell my health care provider before I take this medicine? They need to know if you have any of these conditions:  endocrine tumors (MEN 2) or if someone in your family had these tumors  eye disease  history of pancreatitis  kidney disease  stomach or intestine problems  thyroid cancer or if someone in your family had thyroid cancer  vision problems  an unusual or allergic reaction to semaglutide, other medicines, foods, dyes, or preservatives  pregnant or trying to get pregnant  breast-feeding How should I use this medicine? Take this medicine by mouth. Take it as directed on the prescription label at the same time every day. Take the dose right after waking up. Do not eat or drink anything before taking it. Do not take it with any other drink except a glass of plain water that is less than 4 ounces (less than 120 mL). Do not cut, crush or chew this medicine. Swallow the tablets whole. After taking it, do not eat breakfast, drink, or take any other medicines or vitamins for at least 30 minutes. Keep taking it unless your  health care provider tells you to stop. A special MedGuide will be given to you by the pharmacist with each prescription and refill. Be sure to read this information carefully each time. Talk to your health care provider about the use of this medicine in children. Special care may be needed. Overdosage: If you think you have taken too much of this medicine contact a poison control center or emergency room at once. NOTE: This medicine is only for you. Do not share this medicine with others. What if I miss a dose? If you miss a dose, skip it. Take your next dose at the normal time. Do not take extra or 2 doses at the same time to make up for the missed dose. What may  interact with this medicine? What may interact with this medicine?  aminophylline  carbamazepine  cyclosporine  digoxin  levothyroxine  other medicines for diabetes  phenytoin  tacrolimus  theophylline  warfarin Many medications may cause changes in blood sugar, these include:  alcohol containing beverages  antiviral medicines for HIV or AIDS  aspirin and aspirin-like drugs  certain medicines for blood pressure, heart disease, irregular heart beat  chromium  diuretics  male hormones, such as estrogens or progestins, birth control pills  fenofibrate  gemfibrozil  isoniazid  lanreotide  male hormones or anabolic steroids  MAOIs like Carbex, Eldepryl, Marplan, Nardil, and Parnate  medicines for weight loss  medicines for allergies, asthma, cold, or cough  medicines for depression, anxiety, or psychotic disturbances  niacin  nicotine  NSAIDs, medicines for pain and inflammation, like ibuprofen or naproxen  octreotide  pasireotide  pentamidine  phenytoin  probenecid  quinolone antibiotics such as ciprofloxacin, levofloxacin, ofloxacin  some herbal dietary supplements  steroid medicines such as prednisone or cortisone  sulfamethoxazole; trimethoprim  thyroid hormones Some medications can hide the warning symptoms of low blood sugar (hypoglycemia). You may need to monitor your blood sugar more closely if you are taking one of these medications. These include:  beta-blockers, often used for high blood pressure or heart problems (examples include atenolol, metoprolol, propranolol)  clonidine  guanethidine  reserpine This list may not describe all possible interactions. Give your health care provider a list of all the medicines, herbs, non-prescription drugs, or dietary supplements you use. Also tell them if you smoke, drink alcohol, or use illegal drugs. Some items may interact with your medicine. What should I watch for while using  this medicine? Visit your health care provider for regular checks on your progress. Check with your health care provider if you have severe diarrhea, nausea, and vomiting, or if you sweat a lot. The loss of too much body fluid may make it dangerous for you to take this medicine. A test called the HbA1C (A1C) will be monitored. This is a simple blood test. It measures your blood sugar control over the last 2 to 3 months. You will receive this test every 3 to 6 months. Learn how to check your blood sugar. Learn the symptoms of low and high blood sugar and how to manage them. Always carry a quick-source of sugar with you in case you have symptoms of low blood sugar. Examples include hard sugar candy or glucose tablets. Make sure others know that you can choke if you eat or drink when you develop serious symptoms of low blood sugar, such as seizures or unconsciousness. Get medical help at once. Tell your health care provider if you have high blood sugar. You might need to change  the dose of your medicine. If you are sick or exercising more than usual, you might need to change the dose of your medicine. Do not skip meals. Ask your health care provider if you should avoid alcohol. Many nonprescription cough and cold products contain sugar or alcohol. These can affect blood sugar. Wear a medical ID bracelet or chain. Carry a card that describes your condition. List the medicines and doses you take on the card. Do not become pregnant while taking this medicine. Women should inform their health care provider if they wish to become pregnant or think they might be pregnant. There is a potential for serious side effects to an unborn child. Talk to your health care provider for more information. Do not breast-feed an infant while taking this medicine. What side effects may I notice from receiving this medicine? Side effects that you should report to your doctor or health care provider as soon as possible:  allergic  reactions (skin rash, itching or hives; swelling of the face, lips, or tongue)  changes in vision  diarrhea that continues or is severe  infection (fever, chills, cough, sore throat, pain or trouble passing urine)  kidney injury (trouble passing urine or change in the amount of urine)  low blood sugar (feeling anxious; confusion; dizziness; increased hunger; unusually weak or tired; increased sweating; shakiness; cold, clammy skin; irritable; headache; blurred vision; fast heartbeat; loss of consciousness)  lump or swelling on the neck  painful or difficulty swallowing  severe nausea  severe or unusual stomach pain  trouble breathing  vomiting Side effects that usually do not require medical attention (report these to your doctor or health care provider if they continue or are bothersome):  constipation  diarrhea  nausea  upset stomach This list may not describe all possible side effects. Call your doctor for medical advice about side effects. You may report side effects to FDA at 1-800-FDA-1088. Where should I keep my medicine? Keep out of the reach of children and pets. Store at room temperature between 20 and 25 degrees C (68 and 77 degrees F). Keep this medicine in the original container. Protect from moisture. Keep the container tightly closed. Get rid of any unused medicine after the expiration date. To get rid of medicines that are no longer needed or have expired:  Take the medicine to a medicine take-back program. Check with your pharmacy or law enforcement to find a location.  If you cannot return the medicine, check the label or package insert to see if the medicine should be thrown out in the garbage or flushed down the toilet. If you are not sure, ask your health care provider. If it is safe to put it in the trash, take the medicine out of the container. Mix the medicine with cat litter, dirt, coffee grounds, or other unwanted substance. Seal the mixture in a bag  or container. Put it in the trash. NOTE: This sheet is a summary. It may not cover all possible information. If you have questions about this medicine, talk to your doctor, pharmacist, or health care provider.  2021 Elsevier/Gold Standard (2020-06-28 15:08:33)

## 2020-11-16 ENCOUNTER — Telehealth: Payer: Self-pay | Admitting: *Deleted

## 2020-11-16 NOTE — Telephone Encounter (Signed)
Anda Kraft from [facility] calling for PT verbal orders as follows:  1 time(s) weekly for 9 week(s) for Walking, balance and exercise.  Verbal orders given per [facility] protocol.  Christen Bame, CMA

## 2020-11-17 ENCOUNTER — Other Ambulatory Visit: Payer: Self-pay

## 2020-11-17 ENCOUNTER — Other Ambulatory Visit: Payer: Self-pay | Admitting: *Deleted

## 2020-11-17 ENCOUNTER — Encounter (INDEPENDENT_AMBULATORY_CARE_PROVIDER_SITE_OTHER): Payer: Medicaid Other | Admitting: Ophthalmology

## 2020-11-17 DIAGNOSIS — H35411 Lattice degeneration of retina, right eye: Secondary | ICD-10-CM | POA: Diagnosis not present

## 2020-11-17 DIAGNOSIS — H43813 Vitreous degeneration, bilateral: Secondary | ICD-10-CM

## 2020-11-17 DIAGNOSIS — H35033 Hypertensive retinopathy, bilateral: Secondary | ICD-10-CM

## 2020-11-17 DIAGNOSIS — H2513 Age-related nuclear cataract, bilateral: Secondary | ICD-10-CM | POA: Diagnosis not present

## 2020-11-17 DIAGNOSIS — H33301 Unspecified retinal break, right eye: Secondary | ICD-10-CM

## 2020-11-17 DIAGNOSIS — I10 Essential (primary) hypertension: Secondary | ICD-10-CM

## 2020-11-17 MED ORDER — METOPROLOL SUCCINATE ER 50 MG PO TB24: 50.0000 mg | ORAL_TABLET | Freq: Two times a day (BID) | ORAL | 3 refills | Status: DC

## 2020-11-18 ENCOUNTER — Other Ambulatory Visit: Payer: Self-pay | Admitting: Family Medicine

## 2020-11-18 DIAGNOSIS — I10 Essential (primary) hypertension: Secondary | ICD-10-CM

## 2020-11-29 ENCOUNTER — Other Ambulatory Visit: Payer: Self-pay | Admitting: *Deleted

## 2020-11-30 ENCOUNTER — Ambulatory Visit (HOSPITAL_COMMUNITY): Payer: Medicaid Other

## 2020-11-30 ENCOUNTER — Telehealth (HOSPITAL_COMMUNITY): Payer: Self-pay | Admitting: Cardiology

## 2020-11-30 MED ORDER — ACCU-CHEK MULTICLIX LANCETS MISC: 12 refills | Status: DC

## 2020-11-30 NOTE — Telephone Encounter (Signed)
FYI. Thanks.

## 2020-11-30 NOTE — Telephone Encounter (Signed)
Just an FYI. We have made several attempts to contact this patient including sending a letter to schedule or reschedule their Myoview. We will be removing the patient from the [facility].    [name] has cancelled his nuclear appointment yet again at the last minute. This makes 6 times since last August. He will not be going back on our schedule. Obviously he does not want to come for the test. He has occupied very valuable appointment times on our schedule, we have wasted money on ordering radiopharmaceuticals for his tests, and each time he has been on the schedule it has caused our very overworked insurance team to have to get his precert. This has been a waste of multiple resources, again 6 times. 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

## 2020-12-01 ENCOUNTER — Ambulatory Visit (HOSPITAL_COMMUNITY): Payer: Medicaid Other

## 2020-12-03 ENCOUNTER — Other Ambulatory Visit: Payer: Self-pay

## 2020-12-03 DIAGNOSIS — E119 Type 2 diabetes mellitus without complications: Secondary | ICD-10-CM

## 2020-12-03 MED ORDER — METFORMIN HCL ER 500 MG PO TB24
500.0000 mg | ORAL_TABLET | Freq: Two times a day (BID) | ORAL | 0 refills | Status: DC
Start: 2020-12-03 — End: 2020-12-20

## 2020-12-07 ENCOUNTER — Telehealth: Payer: Self-pay

## 2020-12-07 DIAGNOSIS — Z760 Encounter for issue of repeat prescription: Secondary | ICD-10-CM

## 2020-12-07 DIAGNOSIS — B353 Tinea pedis: Secondary | ICD-10-CM

## 2020-12-07 DIAGNOSIS — E1140 Type 2 diabetes mellitus with diabetic neuropathy, unspecified: Secondary | ICD-10-CM

## 2020-12-07 NOTE — Telephone Encounter (Signed)
Patient calls nurse line requesting to increase metformin dose. In office visit on 11/02/20, it was discussed that patient would increase to 1000 mg BID. Patient has been taking 500 mg BID with no adverse side effects. Please advise how patient should increase or if patient needs to be seen again prior to increasing dose.   Will route to PCP and Dr. Ouida Sills (saw patient last for diabetes management)  Craig Grumbling, RN

## 2020-12-09 ENCOUNTER — Encounter (INDEPENDENT_AMBULATORY_CARE_PROVIDER_SITE_OTHER): Payer: Medicaid Other | Admitting: Ophthalmology

## 2020-12-15 ENCOUNTER — Encounter (INDEPENDENT_AMBULATORY_CARE_PROVIDER_SITE_OTHER): Payer: Medicaid Other | Admitting: Ophthalmology

## 2020-12-15 ENCOUNTER — Other Ambulatory Visit: Payer: Self-pay

## 2020-12-15 DIAGNOSIS — H33301 Unspecified retinal break, right eye: Secondary | ICD-10-CM

## 2020-12-20 ENCOUNTER — Other Ambulatory Visit: Payer: Self-pay | Admitting: Family Medicine

## 2020-12-20 DIAGNOSIS — E119 Type 2 diabetes mellitus without complications: Secondary | ICD-10-CM

## 2021-01-11 ENCOUNTER — Telehealth: Payer: Self-pay

## 2021-01-11 NOTE — Telephone Encounter (Signed)
Craig [name] [facility] PT calls nurse line requesting verbal orders for [facility] PT as follows.   1x a week for 9 weeks.   Verbal order given per [facility] protocol.

## 2021-01-14 ENCOUNTER — Telehealth: Payer: Self-pay

## 2021-01-14 DIAGNOSIS — G8929 Other chronic pain: Secondary | ICD-10-CM

## 2021-01-14 DIAGNOSIS — E119 Type 2 diabetes mellitus without complications: Secondary | ICD-10-CM

## 2021-01-14 DIAGNOSIS — E785 Hyperlipidemia, unspecified: Secondary | ICD-10-CM

## 2021-01-14 DIAGNOSIS — E1169 Type 2 diabetes mellitus with other specified complication: Secondary | ICD-10-CM

## 2021-01-14 NOTE — Telephone Encounter (Signed)
Patient calls nurse line requesting referral to [facility] Dr. Hessie Knows, as he is unable to get MRI due to issues with insurance. Patient states he would like to see specialist for knee pain and issues with mobility.   Please advise.   Talbot Grumbling, RN

## 2021-01-14 NOTE — Telephone Encounter (Signed)
Received phone call from Elgie Congo at [facility] regarding patient. Requesting status on [employer] forms that were faxed over last week.   Forms have been placed in provider's box. Please advise.   Talbot Grumbling, RN

## 2021-01-15 MED ORDER — ATORVASTATIN CALCIUM 80 MG PO TABS: 1.0000 | ORAL_TABLET | Freq: Every day | ORAL | 3 refills | Status: DC

## 2021-01-15 MED ORDER — METFORMIN HCL ER 500 MG PO TB24: 500.0000 mg | ORAL_TABLET | Freq: Two times a day (BID) | ORAL | 0 refills | Status: DC

## 2021-01-15 NOTE — Telephone Encounter (Signed)
Form filled out. Placed in to be faxed pile.   Gerlene Fee, DO 01/15/2021, 4:02 PM PGY-2, [place]

## 2021-01-23 NOTE — Progress Notes (Signed)
Cardiology Office Note:    Date:  01/25/2021   ID:  Craig [name], DOB Feb 13, 1961, MRN [id number]  PCP:  Gerlene Fee, DO  Cardiologist:  Donato Heinz, MD  Electrophysiologist:  None   Referring MD: Gerlene Fee, DO   No chief complaint on file.   History of Present Illness:    Craig [name] is a 60 y.o. male with a hx of gastric cancer, T2DM, GERD, morbid obesity, hyperlipidemia, hypertension who presents for follow-up.  He was referred by Dr. Ardelia Mems for evaluation of orthostatic hypotension, initially seen on 01/07/2020.  Victoza, gabapentin, valsartan, and carvedilol were discontinued.  He reports he has been having dizziness x3 to 4 weeks.  Occurring multiple times a day.  States that sometimes feels like room is spinning and sometimes just feels lightheaded.  Denies any syncopal episode.  Does report occasional palpitations where he feels like his heart is racing.  He cannot identify cause of dizziness.  Denies relationship to position change.  Does report some improvement since stopping medications.  States that prior to the pandemic, he was bowling 4 times per week and swimming 3 times per week.  Has gained 80 pounds since the pandemic started.  Quit smoking years ago.  Father had MI at age 37.  TTE on 01/05/2020 showed LVEF 55 to 60%, normal RV function, no significant valvular disease.  Zio patch x4 days on 02/20/2020 showed frequent PVCs (12% of beats).  Started on Toprol-XL, was titrated to 50 mg daily.  Repeat Zio patch x2 days on 11/08/2020 continue to show frequent PVCs (10% of beats).  Toprol-XL dose increased to 50 mg twice daily.  Since last clinic visit, reports he is doing better.  He brought his BP log with him, has been 90s to 130s over 70s to 80s.  He denies any chest pain but does report some dyspnea with exertion.  He reduced his evening Toprol-XL dose to 25 mg daily due to lightheadedness.  Reports no lightheadedness since that time.  Denies any  syncope.  Reports occasional lower extremity edema.  Reports he is doing PT and mobility has improved.  Has been walking twice daily for 10 minutes.   Wt Readings from Last 3 Encounters:  11/02/20 (!) 434 lb 9.6 oz (197.1 kg)  10/22/20 (!) 433 lb (196.4 kg)  06/15/20 (!) 420 lb 9.6 oz (190.8 kg)     Past Medical History:  Diagnosis Date   Anemia 12/15/2014   Anxiety and depression 08/17/2017   Bilateral carpal tunnel syndrome 08/03/2016   Cancer ([place]) abdomen   Controlled type 2 diabetes mellitus without complication, without long-term current use of insulin ([place]) 09/17/2012   Well-controlled.  No history of insulin use.   Dizziness 03/27/2012   GERD (gastroesophageal reflux disease) 03/27/2012   History of gastric cancer 02/14/2012   Followed by Dr. Wynetta Emery in [place].    Hyperlipidemia associated with type 2 diabetes mellitus ([place]) 01/01/2013   Morbid obesity with BMI of 50.0-59.9, adult ([place]) 06/05/2012   Onychomycosis 08/27/2018   Orthostatic hypotension 11/10/2019   Perioral numbness 07/30/2018   Poor dentition 12/02/2018   Primary hypertension 04/30/2012   Right hand paresthesia 03/26/2017    Past Surgical History:  Procedure Laterality Date   ABDOMINAL SURGERY      Current Medications: Current Meds  Medication Sig   antiseptic oral rinse (BIOTENE) LIQD 15 mLs by Mouth Rinse route as needed for dry mouth.   aspirin (ASPIRIN LOW DOSE) 81 MG EC tablet TAKE  1 TABLET BY MOUTH EVERY DAY   atorvastatin (LIPITOR) 80 MG tablet Take 1 tablet (80 mg total) by mouth daily.   Blood Glucose Monitoring Suppl (ACCU-CHEK GUIDE) w/Device KIT Inject 1 kit into the skin daily.   diclofenac Sodium (VOLTAREN) 1 % GEL Apply 2 g topically 4 (four) times daily.   gabapentin (NEURONTIN) 100 MG capsule Take 1 capsule (100 mg total) by mouth 3 (three) times daily.   glucose blood (ACCU-CHEK GUIDE) test strip Use as instructed   irbesartan (AVAPRO) 150 MG tablet TAKE 1 TABLET BY MOUTH EVERYDAY AT BEDTIME    Lancets (ACCU-CHEK MULTICLIX) lancets Use as instructed   metFORMIN (GLUCOPHAGE-XR) 500 MG 24 hr tablet Take 1 tablet (500 mg total) by mouth 2 (two) times daily.   metoprolol succinate (TOPROL XL) 50 MG 24 hr tablet Take 1 tablet (50 mg total) by mouth 2 (two) times daily.   Multiple Vitamins-Minerals (VITAMINS TO GO MEN) MISC Take 1 each by mouth in the morning, at noon, and at bedtime. Take 1 GOLI supplement three time daily   pantoprazole (PROTONIX) 40 MG tablet TAKE 1 TABLET BY MOUTH EVERY DAY   terbinafine (LAMISIL AT) 1 % cream Apply 1 application topically 2 (two) times daily. (Patient taking differently: Apply 1 application topically daily.)     Allergies:   Patient has no known allergies.   Social History   Socioeconomic History   Marital status: Single    Spouse name: Not on file   Number of children: Not on file   Years of education: Not on file   Highest education level: Not on file  Occupational History   Not on file  Tobacco Use   Smoking status: Former    Pack years: 0.00    Types: Cigarettes    Start date: 08/14/1977    Quit date: 08/14/1996    Years since quitting: 24.4   Smokeless tobacco: Never  Substance and Sexual Activity   Alcohol use: No   Drug use: Yes    Types: Cocaine, Marijuana, Heroin    Comment: Previous Hx of substance abuse   Sexual activity: Yes  Other Topics Concern   Not on file  Social History Narrative   Not on file   Social Determinants of Health   Financial Resource Strain: Not on file  Food Insecurity: Not on file  Transportation Needs: Not on file  Physical Activity: Not on file  Stress: Not on file  Social Connections: Not on file     Family History: [name] family history includes Diabetes type II in his mother.  ROS:   Please see the history of present illness.     All other systems reviewed and are negative.  EKGs/Labs/Other Studies Reviewed:    The following studies were reviewed today:   EKG:  EKG is  ordered today.  The ekg ordered today demonstrates normal sinus rhythm, rate 76, no ST abnormalities, RAE, no PVCs   TTE 01/05/20:  1. Left ventricular ejection fraction, by estimation, is 55 to 60%. The  left ventricle has normal function. The left ventricle has no regional  wall motion abnormalities. Left ventricular diastolic parameters were  normal.   2. Right ventricular systolic function is normal. The right ventricular  size is normal. Tricuspid regurgitation signal is inadequate for assessing  PA pressure.   3. The mitral valve is grossly normal. No evidence of mitral valve  regurgitation. No evidence of mitral stenosis.   4. The aortic valve is tricuspid. Aortic  valve regurgitation is not  visualized. Mild to moderate aortic valve sclerosis/calcification is  present, without any evidence of aortic stenosis.   5. The inferior vena cava is normal in size with greater than 50%  respiratory variability, suggesting right atrial pressure of 3 mmHg.   Recent Labs: 02/19/2020: Hemoglobin 11.3; Platelets 248  Recent Lipid Panel    Component Value Date/Time   CHOL 144 04/11/2019 1506   TRIG 78 04/11/2019 1506   HDL 43 04/11/2019 1506   CHOLHDL 3.3 04/11/2019 1506   CHOLHDL 4.3 06/16/2015 0921   VLDL 13 06/16/2015 0921   LDLCALC 85 04/11/2019 1506   LDLDIRECT 90 07/13/2017 1606   LDLDIRECT 88 02/04/2016 1115    Physical Exam:    VS:  BP (!) 169/84   Pulse 76   Ht '5\' 9"'  (1.753 m)   SpO2 97%   BMI 64.18 kg/m     Wt Readings from Last 3 Encounters:  11/02/20 (!) 434 lb 9.6 oz (197.1 kg)  10/22/20 (!) 433 lb (196.4 kg)  06/15/20 (!) 420 lb 9.6 oz (190.8 kg)     GEN:  n no acute distress HEENT: Normal NECK: No JVD; No carotid bruits CARDIAC:RRR, no murmurs, distant heart sounds RESPIRATORY:  Clear to auscultation without rales, wheezing or rhonchi  ABDOMEN: Soft, non-tender, non-distended MUSCULOSKELETAL:  No edema; No deformity  SKIN: Warm and dry NEUROLOGIC:  Alert  and oriented x 3 PSYCHIATRIC:  Normal affect   ASSESSMENT:    1. Frequent PVCs   2. DOE (dyspnea on exertion)   3. Lightheadedness   4. Hypertension, unspecified type   5. Hyperlipidemia, unspecified hyperlipidemia type   6. Morbid obesity ([place])   7. Snoring     PLAN:    Frequent PVCs: 12% of beats on Zio patch on 02/20/2020.   Started on Toprol-XL, was titrated to 50 mg daily.  Repeat Zio patch x2 days on 11/08/2020 continue to show frequent PVCs (10% of beats).  Toprol-XL dose increased to 50 mg twice daily, but reported lightheadedness with evening dose, so currently taking metoprolol XL 50 mg every morning and 25 mg nightly.  Tolerating this dose well, will continue  Dyspnea: Suspect deconditioning contributing, but could represent anginal equivalent.  Had planned for [facility], but patient declines  Lightheadedness: Unclear cause.  Orthostatics in clinic were negative for drop in blood pressure, but did have significant increase in heart rate.  Could represent POTS.  TTE shows no structural heart disease.  Given palpitations and PVCs on EKG, Zio patch x4 days was checked and showed frequent PVCs (12% of beats).  Started on Toprol-XL as above.  He reports lightheadedness has improved.  Hypertension: On irbesartan 150 mg daily and Toprol-XL 49m daily.  BP elevated to 169/84 in clinic today, but was 122/70 on recheck.  Appears controlled  Hyperlipidemia: On atorvastatin 80 mg daily.  LDL 85 on 04/11/2019  T2DM: On Metformin 500 mg daily.  A1c 6.5% on 11/03/2020  Morbid obesity:Body mass index is 64.18 kg/m.  Referred to healthy weight and wellness, the patient missed appointment and is declining another referral.  He wants to work on diet and exercise now that his mobility has improved with physical therapy.    Snoring: Recommend sleep study but patient declines at this time  RTC in 3 months     Medication Adjustments/Labs and Tests Ordered: Current medicines are reviewed  at length with the patient today.  Concerns regarding medicines are outlined above.  Orders Placed This Encounter  Procedures   EKG 12-Lead    No orders of the defined types were placed in this encounter.   Patient Instructions  Medication Instructions:  Your physician recommends that you continue on your current medications as directed. Please refer to the Current Medication list given to you today.  *If you need a refill on your cardiac medications before your next appointment, please call your pharmacy*  Follow-Up: At [facility], you and your health needs are our priority.  As part of our continuing mission to provide you with exceptional heart care, we have created designated Provider Care Teams.  These Care Teams include your primary Cardiologist (physician) and Advanced Practice Providers (APPs -  Physician Assistants and Nurse Practitioners) who all work together to provide you with the care you need, when you need it.  We recommend signing up for the patient portal called "MyChart".  Sign up information is provided on this After Visit Summary.  MyChart is used to connect with patients for Virtual Visits (Telemedicine).  Patients are able to view lab/test results, encounter notes, upcoming appointments, etc.  Non-urgent messages can be sent to your provider as well.   To learn more about what you can do with MyChart, go to [email or web address].    Your next appointment:   3 month(s)  The format for your next appointment:   In Person  Provider:   Oswaldo Milian, MD      Signed, Donato Heinz, MD  01/25/2021 5:31 PM    [place]

## 2021-01-25 ENCOUNTER — Ambulatory Visit (INDEPENDENT_AMBULATORY_CARE_PROVIDER_SITE_OTHER): Payer: Medicaid Other | Admitting: Cardiology

## 2021-01-25 ENCOUNTER — Other Ambulatory Visit: Payer: Self-pay

## 2021-01-25 ENCOUNTER — Encounter: Payer: Self-pay | Admitting: Cardiology

## 2021-01-25 VITALS — BP 169/84 | HR 76 | Ht 69.0 in

## 2021-01-25 DIAGNOSIS — E785 Hyperlipidemia, unspecified: Secondary | ICD-10-CM | POA: Diagnosis not present

## 2021-01-25 DIAGNOSIS — I493 Ventricular premature depolarization: Secondary | ICD-10-CM

## 2021-01-25 DIAGNOSIS — R0600 Dyspnea, unspecified: Secondary | ICD-10-CM | POA: Diagnosis not present

## 2021-01-25 DIAGNOSIS — R0683 Snoring: Secondary | ICD-10-CM

## 2021-01-25 DIAGNOSIS — R42 Dizziness and giddiness: Secondary | ICD-10-CM

## 2021-01-25 DIAGNOSIS — R0609 Other forms of dyspnea: Secondary | ICD-10-CM

## 2021-01-25 DIAGNOSIS — I10 Essential (primary) hypertension: Secondary | ICD-10-CM | POA: Diagnosis not present

## 2021-01-25 NOTE — Patient Instructions (Signed)
Medication Instructions:  Your physician recommends that you continue on your current medications as directed. Please refer to the Current Medication list given to you today.  *If you need a refill on your cardiac medications before your next appointment, please call your pharmacy*  Follow-Up: At [facility], you and your health needs are our priority.  As part of our continuing mission to provide you with exceptional heart care, we have created designated Provider Care Teams.  These Care Teams include your primary Cardiologist (physician) and Advanced Practice Providers (APPs -  Physician Assistants and Nurse Practitioners) who all work together to provide you with the care you need, when you need it.  We recommend signing up for the patient portal called "MyChart".  Sign up information is provided on this After Visit Summary.  MyChart is used to connect with patients for Virtual Visits (Telemedicine).  Patients are able to view lab/test results, encounter notes, upcoming appointments, etc.  Non-urgent messages can be sent to your provider as well.   To learn more about what you can do with MyChart, go to [email or web address].    Your next appointment:   3 month(s)  The format for your next appointment:   In Person  Provider:   Christopher Schumann, MD    

## 2021-01-26 DIAGNOSIS — M25561 Pain in right knee: Secondary | ICD-10-CM | POA: Diagnosis not present

## 2021-01-27 DIAGNOSIS — M25561 Pain in right knee: Secondary | ICD-10-CM | POA: Diagnosis not present

## 2021-01-31 DIAGNOSIS — M25561 Pain in right knee: Secondary | ICD-10-CM | POA: Diagnosis not present

## 2021-02-01 ENCOUNTER — Ambulatory Visit (INDEPENDENT_AMBULATORY_CARE_PROVIDER_SITE_OTHER): Payer: Medicaid Other | Admitting: Family Medicine

## 2021-02-01 ENCOUNTER — Encounter: Payer: Self-pay | Admitting: Family Medicine

## 2021-02-01 ENCOUNTER — Other Ambulatory Visit: Payer: Self-pay

## 2021-02-01 VITALS — BP 143/97 | HR 86 | Wt >= 6400 oz

## 2021-02-01 DIAGNOSIS — G8929 Other chronic pain: Secondary | ICD-10-CM

## 2021-02-01 DIAGNOSIS — E1169 Type 2 diabetes mellitus with other specified complication: Secondary | ICD-10-CM

## 2021-02-01 DIAGNOSIS — M25561 Pain in right knee: Secondary | ICD-10-CM | POA: Diagnosis not present

## 2021-02-01 DIAGNOSIS — E1140 Type 2 diabetes mellitus with diabetic neuropathy, unspecified: Secondary | ICD-10-CM

## 2021-02-01 DIAGNOSIS — M25562 Pain in left knee: Secondary | ICD-10-CM | POA: Diagnosis not present

## 2021-02-01 DIAGNOSIS — Z1211 Encounter for screening for malignant neoplasm of colon: Secondary | ICD-10-CM

## 2021-02-01 DIAGNOSIS — E785 Hyperlipidemia, unspecified: Secondary | ICD-10-CM

## 2021-02-01 DIAGNOSIS — I10 Essential (primary) hypertension: Secondary | ICD-10-CM | POA: Diagnosis not present

## 2021-02-01 LAB — POCT GLYCOSYLATED HEMOGLOBIN (HGB A1C): HbA1c, POC (controlled diabetic range): 6.6 % (ref 0.0–7.0)

## 2021-02-01 MED ORDER — DICLOFENAC SODIUM 1 % EX GEL: 2.0000 g | Freq: Four times a day (QID) | CUTANEOUS | 0 refills | Status: DC

## 2021-02-01 NOTE — Progress Notes (Signed)
    SUBJECTIVE:   CHIEF COMPLAINT / HPI:   Craig [name] is a 60 yo M who presents for the below.   Diabetes Current Regimen: Metformin 500 mg BID; recently increased 1 month prior CBGs: BID  Last A1c: 6.5 on 11/02/2020  Denies polyuria, polydipsia, hypoglycemia Last Eye Exam: 08/18/2020 Statin: lipitor 40 mg daily  ACE/ARB: Irbesartan 150 mg qhs  Hypertension: - Medications: Irbesartan 150 mg qhs, metoprolol 50 mg BID or 50 mg AM & 25 MG if dizziness occurs (per cardiology) - Compliance: Yes - Checking BP at home: Yes, 120s SBP - Denies any SOB, CP, vision changes, LE edema, medication SEs, or symptoms of hypotension  PERTINENT  PMH / PSH: Orthostatic hypotension, GERD, Dizziness, Anemia   OBJECTIVE:   BP (!) 143/97   Pulse 86   Wt (!) 422 lb 3.2 oz (191.5 kg)   SpO2 100%   BMI 62.35 kg/m   Diabetic Foot Exam - Simple   Simple Foot Form Visual Inspection No deformities, no ulcerations, no other skin breakdown bilaterally: Yes Sensation Testing Intact to touch and monofilament testing bilaterally: Yes Pulse Check Posterior Tibialis and Dorsalis pulse intact bilaterally: Yes Comments Multiple discolored and hardened toenails.     General: Appears well, no acute distress. Age appropriate. Cardiac: RRR, normal heart sounds, no murmurs Respiratory: CTAB, normal effort   ASSESSMENT/PLAN:   Primary hypertension Elevated today. Goal <140/90.  - Lipid Panel - Continue medication  Type 2 diabetes mellitus with diabetic neuropathy, without long-term current use of insulin (H[facility]) Hgb A1c 6.6.  - Lipid Panel - HM Diabetes Foot Exam - Continue metformin 500 BID - F/u in 3 months   Hyperlipidemia associated with type 2 diabetes mellitus ([facility]) Last panel 04/11/2019 unremarkable.  - Lipid Panel - Continue lipitor 80 mg daily   Screening for colon cancer Gave patient handout with number highlighted to call to schedule. - Ambulatory referral to Gastroenterology  Craig [name], [name]

## 2021-02-01 NOTE — Patient Instructions (Signed)
It was wonderful to see you today.  Please bring ALL of your medications with you to every visit.   Today we talked about:  Diabetes Continue current medications we will look for improvement at follow-up visit in 3 months.  High blood pressure Continue current medications.  Continue to check blood pressure at home goal is less than 140/90  Please see handout for colonoscopy.  Please be sure to schedule follow up at the front  desk before you leave today.   If you haven't already, sign up for My Chart to have easy access to your labs results, and communication with your primary care physician.  Please call the clinic at [phone] if your symptoms worsen or you have any concerns. It was our pleasure to serve you.  Dr. Janus Molder

## 2021-02-02 DIAGNOSIS — M25561 Pain in right knee: Secondary | ICD-10-CM | POA: Diagnosis not present

## 2021-02-02 LAB — LIPID PANEL
Chol/HDL Ratio: 4.3 ratio (ref 0.0–5.0)
Cholesterol, Total: 142 mg/dL (ref 100–199)
HDL: 33 mg/dL — ABNORMAL LOW (ref >39)
LDL Chol Calc (NIH): 90 mg/dL (ref 0–99)
Triglycerides: 102 mg/dL (ref 0–149)
VLDL Cholesterol Cal: 19 mg/dL (ref 5–40)

## 2021-02-03 DIAGNOSIS — M25561 Pain in right knee: Secondary | ICD-10-CM | POA: Diagnosis not present

## 2021-02-07 ENCOUNTER — Encounter: Payer: Self-pay | Admitting: Family Medicine

## 2021-02-07 DIAGNOSIS — M25561 Pain in right knee: Secondary | ICD-10-CM | POA: Diagnosis not present

## 2021-02-08 DIAGNOSIS — M25561 Pain in right knee: Secondary | ICD-10-CM | POA: Diagnosis not present

## 2021-02-09 DIAGNOSIS — M25561 Pain in right knee: Secondary | ICD-10-CM | POA: Diagnosis not present

## 2021-02-10 DIAGNOSIS — M25561 Pain in right knee: Secondary | ICD-10-CM | POA: Diagnosis not present

## 2021-02-14 DIAGNOSIS — M25561 Pain in right knee: Secondary | ICD-10-CM | POA: Diagnosis not present

## 2021-02-15 DIAGNOSIS — M25561 Pain in right knee: Secondary | ICD-10-CM | POA: Diagnosis not present

## 2021-02-16 DIAGNOSIS — M25561 Pain in right knee: Secondary | ICD-10-CM | POA: Diagnosis not present

## 2021-02-17 DIAGNOSIS — M1711 Unilateral primary osteoarthritis, right knee: Secondary | ICD-10-CM | POA: Diagnosis not present

## 2021-02-17 DIAGNOSIS — M1712 Unilateral primary osteoarthritis, left knee: Secondary | ICD-10-CM | POA: Diagnosis not present

## 2021-02-17 DIAGNOSIS — M25561 Pain in right knee: Secondary | ICD-10-CM | POA: Diagnosis not present

## 2021-02-21 DIAGNOSIS — M25561 Pain in right knee: Secondary | ICD-10-CM | POA: Diagnosis not present

## 2021-02-22 DIAGNOSIS — M25561 Pain in right knee: Secondary | ICD-10-CM | POA: Diagnosis not present

## 2021-02-23 DIAGNOSIS — M25561 Pain in right knee: Secondary | ICD-10-CM | POA: Diagnosis not present

## 2021-02-24 DIAGNOSIS — M25561 Pain in right knee: Secondary | ICD-10-CM | POA: Diagnosis not present

## 2021-02-28 DIAGNOSIS — M25561 Pain in right knee: Secondary | ICD-10-CM | POA: Diagnosis not present

## 2021-03-01 DIAGNOSIS — M25561 Pain in right knee: Secondary | ICD-10-CM | POA: Diagnosis not present

## 2021-03-02 DIAGNOSIS — M25561 Pain in right knee: Secondary | ICD-10-CM | POA: Diagnosis not present

## 2021-03-03 DIAGNOSIS — M25561 Pain in right knee: Secondary | ICD-10-CM | POA: Diagnosis not present

## 2021-03-07 DIAGNOSIS — M25561 Pain in right knee: Secondary | ICD-10-CM | POA: Diagnosis not present

## 2021-03-08 DIAGNOSIS — M25561 Pain in right knee: Secondary | ICD-10-CM | POA: Diagnosis not present

## 2021-03-09 DIAGNOSIS — M25561 Pain in right knee: Secondary | ICD-10-CM | POA: Diagnosis not present

## 2021-03-10 DIAGNOSIS — M25561 Pain in right knee: Secondary | ICD-10-CM | POA: Diagnosis not present

## 2021-03-14 DIAGNOSIS — M25561 Pain in right knee: Secondary | ICD-10-CM | POA: Diagnosis not present

## 2021-03-15 DIAGNOSIS — M25561 Pain in right knee: Secondary | ICD-10-CM | POA: Diagnosis not present

## 2021-03-16 DIAGNOSIS — M25561 Pain in right knee: Secondary | ICD-10-CM | POA: Diagnosis not present

## 2021-03-17 ENCOUNTER — Ambulatory Visit: Payer: Medicaid Other | Admitting: Pharmacist

## 2021-03-17 DIAGNOSIS — M25561 Pain in right knee: Secondary | ICD-10-CM | POA: Diagnosis not present

## 2021-03-21 ENCOUNTER — Other Ambulatory Visit: Payer: Self-pay | Admitting: Family Medicine

## 2021-03-21 DIAGNOSIS — M25561 Pain in right knee: Secondary | ICD-10-CM | POA: Diagnosis not present

## 2021-03-22 DIAGNOSIS — M25561 Pain in right knee: Secondary | ICD-10-CM | POA: Diagnosis not present

## 2021-03-23 DIAGNOSIS — M25561 Pain in right knee: Secondary | ICD-10-CM | POA: Diagnosis not present

## 2021-03-24 DIAGNOSIS — M25561 Pain in right knee: Secondary | ICD-10-CM | POA: Diagnosis not present

## 2021-03-28 DIAGNOSIS — M25561 Pain in right knee: Secondary | ICD-10-CM | POA: Diagnosis not present

## 2021-03-29 DIAGNOSIS — M25561 Pain in right knee: Secondary | ICD-10-CM | POA: Diagnosis not present

## 2021-03-30 DIAGNOSIS — M25561 Pain in right knee: Secondary | ICD-10-CM | POA: Diagnosis not present

## 2021-03-31 DIAGNOSIS — M25561 Pain in right knee: Secondary | ICD-10-CM | POA: Diagnosis not present

## 2021-04-03 ENCOUNTER — Other Ambulatory Visit: Payer: Self-pay | Admitting: Family Medicine

## 2021-04-03 DIAGNOSIS — E119 Type 2 diabetes mellitus without complications: Secondary | ICD-10-CM

## 2021-04-04 DIAGNOSIS — M25561 Pain in right knee: Secondary | ICD-10-CM | POA: Diagnosis not present

## 2021-04-05 DIAGNOSIS — M25561 Pain in right knee: Secondary | ICD-10-CM | POA: Diagnosis not present

## 2021-04-06 DIAGNOSIS — M25561 Pain in right knee: Secondary | ICD-10-CM | POA: Diagnosis not present

## 2021-04-07 ENCOUNTER — Other Ambulatory Visit: Payer: Self-pay

## 2021-04-07 ENCOUNTER — Encounter: Payer: Self-pay | Admitting: Pharmacist

## 2021-04-07 ENCOUNTER — Ambulatory Visit (INDEPENDENT_AMBULATORY_CARE_PROVIDER_SITE_OTHER): Payer: Medicaid Other | Admitting: Pharmacist

## 2021-04-07 DIAGNOSIS — I10 Essential (primary) hypertension: Secondary | ICD-10-CM | POA: Diagnosis not present

## 2021-04-07 DIAGNOSIS — E1140 Type 2 diabetes mellitus with diabetic neuropathy, unspecified: Secondary | ICD-10-CM | POA: Diagnosis not present

## 2021-04-07 DIAGNOSIS — E785 Hyperlipidemia, unspecified: Secondary | ICD-10-CM | POA: Diagnosis not present

## 2021-04-07 DIAGNOSIS — G8929 Other chronic pain: Secondary | ICD-10-CM | POA: Diagnosis not present

## 2021-04-07 DIAGNOSIS — E1169 Type 2 diabetes mellitus with other specified complication: Secondary | ICD-10-CM

## 2021-04-07 DIAGNOSIS — M25562 Pain in left knee: Secondary | ICD-10-CM | POA: Diagnosis not present

## 2021-04-07 DIAGNOSIS — M25561 Pain in right knee: Secondary | ICD-10-CM | POA: Diagnosis not present

## 2021-04-07 MED ORDER — ATORVASTATIN CALCIUM 80 MG PO TABS: 80.0000 mg | ORAL_TABLET | Freq: Every day | ORAL | 3 refills | Status: DC

## 2021-04-07 MED ORDER — TRULICITY 0.75 MG/0.5ML ~~LOC~~ SOAJ: 0.7500 mg | SUBCUTANEOUS | 1 refills | Status: DC

## 2021-04-07 MED ORDER — DICLOFENAC SODIUM 1 % EX GEL: 2.0000 g | Freq: Four times a day (QID) | CUTANEOUS | 1 refills | Status: DC

## 2021-04-07 MED ORDER — ASPIRIN 81 MG PO TBEC: DELAYED_RELEASE_TABLET | ORAL | 3 refills | Status: DC

## 2021-04-07 MED ORDER — GABAPENTIN 300 MG PO CAPS: 300.0000 mg | ORAL_CAPSULE | Freq: Three times a day (TID) | ORAL | 1 refills | Status: DC

## 2021-04-07 NOTE — Progress Notes (Signed)
S:     Chief Complaint  Patient presents with   Medication Management    Weight management    Patient arrives ambulating with a cane and very short of breath.  Presents for diabetes evaluation, education, and management Patient was referred and last seen by Primary Care Provider on 02/01/21.   Patient reports Diabetes was diagnosed in 2014.   Insurance coverage/medication affordability: Medicaid  Medication adherence reported to be adherent .   Current diabetes medications include: Metformin 500 mg BID Current hypertension medications include: Irbesartan 150 mg daily, metoprolol succinate 50 mg BID (patient takes 50 mg in the morning, and decreases to 25 mg in the evening if dizzy) Current hyperlipidemia medications include: Atorvastatin 80 mg daily  Patient reports hypoglycemic events.  Patient reported dietary habits: Eats 2 meals/day Lunch: Peanut butter and jelly with chips Dinner: Broccoli, air fryer chicken (largest meal of the day) Nurse prep cooks veggies, tuna, healthy foods Drinks:Water, half of glass of pepsi if sugar goes low, 1/2 glass every morning cranberry juice for kidneys  Patient-reported exercise habits: hasn't been able to bowl as much as he used to, every 2-3 weeks, physical therapy w/ nurse every day 10-15 min  O:  Physical Exam Vitals reviewed.  Constitutional:      Appearance: He is obese.  Pulmonary:     Comments: Patient short of breath upon walking to exam room Skin:    Comments: Sweating with exertion on walking  Neurological:     Mental Status: He is alert.   Review of Systems  Respiratory:  Positive for shortness of breath.   All other systems reviewed and are negative.  Lab Results  Component Value Date   HGBA1C 6.6 02/01/2021   Vitals:   04/07/21 1351  BP: 130/68  Pulse: 75  SpO2: 92%    Lipid Panel     Component Value Date/Time   CHOL 142 02/01/2021 1438   TRIG 102 02/01/2021 1438   HDL 33 (L) 02/01/2021 1438    CHOLHDL 4.3 02/01/2021 1438   CHOLHDL 4.3 06/16/2015 0921   VLDL 13 06/16/2015 0921   LDLCALC 90 02/01/2021 1438   LDLDIRECT 90 07/13/2017 1606   LDLDIRECT 88 02/04/2016 1115    Home blood sugars: 7-day avg - 91 14-day avg - 102 30-day avg - 101  Clinical Atherosclerotic Cardiovascular Disease (ASCVD): Yes  The 10-year ASCVD risk score Mikey Bussing DC Jr., et al., 2013) is: 23.4%   Values used to calculate the score:     Age: 60 years     Sex: Male     Is Non-Hispanic African American: Yes     Diabetic: Yes     Tobacco smoker: No     Systolic Blood Pressure: [id number]     Is BP treated: Yes     HDL Cholesterol: 33 mg/dL     Total Cholesterol: 142 mg/dL   A/P: Diabetes longstanding since 2014 currently well controlled - last A1c 6.6 on 02/01/21, and home blood sugars are at goal. Patient is able to verbalize appropriate hypoglycemia management plan. Medication adherence appears optimal. -Start Trulicity (dulaglutide) 0.75 mg once weekly for diabetes/weight management. Counseled patient on proper administration, storage, and possible adverse effects. -Continue Metformin 500 mg BID. -Extensively discussed pathophysiology of diabetes, recommended lifestyle interventions, dietary effects on blood sugar control. Encouraged patient to get back to previous bowling regimen. -Counseled on s/sx of and management of hypoglycemia -Next A1C anticipated in September.   Hypertension longstanding since 2013, currently  controlled with blood pressure in office at goal. Blood pressure goal = < 130/80 mmHg. Medication adherence is optimal.  -Continue irbesartan 150 mg daily and metoprolol succinate 50 mg BID.  History of hyperlipidemia, last LDL 90 on 02/01/21, refilled atorvastatin 80 mg. Patient reported he is out of aspirin and requested a refill, sent in refill on aspirin 81 mg.  History of chronic knee pain, patient requested refill of diclofenac gel, sent refill to pharmacy. Patient also reports lack  of complete pain control with gabapentin 100 mg TID, discussed his pain, potential side effects, and effect of increasing dose, increased to gabapentin 300 mg TID, sent in prescription to pharmacy.  Written patient instructions provided.  Total time in face to face counseling 50 minutes.   Follow up with Dr. Valentina Lucks in clinic in 1 month. Patient seen with Meyer Russel, PharmD Candidate, and Joseph Art, PharmD - PGY-1 Resident.

## 2021-04-07 NOTE — Patient Instructions (Signed)
Nice to see you today Craig [name]!  Start taking Trulicity [id number] mg once weekly. Stop taking gabapentin 100 mg, switch to gabapentin 300 mg three times a day. Continue taking the rest of your medications as prescribed.  Follow-up appointment with Dr. Valentina Lucks in a month.

## 2021-04-07 NOTE — Assessment & Plan Note (Signed)
History of chronic knee pain, patient requested refill of diclofenac gel, sent refill to pharmacy. Patient also reports lack of complete pain control with gabapentin 100 mg TID, discussed his pain, potential side effects, and effect of increasing dose, increased to gabapentin 300 mg TID, sent in prescription to pharmacy.

## 2021-04-07 NOTE — Assessment & Plan Note (Signed)
Diabetes longstanding since 2014 currently well controlled - last A1c 6.6 on 02/01/21, and home blood sugars are at goal. Patient is able to verbalize appropriate hypoglycemia management plan. Medication adherence appears optimal. -Start Trulicity (dulaglutide) 0.75 mg once weekly for diabetes/weight management. Counseled patient on proper administration, storage, and possible adverse effects. -Continue Metformin 500 mg BID. -Extensively discussed pathophysiology of diabetes, recommended lifestyle interventions, dietary effects on blood sugar control. Encouraged patient to get back to previous bowling regimen. -Counseled on s/sx of and management of hypoglycemia

## 2021-04-20 ENCOUNTER — Encounter (INDEPENDENT_AMBULATORY_CARE_PROVIDER_SITE_OTHER): Payer: Medicaid Other | Admitting: Ophthalmology

## 2021-04-20 ENCOUNTER — Other Ambulatory Visit: Payer: Self-pay

## 2021-04-20 DIAGNOSIS — H35033 Hypertensive retinopathy, bilateral: Secondary | ICD-10-CM

## 2021-04-20 DIAGNOSIS — H33301 Unspecified retinal break, right eye: Secondary | ICD-10-CM

## 2021-04-20 DIAGNOSIS — H43813 Vitreous degeneration, bilateral: Secondary | ICD-10-CM | POA: Diagnosis not present

## 2021-04-20 DIAGNOSIS — I10 Essential (primary) hypertension: Secondary | ICD-10-CM

## 2021-04-25 DIAGNOSIS — M25561 Pain in right knee: Secondary | ICD-10-CM | POA: Diagnosis not present

## 2021-04-26 DIAGNOSIS — M25561 Pain in right knee: Secondary | ICD-10-CM | POA: Diagnosis not present

## 2021-04-27 DIAGNOSIS — M25561 Pain in right knee: Secondary | ICD-10-CM | POA: Diagnosis not present

## 2021-04-28 DIAGNOSIS — M25561 Pain in right knee: Secondary | ICD-10-CM | POA: Diagnosis not present

## 2021-05-02 DIAGNOSIS — M25561 Pain in right knee: Secondary | ICD-10-CM | POA: Diagnosis not present

## 2021-05-03 DIAGNOSIS — M25561 Pain in right knee: Secondary | ICD-10-CM | POA: Diagnosis not present

## 2021-05-04 DIAGNOSIS — M25561 Pain in right knee: Secondary | ICD-10-CM | POA: Diagnosis not present

## 2021-05-05 DIAGNOSIS — M25561 Pain in right knee: Secondary | ICD-10-CM | POA: Diagnosis not present

## 2021-05-09 ENCOUNTER — Telehealth: Payer: Self-pay

## 2021-05-09 DIAGNOSIS — M25561 Pain in right knee: Secondary | ICD-10-CM | POA: Diagnosis not present

## 2021-05-09 NOTE — Telephone Encounter (Signed)
Patient calls nurse line stating he is on week 3 of Trulicity injection. Patient reports he was told to call before the 4th injection to see if dosage needs to be adjusted. Patient reports he is tolerating current dose well. Will forward to PCP. Patient will not need 4th injection until next Thursday.

## 2021-05-09 NOTE — Telephone Encounter (Signed)
Reviewed Dr. Graylin Shiver note on trulicity. No dosage change at this time. Patient need A1c follow up.   [place], DO 05/09/2021, 8:35 PM PGY-3, [place]

## 2021-05-10 DIAGNOSIS — M25561 Pain in right knee: Secondary | ICD-10-CM | POA: Diagnosis not present

## 2021-05-11 DIAGNOSIS — M25561 Pain in right knee: Secondary | ICD-10-CM | POA: Diagnosis not present

## 2021-05-12 DIAGNOSIS — M25561 Pain in right knee: Secondary | ICD-10-CM | POA: Diagnosis not present

## 2021-05-16 DIAGNOSIS — M25561 Pain in right knee: Secondary | ICD-10-CM | POA: Diagnosis not present

## 2021-05-16 NOTE — Telephone Encounter (Signed)
LVM informing patient.

## 2021-05-17 DIAGNOSIS — M25561 Pain in right knee: Secondary | ICD-10-CM | POA: Diagnosis not present

## 2021-05-18 DIAGNOSIS — M25561 Pain in right knee: Secondary | ICD-10-CM | POA: Diagnosis not present

## 2021-05-19 DIAGNOSIS — M25561 Pain in right knee: Secondary | ICD-10-CM | POA: Diagnosis not present

## 2021-05-23 DIAGNOSIS — M25561 Pain in right knee: Secondary | ICD-10-CM | POA: Diagnosis not present

## 2021-05-24 DIAGNOSIS — M25561 Pain in right knee: Secondary | ICD-10-CM | POA: Diagnosis not present

## 2021-05-25 DIAGNOSIS — M25561 Pain in right knee: Secondary | ICD-10-CM | POA: Diagnosis not present

## 2021-05-26 DIAGNOSIS — M25561 Pain in right knee: Secondary | ICD-10-CM | POA: Diagnosis not present

## 2021-05-27 ENCOUNTER — Ambulatory Visit: Payer: Medicaid Other | Admitting: Family Medicine

## 2021-05-30 ENCOUNTER — Ambulatory Visit: Payer: Medicaid Other | Admitting: Family Medicine

## 2021-05-30 ENCOUNTER — Ambulatory Visit: Payer: Medicaid Other | Admitting: Cardiology

## 2021-05-30 DIAGNOSIS — M25561 Pain in right knee: Secondary | ICD-10-CM | POA: Diagnosis not present

## 2021-05-31 DIAGNOSIS — M25561 Pain in right knee: Secondary | ICD-10-CM | POA: Diagnosis not present

## 2021-06-01 ENCOUNTER — Encounter: Payer: Self-pay | Admitting: Gastroenterology

## 2021-06-01 DIAGNOSIS — M25561 Pain in right knee: Secondary | ICD-10-CM | POA: Diagnosis not present

## 2021-06-02 DIAGNOSIS — M25561 Pain in right knee: Secondary | ICD-10-CM | POA: Diagnosis not present

## 2021-06-06 ENCOUNTER — Ambulatory Visit (INDEPENDENT_AMBULATORY_CARE_PROVIDER_SITE_OTHER): Payer: Medicaid Other

## 2021-06-06 ENCOUNTER — Other Ambulatory Visit: Payer: Self-pay

## 2021-06-06 DIAGNOSIS — Z23 Encounter for immunization: Secondary | ICD-10-CM

## 2021-06-06 DIAGNOSIS — E1140 Type 2 diabetes mellitus with diabetic neuropathy, unspecified: Secondary | ICD-10-CM

## 2021-06-06 DIAGNOSIS — M25561 Pain in right knee: Secondary | ICD-10-CM | POA: Diagnosis not present

## 2021-06-06 NOTE — Progress Notes (Signed)
Patient presents to nurse clinic for flu vaccination. Administered in RD, site unremarkable, tolerated injection well.   Amaar Oshita C Setsuko Robins, RN  

## 2021-06-07 DIAGNOSIS — M25561 Pain in right knee: Secondary | ICD-10-CM | POA: Diagnosis not present

## 2021-06-08 DIAGNOSIS — M25561 Pain in right knee: Secondary | ICD-10-CM | POA: Diagnosis not present

## 2021-06-08 MED ORDER — TRULICITY 0.75 MG/0.5ML ~~LOC~~ SOAJ: 0.7500 mg | SUBCUTANEOUS | 1 refills | Status: DC

## 2021-06-09 DIAGNOSIS — M25561 Pain in right knee: Secondary | ICD-10-CM | POA: Diagnosis not present

## 2021-06-09 NOTE — Progress Notes (Deleted)
Cardiology Office Note:    Date:  06/09/2021   ID:  SEKOU ZUCKERMAN, DOB [birth year], MRN [id number]  PCP:  Gerlene Fee, DO  Cardiologist:  Donato Heinz, MD  Electrophysiologist:  None   Referring MD: Gerlene Fee, DO   No chief complaint on file.   History of Present Illness:    Craig [name] is a 60 y.o. male with a hx of gastric cancer, T2DM, GERD, morbid obesity, hyperlipidemia, hypertension who presents for follow-up.  He was referred by Dr. Ardelia Mems for evaluation of orthostatic hypotension, initially seen on 01/07/2020.  Victoza, gabapentin, valsartan, and carvedilol were discontinued.  He reports he has been having dizziness x3 to 4 weeks.  Occurring multiple times a day.  States that sometimes feels like room is spinning and sometimes just feels lightheaded.  Denies any syncopal episode.  Does report occasional palpitations where he feels like his heart is racing.  He cannot identify cause of dizziness.  Denies relationship to position change.  Does report some improvement since stopping medications.  States that prior to the pandemic, he was bowling 4 times per week and swimming 3 times per week.  Has gained 80 pounds since the pandemic started.  Quit smoking years ago.  Father had MI at age 53.  TTE on 01/05/2020 showed LVEF 55 to 60%, normal RV function, no significant valvular disease.  Zio patch x4 days on 02/20/2020 showed frequent PVCs (12% of beats).  Started on Toprol-XL, was titrated to 50 mg daily.  Repeat Zio patch x2 days on 11/08/2020 continue to show frequent PVCs (10% of beats).  Toprol-XL dose increased to 50 mg twice daily.  Since last clinic visit,  reports he is doing better.  He brought his BP log with him, has been 90s to 130s over 70s to 80s.  He denies any chest pain but does report some dyspnea with exertion.  He reduced his evening Toprol-XL dose to 25 mg daily due to lightheadedness.  Reports no lightheadedness since that time.  Denies any  syncope.  Reports occasional lower extremity edema.  Reports he is doing PT and mobility has improved.  Has been walking twice daily for 10 minutes.   Wt Readings from Last 3 Encounters:  04/07/21 (!) 426 lb (193.2 kg)  02/01/21 (!) 422 lb 3.2 oz (191.5 kg)  11/02/20 (!) 434 lb 9.6 oz (197.1 kg)     Past Medical History:  Diagnosis Date   Anemia 12/15/2014   Anxiety and depression 08/17/2017   Bilateral carpal tunnel syndrome 08/03/2016   Cancer ([place]) abdomen   Controlled type 2 diabetes mellitus without complication, without long-term current use of insulin ([place]) 09/17/2012   Well-controlled.  No history of insulin use.   Dizziness 03/27/2012   GERD (gastroesophageal reflux disease) 03/27/2012   History of gastric cancer 02/14/2012   Followed by Dr. Wynetta Emery in [place].    Hyperlipidemia associated with type 2 diabetes mellitus ([place]) 01/01/2013   Morbid obesity with BMI of 50.0-59.9, adult ([place]) 06/05/2012   Onychomycosis 08/27/2018   Orthostatic hypotension 11/10/2019   Perioral numbness 07/30/2018   Poor dentition 12/02/2018   Primary hypertension 04/30/2012   Right hand paresthesia 03/26/2017    Past Surgical History:  Procedure Laterality Date   ABDOMINAL SURGERY      Current Medications: No outpatient medications have been marked as taking for the 06/10/21 encounter (Appointment) with Donato Heinz, MD.     Allergies:   Patient has no known allergies.   Social  History   Socioeconomic History   Marital status: Single    Spouse name: Not on file   Number of children: Not on file   Years of education: Not on file   Highest education level: Not on file  Occupational History   Not on file  Tobacco Use   Smoking status: Former    Types: Cigarettes    Start date: 08/14/1977    Quit date: 08/14/1996    Years since quitting: 24.8   Smokeless tobacco: Never  Substance and Sexual Activity   Alcohol use: No   Drug use: Yes    Types: Cocaine, Marijuana, Heroin     Comment: Previous Hx of substance abuse   Sexual activity: Yes  Other Topics Concern   Not on file  Social History Narrative   Not on file   Social Determinants of Health   Financial Resource Strain: Not on file  Food Insecurity: Not on file  Transportation Needs: Not on file  Physical Activity: Not on file  Stress: Not on file  Social Connections: Not on file     Family History: [name] family history includes Diabetes type II in his mother.  ROS:   Please see the history of present illness.     All other systems reviewed and are negative.  EKGs/Labs/Other Studies Reviewed:    The following studies were reviewed today:   EKG:  EKG is ordered today.  The ekg ordered today demonstrates normal sinus rhythm, rate 76, no ST abnormalities, RAE, no PVCs   TTE 01/05/20:  1. Left ventricular ejection fraction, by estimation, is 55 to 60%. The  left ventricle has normal function. The left ventricle has no regional  wall motion abnormalities. Left ventricular diastolic parameters were  normal.   2. Right ventricular systolic function is normal. The right ventricular  size is normal. Tricuspid regurgitation signal is inadequate for assessing  PA pressure.   3. The mitral valve is grossly normal. No evidence of mitral valve  regurgitation. No evidence of mitral stenosis.   4. The aortic valve is tricuspid. Aortic valve regurgitation is not  visualized. Mild to moderate aortic valve sclerosis/calcification is  present, without any evidence of aortic stenosis.   5. The inferior vena cava is normal in size with greater than 50%  respiratory variability, suggesting right atrial pressure of 3 mmHg.   Recent Labs: No results found for requested labs within last 8760 hours.  Recent Lipid Panel    Component Value Date/Time   CHOL 142 02/01/2021 1438   TRIG 102 02/01/2021 1438   HDL 33 (L) 02/01/2021 1438   CHOLHDL 4.3 02/01/2021 1438   CHOLHDL 4.3 06/16/2015 0921   VLDL 13  06/16/2015 0921   LDLCALC 90 02/01/2021 1438   LDLDIRECT 90 07/13/2017 1606   LDLDIRECT 88 02/04/2016 1115    Physical Exam:    VS:  There were no vitals taken for this visit.    Wt Readings from Last 3 Encounters:  04/07/21 (!) 426 lb (193.2 kg)  02/01/21 (!) 422 lb 3.2 oz (191.5 kg)  11/02/20 (!) 434 lb 9.6 oz (197.1 kg)     GEN:  n no acute distress HEENT: Normal NECK: No JVD; No carotid bruits CARDIAC:RRR, no murmurs, distant heart sounds RESPIRATORY:  Clear to auscultation without rales, wheezing or rhonchi  ABDOMEN: Soft, non-tender, non-distended MUSCULOSKELETAL:  No edema; No deformity  SKIN: Warm and dry NEUROLOGIC:  Alert and oriented x 3 PSYCHIATRIC:  Normal affect   ASSESSMENT:  No diagnosis found.   PLAN:    Frequent PVCs: 12% of beats on Zio patch on 02/20/2020.   Started on Toprol-XL, was titrated to 50 mg daily.  Repeat Zio patch x2 days on 11/08/2020 continue to show frequent PVCs (10% of beats).  Toprol-XL dose increased to 50 mg twice daily, but reported lightheadedness with evening dose, so currently taking metoprolol XL 50 mg every morning and 25 mg nightly.  Tolerating this dose well, will continue  Dyspnea: Suspect deconditioning contributing, but could represent anginal equivalent.  Had planned for [facility], but patient declines  Lightheadedness: Unclear cause.  Orthostatics in clinic were negative for drop in blood pressure, but did have significant increase in heart rate.  Could represent POTS.  TTE shows no structural heart disease.  Given palpitations and PVCs on EKG, Zio patch x4 days was checked and showed frequent PVCs (12% of beats).  Started on Toprol-XL as above.  He reports lightheadedness has improved.  Hypertension: On irbesartan 150 mg daily and Toprol-XL 75mg  daily.    Hyperlipidemia: On atorvastatin 80 mg daily.  LDL 85 on 04/11/2019  T2DM: On Metformin 500 mg daily.  A1c 6.5% on 11/03/2020  Morbid obesity:There is no height  or weight on file to calculate BMI.  Referred to healthy weight and wellness, the patient missed appointment and is declining another referral.  He wants to work on diet and exercise now that his mobility has improved with physical therapy.    Snoring: Recommend sleep study but patient declines at this time  RTC in ***     Medication Adjustments/Labs and Tests Ordered: Current medicines are reviewed at length with the patient today.  Concerns regarding medicines are outlined above.  No orders of the defined types were placed in this encounter.   No orders of the defined types were placed in this encounter.   There are no Patient Instructions on file for this visit.   Signed, Donato Heinz, MD  06/09/2021 11:40 PM    [place]

## 2021-06-10 ENCOUNTER — Telehealth: Payer: Self-pay | Admitting: *Deleted

## 2021-06-10 ENCOUNTER — Ambulatory Visit: Payer: Medicaid Other | Admitting: Cardiology

## 2021-06-10 NOTE — Telephone Encounter (Signed)
OV made with Colleen 06-29-21 at 2:30 pm

## 2021-06-10 NOTE — Telephone Encounter (Signed)
Pt's BMI 64.79.  He has multiple health issues HTN, DM, hx of gastric CA.  LMOM to call back to set up an OV with Dr.Armbruster or one of the extenders instead of a PV.  Colonoscopy at the [facility] cancelled

## 2021-06-13 DIAGNOSIS — M25561 Pain in right knee: Secondary | ICD-10-CM | POA: Diagnosis not present

## 2021-06-14 DIAGNOSIS — M25561 Pain in right knee: Secondary | ICD-10-CM | POA: Diagnosis not present

## 2021-06-15 DIAGNOSIS — M25561 Pain in right knee: Secondary | ICD-10-CM | POA: Diagnosis not present

## 2021-06-16 DIAGNOSIS — M25561 Pain in right knee: Secondary | ICD-10-CM | POA: Diagnosis not present

## 2021-06-20 DIAGNOSIS — M25561 Pain in right knee: Secondary | ICD-10-CM | POA: Diagnosis not present

## 2021-06-21 DIAGNOSIS — M25561 Pain in right knee: Secondary | ICD-10-CM | POA: Diagnosis not present

## 2021-06-22 DIAGNOSIS — M25561 Pain in right knee: Secondary | ICD-10-CM | POA: Diagnosis not present

## 2021-06-23 DIAGNOSIS — M25561 Pain in right knee: Secondary | ICD-10-CM | POA: Diagnosis not present

## 2021-06-27 ENCOUNTER — Ambulatory Visit (INDEPENDENT_AMBULATORY_CARE_PROVIDER_SITE_OTHER): Payer: Medicaid Other | Admitting: Family Medicine

## 2021-06-27 ENCOUNTER — Other Ambulatory Visit: Payer: Self-pay | Admitting: Family Medicine

## 2021-06-27 ENCOUNTER — Ambulatory Visit: Payer: Medicaid Other | Admitting: Pharmacist

## 2021-06-27 ENCOUNTER — Other Ambulatory Visit: Payer: Self-pay

## 2021-06-27 ENCOUNTER — Ambulatory Visit: Payer: Medicaid Other | Admitting: Family Medicine

## 2021-06-27 ENCOUNTER — Encounter: Payer: Self-pay | Admitting: Family Medicine

## 2021-06-27 VITALS — BP 127/74 | HR 84 | Ht 68.0 in | Wt >= 6400 oz

## 2021-06-27 DIAGNOSIS — Z6841 Body Mass Index (BMI) 40.0 and over, adult: Secondary | ICD-10-CM

## 2021-06-27 DIAGNOSIS — M25562 Pain in left knee: Secondary | ICD-10-CM

## 2021-06-27 DIAGNOSIS — M25561 Pain in right knee: Secondary | ICD-10-CM

## 2021-06-27 DIAGNOSIS — E1140 Type 2 diabetes mellitus with diabetic neuropathy, unspecified: Secondary | ICD-10-CM | POA: Diagnosis not present

## 2021-06-27 DIAGNOSIS — G8929 Other chronic pain: Secondary | ICD-10-CM

## 2021-06-27 LAB — POCT GLYCOSYLATED HEMOGLOBIN (HGB A1C): HbA1c, POC (prediabetic range): 5.9 % (ref 5.7–6.4)

## 2021-06-27 MED ORDER — TRULICITY 0.75 MG/0.5ML ~~LOC~~ SOAJ: 0.7500 mg | SUBCUTANEOUS | 1 refills | Status: DC

## 2021-06-27 NOTE — Progress Notes (Signed)
    SUBJECTIVE:   CHIEF COMPLAINT / HPI:   Craig [name] is a 60 yo M who presents for the below.  Diabetes Current Regimen: Trulicity 8.18 mg/wkly, metformin 500mg  BID Last A1c: 6.6 on 02/01/21  Denies polyuria, polydipsia, hypoglycemia Statin: Lipitor 80 mg daily ACE/ARB: Irbesartan 150 mg qhs  DME order, chronic knee pain, morbid obesity Was asked by nurse and orthopedic surgeon to reach out to primary care doctor for power wheelchair. He is planning to have bilateral knee surgery 12/7   PERTINENT  PMH / PSH: As above.   OBJECTIVE:   BP 127/74   Pulse 84   Ht 5\' 8"  (1.727 m)   Wt (!) 412 lb 12.8 oz (187.2 kg)   SpO2 100%   BMI 62.77 kg/m   General: Appears well, no acute distress. Age appropriate. Cardiac: RRR, normal heart sounds, no murmurs Respiratory: CTAB, normal effort Extremities: No edema or cyanosis. Skin: Warm and dry, no rashes noted Neuro: alert and oriented, no focal deficits Psych: normal affect   ASSESSMENT/PLAN:   1. Type 2 diabetes mellitus with diabetic neuropathy, without long-term current use of insulin (HCC) A1C today 5.2. Patient desires to increase trulicity for increased weight loss but would like to wait until he follows up with Dr. Valentina Lucks in July. I believe this is wise given his very tight controlled diabetes. Congratulated him on weight loss and A1c number. Refill trulicity today. Plan to follow up with me end of January.  - Dulaglutide (TRULICITY) 5.63 JS/9.7WY SOPN; Inject 0.75 mg into the skin once a week.  Dispense: 2 mL; Refill: 1  2. Morbid obesity with BMI of 50.0-59.9, adult (HCC) Chronic pain of both knees Bilaterally knee surgery scheduled for 07/20/21 per patient request it is bilateral. Encouraged continued mobility until then and after. Would discourage use of wheelchair beyond clearance from ortho.  - DME Wheelchair electric   Gerlene Fee, [place]

## 2021-06-27 NOTE — Patient Instructions (Signed)
Thank you for coming in today good job on lowering your A1c to 5.9.  We will continue the current dose of Trulicity.  Plan to follow-up with Dr. Valentina Lucks in January.   I have put in a medical equipment order for a powered wheelchair for after your knee surgery.  Dr. Janus Molder

## 2021-06-28 DIAGNOSIS — M25561 Pain in right knee: Secondary | ICD-10-CM | POA: Diagnosis not present

## 2021-06-29 ENCOUNTER — Ambulatory Visit (INDEPENDENT_AMBULATORY_CARE_PROVIDER_SITE_OTHER): Payer: Medicaid Other | Admitting: Nurse Practitioner

## 2021-06-29 ENCOUNTER — Encounter: Payer: Self-pay | Admitting: Nurse Practitioner

## 2021-06-29 VITALS — BP 110/70 | HR 90 | Ht 68.0 in | Wt >= 6400 oz

## 2021-06-29 DIAGNOSIS — D649 Anemia, unspecified: Secondary | ICD-10-CM | POA: Diagnosis not present

## 2021-06-29 DIAGNOSIS — M25561 Pain in right knee: Secondary | ICD-10-CM | POA: Diagnosis not present

## 2021-06-29 DIAGNOSIS — Z85028 Personal history of other malignant neoplasm of stomach: Secondary | ICD-10-CM

## 2021-06-29 DIAGNOSIS — Z1211 Encounter for screening for malignant neoplasm of colon: Secondary | ICD-10-CM

## 2021-06-29 DIAGNOSIS — Z6841 Body Mass Index (BMI) 40.0 and over, adult: Secondary | ICD-10-CM

## 2021-06-29 NOTE — Patient Instructions (Addendum)
If you are age 58 or younger, your body mass index should be between 19-25. Your Body mass index is 62.69 kg/m. If this is out of the aformentioned range listed, please consider follow up with your Primary Care Provider.   The [facility] providers would like to encourage you to use [facility] to communicate with providers for non-urgent requests or questions.  Due to long hold times on the telephone, sending your provider a message by [facility] may be faster and more efficient way to get a response. Please allow 48 business hours for a response.  Please remember that this is for non-urgent requests/questions.   LABS:  Lab work has been ordered for you today. Our lab is located in the basement. Press "B" on the elevator. The lab is located at the first door on the left as you exit the elevator.  HEALTHCARE LAWS AND MY CHART RESULTS: Due to recent changes in healthcare laws, you may see the results of your imaging and laboratory studies on MyChart before your provider has had a chance to review them.   We understand that in some cases there may be results that are confusing or concerning to you. Not all laboratory results come back in the same time frame and the provider may be waiting for multiple results in order to interpret others.  Please give [place] 48 hours in order for your provider to thoroughly review all the results before contacting the office for clarification of your results.   Further endoscopic recommendations to be verified after lab results received.  It was great seeing you today! Thank you for entrusting me with your care and choosing [facility].  Noralyn Pick, CRNP

## 2021-06-29 NOTE — Progress Notes (Signed)
06/29/2021 Craig [name] [id number] 05-Jun-1961   CHIEF COMPLAINT: Schedule colonoscopy  HISTORY OF PRESENT ILLNESS: Craig [name] is a 60 year old male with a past medical history of obesity, stomach cancer s/p chemo 2013, hypertension, hyperlipidemia, DM II, diabetic neuropathy, GERD. Laparoscopy x 1.  He presents to our office today as referred by Dr. Madison Hickman to schedule a screening colonoscopy.  He is accompanied by a home health RN.  He requires home nursing care due to severe bilateral knee arthritis with associated gait instability in the setting of significant obesity.  He denies having any lower abdominal pain.  He is passing a normal formed brown bowel movement 2-3 times daily.  No rectal bleeding or black stools.  No known family history of colorectal cancer.  He denies ever having a screening colonoscopy.  He was started on Trulicity 3 months ago and he has successfully lost 20 pounds.  He is hoping to undergo bilateral knee replacement in the future once he loses more weight.  His last hemoglobin A1c level was 5.9.  He reports a vague history of stomach cancer which was diagnosed and treated when he lived in [place] in 2013.  He stated he received chemotherapy without surgical intervention.  He remains on Pantoprazole 40 mg since his diagnosis of stomach cancer.  He has infrequent palpitations.  No chest pain.  He was evaluated by cardiologist Dr. Gardiner Rhyme and he underwent a 2-day Zio patch 11/08/2020 which showed frequent PVCs.  He is Toprol-XL 25 mg once daily which has controlled his palpitations.  LVEF 55 to 60% per TTE 12/2019.  No history of pulmonary disease.  In review of his epic records, he has mild normocytic anemia since 01/2014.  He denies any knowledge of having anemia.  No history of diabetes associated renal disease.  Laboratory studies 02/19/2020: Hemoglobin 11.3.  Hematocrit 36.1.  MCV 89.  Platelet 248.  Laboratory studies 01/08/2020: BUN 9.  Creatinine  1.04.  Past Medical History:  Diagnosis Date   Anemia 12/15/2014   Anxiety and depression 08/17/2017   Bilateral carpal tunnel syndrome 08/03/2016   Cancer ([place]) abdomen   Controlled type 2 diabetes mellitus without complication, without long-term current use of insulin ([place]) 09/17/2012   Well-controlled.  No history of insulin use.   Dizziness 03/27/2012   GERD (gastroesophageal reflux disease) 03/27/2012   History of gastric cancer 02/14/2012   Followed by Dr. Wynetta Emery in [place].    Hyperlipidemia associated with type 2 diabetes mellitus ([place]) 01/01/2013   Morbid obesity with BMI of 50.0-59.9, adult ([place]) 06/05/2012   Onychomycosis 08/27/2018   Orthostatic hypotension 11/10/2019   Perioral numbness 07/30/2018   Poor dentition 12/02/2018   Primary hypertension 04/30/2012   Right hand paresthesia 03/26/2017   Past Surgical History:  Procedure Laterality Date   ABDOMINAL SURGERY     Social History: He stopped smoking cigarettes 43 years ago. Remote drug use as a teenager.   No alcohol use.   Family History: Mother, father and brother with diabetes and heart disease.  No Known Allergies   Outpatient Encounter Medications as of 06/29/2021  Medication Sig   antiseptic oral rinse (BIOTENE) LIQD 15 mLs by Mouth Rinse route as needed for dry mouth.   aspirin (ASPIRIN LOW DOSE) 81 MG EC tablet TAKE 1 TABLET BY MOUTH EVERY DAY   atorvastatin (LIPITOR) 80 MG tablet Take 1 tablet (80 mg total) by mouth daily.   Blood Glucose Monitoring Suppl (ACCU-CHEK GUIDE) w/Device KIT Inject  1 kit into the skin daily.   diclofenac Sodium (VOLTAREN) 1 % GEL Apply 2 g topically 4 (four) times daily.   Dulaglutide (TRULICITY) 2.70 JJ/0.0XF SOPN Inject 0.75 mg into the skin once a week.   gabapentin (NEURONTIN) 300 MG capsule TAKE 1 CAPSULE BY MOUTH THREE TIMES A DAY   glucose blood (ACCU-CHEK GUIDE) test strip Use as instructed   irbesartan (AVAPRO) 150 MG tablet TAKE 1 TABLET BY MOUTH EVERYDAY AT BEDTIME    Lancets (ACCU-CHEK MULTICLIX) lancets Use as instructed   metFORMIN (GLUCOPHAGE-XR) 500 MG 24 hr tablet TAKE 1 TABLET BY MOUTH TWICE A DAY   metoprolol succinate (TOPROL XL) 50 MG 24 hr tablet Take 1 tablet (50 mg total) by mouth 2 (two) times daily. (Patient taking differently: Take 50 mg by mouth 2 (two) times daily. 1/2 tablet at night if dizziness present)   pantoprazole (PROTONIX) 40 MG tablet TAKE 1 TABLET BY MOUTH EVERY DAY   terbinafine (LAMISIL AT) 1 % cream Apply 1 application topically 2 (two) times daily. (Patient taking differently: Apply 1 application topically daily.)   [DISCONTINUED] Multiple Vitamins-Minerals (VITAMINS TO GO MEN) MISC Take 1 each by mouth in the morning, at noon, and at bedtime. Take 1 GOLI supplement three time daily (Patient not taking: Reported on 04/07/2021)   No facility-administered encounter medications on file as of 06/29/2021.   REVIEW OF SYSTEMS:  Gen: Denies fever, sweats or chills. No weight loss.  CV: Denies chest pain, palpitations or edema. Resp: Denies cough, shortness of breath of hemoptysis.  GI: See HPI. GU : Denies urinary burning, blood in urine, increased urinary frequency or incontinence. MS: Denies joint pain, muscles aches or weakness. Derm: Denies rash, itchiness, skin lesions or unhealing ulcers. Psych: Denies depression, anxiety or memory loss. Heme: Denies bruising, bleeding. Neuro:  Denies headaches, dizziness or paresthesias. Endo:  + DM II.   PHYSICAL EXAM: BP 110/70   Pulse 90   Ht '5\' 8"'  (1.727 m)   Wt (!) 412 lb 5 oz (187 kg)   BMI 62.69 kg/m  General: 60 year old morbidly obese male in no acute distress. Head: Normocephalic and atraumatic. Eyes:  Sclerae non-icteric, conjunctive pink. Ears: Normal auditory acuity. Mouth: Poor dentition.  No ulcers or lesions.  Neck: Supple, no lymphadenopathy or thyromegaly.  Lungs: Clear bilaterally to auscultation without wheezes, crackles or rhonchi. Heart: Regular rate and  rhythm. No murmur, rub or gallop appreciated.  Abdomen: Soft, nontender, non distended. No masses. No hepatosplenomegaly. Normoactive bowel sounds x 4 quadrants.  Rectal: Deferred. Musculoskeletal: Symmetrical with no gross deformities. Skin: Warm and dry. No rash or lesions on visible extremities. Extremities: No edema. Neurological: Alert oriented x 4, no focal deficits.  Psychological:  Alert and cooperative. Normal mood and affect.  ASSESSMENT AND PLAN:  45) 60 year old male presents to discuss scheduling a screening colonoscopy.  He is unsure if he wishes to proceed with a colonoscopy but will consider a Cologuard test. -I reviewed the colonoscopy benefits and risks in full detail including risk with sedation, risk of bleeding, perforation and infection.  I explained to the patient to due to his BMI of 62% his colonoscopy would be scheduled at [facility]  2) Mild normocytic anemia per labs 2015 - 12/2019 -CBC, iron, iron saturation, TIBC and ferritin level -If the above lab results identify iron deficiency anemia I explained an EGD and colonoscopy would be recommended  3) History of stomach cancer in 2013 treated with chemotherapy without surgical intervention 2013 -See  plan in #2  4)          CC:  Autry-Lott, Naaman Plummer, DO

## 2021-06-30 DIAGNOSIS — M25561 Pain in right knee: Secondary | ICD-10-CM | POA: Diagnosis not present

## 2021-06-30 NOTE — Progress Notes (Signed)
Agree with the assessment and plan as outlined by Carl Best, NP.  Cologuard may be a preferable option if no overt GI bleeding and no significant iron deficiency   Maziyah Vessel E. Candis Schatz, MD [facility]

## 2021-07-04 ENCOUNTER — Other Ambulatory Visit (INDEPENDENT_AMBULATORY_CARE_PROVIDER_SITE_OTHER): Payer: Medicaid Other

## 2021-07-04 DIAGNOSIS — Z85028 Personal history of other malignant neoplasm of stomach: Secondary | ICD-10-CM | POA: Diagnosis not present

## 2021-07-04 DIAGNOSIS — D649 Anemia, unspecified: Secondary | ICD-10-CM | POA: Diagnosis not present

## 2021-07-04 DIAGNOSIS — Z1211 Encounter for screening for malignant neoplasm of colon: Secondary | ICD-10-CM | POA: Diagnosis not present

## 2021-07-04 DIAGNOSIS — M25561 Pain in right knee: Secondary | ICD-10-CM | POA: Diagnosis not present

## 2021-07-04 DIAGNOSIS — Z6841 Body Mass Index (BMI) 40.0 and over, adult: Secondary | ICD-10-CM | POA: Diagnosis not present

## 2021-07-04 LAB — IBC + FERRITIN
Ferritin: 72.2 ng/mL (ref 22.0–322.0)
Iron: 60 ug/dL (ref 42–165)
Saturation Ratios: 20.4 % (ref 20.0–50.0)
TIBC: 294 ug/dL (ref 250.0–450.0)
Transferrin: 210 mg/dL — ABNORMAL LOW (ref 212.0–360.0)

## 2021-07-04 LAB — COMPREHENSIVE METABOLIC PANEL
ALT: 21 U/L (ref 0–53)
AST: 24 U/L (ref 0–37)
Albumin: 3.8 g/dL (ref 3.5–5.2)
Alkaline Phosphatase: 106 U/L (ref 39–117)
BUN: 9 mg/dL (ref 6–23)
CO2: 29 mEq/L (ref 19–32)
Calcium: 9.3 mg/dL (ref 8.4–10.5)
Chloride: 100 mEq/L (ref 96–112)
Creatinine, Ser: 1.07 mg/dL (ref 0.40–1.50)
GFR: 76.46 mL/min (ref >60.00)
Glucose, Bld: 86 mg/dL (ref 70–99)
Potassium: 3.6 mEq/L (ref 3.5–5.1)
Sodium: 138 mEq/L (ref 135–145)
Total Bilirubin: 0.4 mg/dL (ref 0.2–1.2)
Total Protein: 8.7 g/dL — ABNORMAL HIGH (ref 6.0–8.3)

## 2021-07-04 LAB — CBC
HCT: 38.5 % — ABNORMAL LOW (ref 39.0–52.0)
Hemoglobin: 11.9 g/dL — ABNORMAL LOW (ref 13.0–17.0)
MCHC: 30.9 g/dL (ref 30.0–36.0)
MCV: 89 fl (ref 78.0–100.0)
Platelets: 305 10*3/uL (ref 150.0–400.0)
RBC: 4.33 Mil/uL (ref 4.22–5.81)
RDW: 16.5 % — ABNORMAL HIGH (ref 11.5–15.5)
WBC: 9.4 10*3/uL (ref 4.0–10.5)

## 2021-07-05 DIAGNOSIS — M25561 Pain in right knee: Secondary | ICD-10-CM | POA: Diagnosis not present

## 2021-07-06 ENCOUNTER — Other Ambulatory Visit: Payer: Self-pay

## 2021-07-06 DIAGNOSIS — Z6841 Body Mass Index (BMI) 40.0 and over, adult: Secondary | ICD-10-CM

## 2021-07-06 DIAGNOSIS — M25561 Pain in right knee: Secondary | ICD-10-CM | POA: Diagnosis not present

## 2021-07-06 DIAGNOSIS — Z1211 Encounter for screening for malignant neoplasm of colon: Secondary | ICD-10-CM

## 2021-07-07 DIAGNOSIS — M25561 Pain in right knee: Secondary | ICD-10-CM | POA: Diagnosis not present

## 2021-07-11 DIAGNOSIS — M25561 Pain in right knee: Secondary | ICD-10-CM | POA: Diagnosis not present

## 2021-07-12 DIAGNOSIS — M25561 Pain in right knee: Secondary | ICD-10-CM | POA: Diagnosis not present

## 2021-07-13 ENCOUNTER — Encounter: Payer: Medicaid Other | Admitting: Gastroenterology

## 2021-07-13 DIAGNOSIS — M25561 Pain in right knee: Secondary | ICD-10-CM | POA: Diagnosis not present

## 2021-07-14 DIAGNOSIS — M25561 Pain in right knee: Secondary | ICD-10-CM | POA: Diagnosis not present

## 2021-07-18 DIAGNOSIS — M25561 Pain in right knee: Secondary | ICD-10-CM | POA: Diagnosis not present

## 2021-07-19 DIAGNOSIS — M25561 Pain in right knee: Secondary | ICD-10-CM | POA: Diagnosis not present

## 2021-07-19 NOTE — Progress Notes (Deleted)
Cardiology Clinic Note   Patient Name: Craig [name] Date of Encounter: 07/19/2021  Primary Care Provider:  Gerlene Fee, DO Primary Cardiologist:  Donato Heinz, MD  Patient Profile    Craig [name] 60 year old male presents the clinic today for follow-up evaluation of his primary hypertension and hyperlipidemia.  Past Medical History    Past Medical History:  Diagnosis Date   Anemia 12/15/2014   Anxiety and depression 08/17/2017   Bilateral carpal tunnel syndrome 08/03/2016   Cancer ([place]) abdomen   Controlled type 2 diabetes mellitus without complication, without long-term current use of insulin ([place]) 09/17/2012   Well-controlled.  No history of insulin use.   Dizziness 03/27/2012   GERD (gastroesophageal reflux disease) 03/27/2012   History of gastric cancer 02/14/2012   Followed by Dr. Wynetta Emery in [place].    Hyperlipidemia associated with type 2 diabetes mellitus ([place]) 01/01/2013   Morbid obesity with BMI of 50.0-59.9, adult ([place]) 06/05/2012   Onychomycosis 08/27/2018   Orthostatic hypotension 11/10/2019   Perioral numbness 07/30/2018   Poor dentition 12/02/2018   Primary hypertension 04/30/2012   Right hand paresthesia 03/26/2017   Past Surgical History:  Procedure Laterality Date   ABDOMINAL SURGERY      Allergies  No Known Allergies  History of Present Illness    Craig [name] has a PMH of primary hypertension, gastric cancer, GERD, type 2 diabetes, hyperlipidemia, morbid obesity, dizziness, bilateral knee pain, PVCs, anxiety and depression.  He was seen and evaluated by Dr.McIntyre for orthostatic hypotension.  He was seen on 01/07/2020.  His Victoza, gabapentin, valsartan, and carvedilol were discontinued.  He reported having dizziness 3-4 times per week.  The episodes would occur multiple times per day.  He denied syncopal episodes.  He did report occasional palpitations where he felt like his heart was racing.  He denied positional relationship.  His  symptoms improved since stopping the medications.  He was seen by Dr.Schumann 01/25/2021.  He reported that prior to the COVID pandemic he was active mowing 4 times per week and swimming team 3 times per week.  He gained around 8 pounds since the pandemic.  He quit smoking several years ago.  His father had MI at age 41.  His echocardiogram 01/05/2020 showed an LVEF of 55-60%, normal RV function, no significant valvular disease.  He wore a cardiac event monitor for 4 days on 7/21 which showed frequent PVCs he was started on metoprolol XL and was titrated to 50 mg daily.  A follow-up cardiac event monitor 11/08/2020 showed frequent PVCs.  His metoprolol succinate was increased to 50 mg twice daily.  During his 01/25/2021 cardiology visit he was doing better.  He brought in a blood pressure log which showed systolic blood pressures in the 90s-130s over 70s-80s.  He denied chest pain and dyspnea with exertion.  His metoprolol succinate was reduced to 25 mg daily due to lightheadedness.  He denied lightheadedness since the reduced medication.  He denied syncope.  He did note occasional lower extremity swelling.  He has been doing physical therapy and his mobility has improved.  He reported walking daily for about 10 minutes.  He presents to the clinic today for follow-up evaluation states***  *** denies chest pain, shortness of breath, lower extremity edema, fatigue, palpitations, melena, hematuria, hemoptysis, diaphoresis, weakness, presyncope, syncope, orthopnea, and PND.   Home Medications    Prior to Admission medications   Medication Sig Start Date End Date Taking? Authorizing Provider  antiseptic oral rinse (  BIOTENE) LIQD 15 mLs by Mouth Rinse route as needed for dry mouth.    [provider]  aspirin (ASPIRIN LOW DOSE) 81 MG EC tablet TAKE 1 TABLET BY MOUTH EVERY DAY 04/07/21   Zenia Resides, MD  atorvastatin (LIPITOR) 80 MG tablet Take 1 tablet (80 mg total) by mouth daily. 04/07/21    Zenia Resides, MD  Blood Glucose Monitoring Suppl (ACCU-CHEK GUIDE) w/Device KIT Inject 1 kit into the skin daily. 07/28/20   Autry-Lott, Naaman Plummer, DO  diclofenac Sodium (VOLTAREN) 1 % GEL Apply 2 g topically 4 (four) times daily. 04/07/21   Zenia Resides, MD  Dulaglutide (TRULICITY) 3.61 WE/3.1VQ SOPN Inject 0.75 mg into the skin once a week. 06/27/21   Autry-Lott, Naaman Plummer, DO  gabapentin (NEURONTIN) 300 MG capsule TAKE 1 CAPSULE BY MOUTH THREE TIMES A DAY 06/27/21   Autry-Lott, Naaman Plummer, DO  glucose blood (ACCU-CHEK GUIDE) test strip Use as instructed 06/15/20   Autry-Lott, Naaman Plummer, DO  irbesartan (AVAPRO) 150 MG tablet TAKE 1 TABLET BY MOUTH EVERYDAY AT BEDTIME 03/21/21   Autry-Lott, Naaman Plummer, DO  Lancets (ACCU-CHEK MULTICLIX) lancets Use as instructed 11/30/20   Lyndee Hensen, DO  metFORMIN (GLUCOPHAGE-XR) 500 MG 24 hr tablet TAKE 1 TABLET BY MOUTH TWICE A DAY 04/04/21   Autry-Lott, Naaman Plummer, DO  metoprolol succinate (TOPROL XL) 50 MG 24 hr tablet Take 1 tablet (50 mg total) by mouth 2 (two) times daily. Patient taking differently: Take 50 mg by mouth 2 (two) times daily. 1/2 tablet at night if dizziness present 11/17/20   Donato Heinz, MD  pantoprazole (PROTONIX) 40 MG tablet TAKE 1 TABLET BY MOUTH EVERY DAY 07/15/20   Autry-Lott, Naaman Plummer, DO  terbinafine (LAMISIL AT) 1 % cream Apply 1 application topically 2 (two) times daily. Patient taking differently: Apply 1 application topically daily. 06/12/19   Autry-Lott, Naaman Plummer, DO    Family History    Family History  Problem Relation Age of Onset   Diabetes type II Mother    He indicated that the status of his mother is unknown.  Social History    Social History   Socioeconomic History   Marital status: Single    Spouse name: Not on file   Number of children: Not on file   Years of education: Not on file   Highest education level: Not on file  Occupational History   Not on file  Tobacco Use   Smoking status: Former    Types:  Cigarettes    Start date: 08/14/1977    Quit date: 08/14/1996    Years since quitting: 24.9   Smokeless tobacco: Never  Vaping Use   Vaping Use: Never used  Substance and Sexual Activity   Alcohol use: No   Drug use: Yes    Types: Cocaine, Marijuana, Heroin    Comment: Previous Hx of substance abuse   Sexual activity: Yes  Other Topics Concern   Not on file  Social History Narrative   Not on file   Social Determinants of Health   Financial Resource Strain: Not on file  Food Insecurity: Not on file  Transportation Needs: Not on file  Physical Activity: Not on file  Stress: Not on file  Social Connections: Not on file  Intimate Partner Violence: Not on file     Review of Systems    General:  No chills, fever, night sweats or weight changes.  Cardiovascular:  No chest pain, dyspnea on exertion, edema, orthopnea, palpitations, paroxysmal nocturnal dyspnea. Dermatological: No rash,  lesions/masses Respiratory: No cough, dyspnea Urologic: No hematuria, dysuria Abdominal:   No nausea, vomiting, diarrhea, bright red blood per rectum, melena, or hematemesis Neurologic:  No visual changes, wkns, changes in mental status. All other systems reviewed and are otherwise negative except as noted above.  Physical Exam    VS:  There were no vitals taken for this visit. , BMI There is no height or weight on file to calculate BMI. GEN: Well nourished, well developed, in no acute distress. HEENT: normal. Neck: Supple, no JVD, carotid bruits, or masses. Cardiac: RRR, no murmurs, rubs, or gallops. No clubbing, cyanosis, edema.  Radials/DP/PT 2+ and equal bilaterally.  Respiratory:  Respirations regular and unlabored, clear to auscultation bilaterally. GI: Soft, nontender, nondistended, BS + x 4. MS: no deformity or atrophy. Skin: warm and dry, no rash. Neuro:  Strength and sensation are intact. Psych: Normal affect.  Accessory Clinical Findings    Recent Labs: 07/04/2021: ALT 21; BUN 9;  Creatinine, Ser 1.07; Hemoglobin 11.9; Platelets 305.0; Potassium 3.6; Sodium 138   Recent Lipid Panel    Component Value Date/Time   CHOL 142 02/01/2021 1438   TRIG 102 02/01/2021 1438   HDL 33 (L) 02/01/2021 1438   CHOLHDL 4.3 02/01/2021 1438   CHOLHDL 4.3 06/16/2015 0921   VLDL 13 06/16/2015 0921   LDLCALC 90 02/01/2021 1438   LDLDIRECT 90 07/13/2017 1606   LDLDIRECT 88 02/04/2016 1115    ECG personally reviewed by me today- *** - No acute changes  Echocardiogram 01/05/2020 IMPRESSIONS     1. Left ventricular ejection fraction, by estimation, is 55 to 60%. The  left ventricle has normal function. The left ventricle has no regional  wall motion abnormalities. Left ventricular diastolic parameters were  normal.   2. Right ventricular systolic function is normal. The right ventricular  size is normal. Tricuspid regurgitation signal is inadequate for assessing  PA pressure.   3. The mitral valve is grossly normal. No evidence of mitral valve  regurgitation. No evidence of mitral stenosis.   4. The aortic valve is tricuspid. Aortic valve regurgitation is not  visualized. Mild to moderate aortic valve sclerosis/calcification is  present, without any evidence of aortic stenosis.   5. The inferior vena cava is normal in size with greater than 50%  respiratory variability, suggesting right atrial pressure of 3 mmHg.  Assessment & Plan   1.  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  He was started on metoprolol succinate which was titrated up to 50 mg.  Repeat cardiac event monitor 11/08/2020 showed decreased PVCs.  His metoprolol succinate was increased to 50 mg twice daily.  He did not tolerate the medication well due to lightheadedness.  It was decreased to 50 mg in the morning and 25 mg at night.  He continues to tolerate medication well and notes reduced palpitations. Continue metoprolol Heart healthy low-sodium diet-salty 6 given Increase  physical activity as tolerated Avoid triggers caffeine, chocolate, EtOH, dehydration, etc.  Lightheadedness-resolved.  Had previous episodes of orthostatic hypotension with blood pressure medications.  Details above. Maintain p.o. hydration Continue weight loss Increase physical activity as tolerated Continue to monitor  Essential hypertension-BP today***.  Well-controlled at home. Continue metoprolol, irbesartan Heart healthy low-sodium diet-salty 6 given Increase physical activity as tolerated Maintain blood pressure log  DOE-has noted improvement in his breathing with increased physical fitness.  Previously offered nuclear stress test.  Patient declined. Heart healthy low-sodium diet-salty 6 given Increase physical activity  as tolerated  Morbid obesity-weight today***.  Reports increased physical activity and more strict diet. Continue weight loss Heart healthy low-sodium diet-salty 6 given Increase physical activity as tolerated  Follow-up with Dr.Schumann or me in 4-6 months.  Jossie Ng. Cleaver NP-C    07/19/2021, 7:30 AM [place] [address] Office [phone] Fax [phone]  Notice: This dictation was prepared with Dragon dictation along with smaller phrase technology. Any transcriptional errors that result from this process are unintentional and may not be corrected upon review.  I spent***minutes examining this patient, reviewing medications, and using patient centered shared decision making involving her cardiac care.  Prior to her visit I spent greater than 20 minutes reviewing her past medical history,  medications, and prior cardiac tests.

## 2021-07-20 ENCOUNTER — Ambulatory Visit: Payer: Medicaid Other | Admitting: General Practice

## 2021-07-20 ENCOUNTER — Telehealth: Payer: Self-pay | Admitting: Cardiology

## 2021-07-20 DIAGNOSIS — M25561 Pain in right knee: Secondary | ICD-10-CM | POA: Diagnosis not present

## 2021-07-20 NOTE — Telephone Encounter (Signed)
   Pt said he found out he is Anemic and if Dr. Gardiner Rhyme has any recommendations

## 2021-07-20 NOTE — Telephone Encounter (Signed)
Attempted to Craig patient  "Craig [name]"

## 2021-07-21 DIAGNOSIS — M25561 Pain in right knee: Secondary | ICD-10-CM | POA: Diagnosis not present

## 2021-07-25 ENCOUNTER — Other Ambulatory Visit: Payer: Self-pay | Admitting: Family Medicine

## 2021-07-25 DIAGNOSIS — E119 Type 2 diabetes mellitus without complications: Secondary | ICD-10-CM

## 2021-07-25 DIAGNOSIS — M25561 Pain in right knee: Secondary | ICD-10-CM | POA: Diagnosis not present

## 2021-07-26 DIAGNOSIS — M25561 Pain in right knee: Secondary | ICD-10-CM | POA: Diagnosis not present

## 2021-07-26 NOTE — Telephone Encounter (Signed)
Called patient, he states he recently had blood work completed (in [place])  And they found he was anemic, he was calling to see if Dr.Schumann recommended any vitamins- I did advise that he was being treated by his PCP already in regards to this, and they should follow this. However, I would notify Dr.Schumann of recent blood work and would call if any other recommendations given.   Thanks!

## 2021-07-27 DIAGNOSIS — M25561 Pain in right knee: Secondary | ICD-10-CM | POA: Diagnosis not present

## 2021-07-28 DIAGNOSIS — M25561 Pain in right knee: Secondary | ICD-10-CM | POA: Diagnosis not present

## 2021-07-31 NOTE — Telephone Encounter (Signed)
Agree, would defer to PCP for management

## 2021-08-01 DIAGNOSIS — M25561 Pain in right knee: Secondary | ICD-10-CM | POA: Diagnosis not present

## 2021-08-02 DIAGNOSIS — M25561 Pain in right knee: Secondary | ICD-10-CM | POA: Diagnosis not present

## 2021-08-03 DIAGNOSIS — M25561 Pain in right knee: Secondary | ICD-10-CM | POA: Diagnosis not present

## 2021-08-03 NOTE — Telephone Encounter (Signed)
Spoke with pt, aware of dr schumann's recommendations.

## 2021-08-04 DIAGNOSIS — M25561 Pain in right knee: Secondary | ICD-10-CM | POA: Diagnosis not present

## 2021-08-08 DIAGNOSIS — M25561 Pain in right knee: Secondary | ICD-10-CM | POA: Diagnosis not present

## 2021-08-09 DIAGNOSIS — M25561 Pain in right knee: Secondary | ICD-10-CM | POA: Diagnosis not present

## 2021-08-10 DIAGNOSIS — M25561 Pain in right knee: Secondary | ICD-10-CM | POA: Diagnosis not present

## 2021-08-11 DIAGNOSIS — M25561 Pain in right knee: Secondary | ICD-10-CM | POA: Diagnosis not present

## 2021-08-15 DIAGNOSIS — M25561 Pain in right knee: Secondary | ICD-10-CM | POA: Diagnosis not present

## 2021-08-16 DIAGNOSIS — M25561 Pain in right knee: Secondary | ICD-10-CM | POA: Diagnosis not present

## 2021-08-17 DIAGNOSIS — M25561 Pain in right knee: Secondary | ICD-10-CM | POA: Diagnosis not present

## 2021-08-18 DIAGNOSIS — M25561 Pain in right knee: Secondary | ICD-10-CM | POA: Diagnosis not present

## 2021-08-22 DIAGNOSIS — M25561 Pain in right knee: Secondary | ICD-10-CM | POA: Diagnosis not present

## 2021-08-23 DIAGNOSIS — M25561 Pain in right knee: Secondary | ICD-10-CM | POA: Diagnosis not present

## 2021-08-24 DIAGNOSIS — M25561 Pain in right knee: Secondary | ICD-10-CM | POA: Diagnosis not present

## 2021-08-25 DIAGNOSIS — M25561 Pain in right knee: Secondary | ICD-10-CM | POA: Diagnosis not present

## 2021-08-29 DIAGNOSIS — M25561 Pain in right knee: Secondary | ICD-10-CM | POA: Diagnosis not present

## 2021-08-30 ENCOUNTER — Other Ambulatory Visit: Payer: Self-pay

## 2021-08-30 ENCOUNTER — Telehealth: Payer: Self-pay | Admitting: Nurse Practitioner

## 2021-08-30 DIAGNOSIS — Z1211 Encounter for screening for malignant neoplasm of colon: Secondary | ICD-10-CM

## 2021-08-30 DIAGNOSIS — M25561 Pain in right knee: Secondary | ICD-10-CM | POA: Diagnosis not present

## 2021-08-30 NOTE — Telephone Encounter (Signed)
The order was put in in November. Co-worker check on the [employer] site. His order was not listed. Re-ordered the Colog[facility]ard for this patient.  Spoke with the patient. Advised him of this. Gave him the [facility] telephone number. Instructed him to call them in a few days. Confirm his order. Let me know if there are further issues.

## 2021-08-30 NOTE — Telephone Encounter (Signed)
Patient called to follow up.  He thought he was going to be sent a Cologuard test or some kind of test to do at home to check for his low iron.  Please call patient and advise.  Thank you.

## 2021-08-31 DIAGNOSIS — M25561 Pain in right knee: Secondary | ICD-10-CM | POA: Diagnosis not present

## 2021-09-01 DIAGNOSIS — M25561 Pain in right knee: Secondary | ICD-10-CM | POA: Diagnosis not present

## 2021-09-05 ENCOUNTER — Telehealth: Payer: Self-pay | Admitting: Gastroenterology

## 2021-09-05 DIAGNOSIS — M25561 Pain in right knee: Secondary | ICD-10-CM | POA: Diagnosis not present

## 2021-09-05 NOTE — Telephone Encounter (Signed)
Patient said [facility] did not have correct address information and [facility] has asked for [place] to call in his order instead of doing it online so the order can go out more expeditiously.  The phone number to [facility] to call in the order is [phone].  Thank you.

## 2021-09-05 NOTE — Telephone Encounter (Signed)
Spoke with the patient. Confirmed the mailing address. Called [facility]. They do not have an order. The ordering provider Kennth Vanbenschoten is registered in the system.The primary GI is not registered with [facility]. Cologuard has been ordered x2 using Epic. Order form completed and faxed for this patient.

## 2021-09-06 DIAGNOSIS — M25561 Pain in right knee: Secondary | ICD-10-CM | POA: Diagnosis not present

## 2021-09-07 DIAGNOSIS — M25561 Pain in right knee: Secondary | ICD-10-CM | POA: Diagnosis not present

## 2021-09-08 DIAGNOSIS — M25561 Pain in right knee: Secondary | ICD-10-CM | POA: Diagnosis not present

## 2021-09-12 DIAGNOSIS — M25561 Pain in right knee: Secondary | ICD-10-CM | POA: Diagnosis not present

## 2021-09-13 DIAGNOSIS — M25561 Pain in right knee: Secondary | ICD-10-CM | POA: Diagnosis not present

## 2021-09-14 ENCOUNTER — Ambulatory Visit: Payer: Medicaid Other | Admitting: Family Medicine

## 2021-09-14 ENCOUNTER — Other Ambulatory Visit: Payer: Self-pay | Admitting: Family Medicine

## 2021-09-14 DIAGNOSIS — E1140 Type 2 diabetes mellitus with diabetic neuropathy, unspecified: Secondary | ICD-10-CM

## 2021-09-14 DIAGNOSIS — Z1212 Encounter for screening for malignant neoplasm of rectum: Secondary | ICD-10-CM | POA: Diagnosis not present

## 2021-09-14 DIAGNOSIS — M25561 Pain in right knee: Secondary | ICD-10-CM | POA: Diagnosis not present

## 2021-09-14 DIAGNOSIS — Z1211 Encounter for screening for malignant neoplasm of colon: Secondary | ICD-10-CM | POA: Diagnosis not present

## 2021-09-14 DIAGNOSIS — E119 Type 2 diabetes mellitus without complications: Secondary | ICD-10-CM

## 2021-09-15 DIAGNOSIS — M25561 Pain in right knee: Secondary | ICD-10-CM | POA: Diagnosis not present

## 2021-09-17 NOTE — Progress Notes (Signed)
Cardiology Clinic Note   Patient Name: Craig [name] Date of Encounter: 09/19/2021  Primary Care Provider:  Gerlene Fee, DO Primary Cardiologist:  Donato Heinz, MD  Patient Profile    Craig [name] 61 year old male presents the clinic today for follow-up evaluation of his primary hypertension and hyperlipidemia.  Past Medical History    Past Medical History:  Diagnosis Date   Anemia 12/15/2014   Anxiety and depression 08/17/2017   Bilateral carpal tunnel syndrome 08/03/2016   Cancer ([place]) abdomen   Controlled type 2 diabetes mellitus without complication, without long-term current use of insulin ([place]) 09/17/2012   Well-controlled.  No history of insulin use.   Dizziness 03/27/2012   GERD (gastroesophageal reflux disease) 03/27/2012   History of gastric cancer 02/14/2012   Followed by Dr. Wynetta Emery in [place].    Hyperlipidemia associated with type 2 diabetes mellitus ([place]) 01/01/2013   Morbid obesity with BMI of 50.0-59.9, adult ([place]) 06/05/2012   Onychomycosis 08/27/2018   Orthostatic hypotension 11/10/2019   Perioral numbness 07/30/2018   Poor dentition 12/02/2018   Primary hypertension 04/30/2012   Right hand paresthesia 03/26/2017   Past Surgical History:  Procedure Laterality Date   ABDOMINAL SURGERY      Allergies  No Known Allergies  History of Present Illness    Craig [name] has a PMH of primary hypertension, gastric cancer, GERD, type 2 diabetes, hyperlipidemia, morbid obesity, dizziness, bilateral knee pain, PVCs, anxiety and depression.  He was seen and evaluated by Dr.McIntyre for orthostatic hypotension.  He was seen on 01/07/2020.  His Victoza, gabapentin, valsartan, and carvedilol were discontinued.  He reported having dizziness 3-4 times per week.  The episodes would occur multiple times per day.  He denied syncopal episodes.  He did report occasional palpitations where he felt like his heart was racing.  He denied positional relationship.  His  symptoms improved since stopping the medications.  He was seen by Dr.Schumann 01/25/2021.  He reported that prior to the COVID pandemic he was active mowing 4 times per week and swimming team 3 times per week.  He gained around 8 pounds since the pandemic.  He quit smoking several years ago.  His father had MI at age 52.  His echocardiogram 01/05/2020 showed an LVEF of 55-60%, normal RV function, no significant valvular disease.  He wore a cardiac event monitor for 4 days on 7/21 which showed frequent PVCs he was started on metoprolol XL and was titrated to 50 mg daily.  A follow-up cardiac event monitor 11/08/2020 showed frequent PVCs.  His metoprolol succinate was increased to 50 mg twice daily.  During his 01/25/2021 cardiology visit he was doing better.  He brought in a blood pressure log which showed systolic blood pressures in the 90s-130s over 70s-80s.  He denied chest pain and dyspnea with exertion.  His metoprolol succinate was reduced to 25 mg daily due to lightheadedness.  He denied lightheadedness since the reduced medication.  He denied syncope.  He did note occasional lower extremity swelling.  He has been doing physical therapy and his mobility has improved.  He reported walking daily for about 10 minutes.  He presents to the clinic today for follow-up evaluation states he feels fairly well.  He continues to notice occasional episodes of dizziness when he is exercising or standing for prolonged periods at times.  He does not notice any dizziness when he is stationary or sedentary.  He is still planning to undergo knee replacement surgery.  He  has been told by orthopedics that he needs to lose around 130 pounds.  His blood pressure is well controlled today currently 126/80 range.  He has a home health aide that helps him with daily care 3-4 days/week.  He has been monitoring his diet and staying away from high sodium foods.  He is taking Trulicity.  We reviewed his previous echocardiogram and PVCs.   He continues to have occasional PVCs.  His EKG today shows sinus rhythm with frequent premature ventricular complexes 84 bpm.  We will continue his current medication regimen, given salty 6 diet sheet, have him increase physical activity as tolerated, and plan follow-up for 6 months.  Today he denies chest pain, shortness of breath, lower extremity edema, fatigue, palpitations, melena, hematuria, hemoptysis, diaphoresis, weakness, presyncope, syncope, orthopnea, and PND.  Home Medications    Prior to Admission medications   Medication Sig Start Date End Date Taking? Authorizing Provider  ACCU-CHEK GUIDE test strip USE AS INSTRUCTED 09/14/21   Autry-Lott, Naaman Plummer, DO  antiseptic oral rinse (BIOTENE) LIQD 15 mLs by Mouth Rinse route as needed for dry mouth.    [provider]  aspirin (ASPIRIN LOW DOSE) 81 MG EC tablet TAKE 1 TABLET BY MOUTH EVERY DAY 04/07/21   Zenia Resides, MD  atorvastatin (LIPITOR) 80 MG tablet Take 1 tablet (80 mg total) by mouth daily. 04/07/21   Zenia Resides, MD  Blood Glucose Monitoring Suppl (ACCU-CHEK GUIDE) w/Device KIT Inject 1 kit into the skin daily. 07/28/20   Autry-Lott, Naaman Plummer, DO  diclofenac Sodium (VOLTAREN) 1 % GEL Apply 2 g topically 4 (four) times daily. 04/07/21   Zenia Resides, MD  Dulaglutide (TRULICITY) 9.50 DT/2.6ZT SOPN Inject 0.75 mg into the skin once a week. 06/27/21   Autry-Lott, Naaman Plummer, DO  gabapentin (NEURONTIN) 300 MG capsule TAKE 1 CAPSULE BY MOUTH THREE TIMES A DAY 09/14/21   Autry-Lott, Naaman Plummer, DO  irbesartan (AVAPRO) 150 MG tablet TAKE 1 TABLET BY MOUTH EVERYDAY AT BEDTIME 03/21/21   Autry-Lott, Naaman Plummer, DO  Lancets (ACCU-CHEK MULTICLIX) lancets Use as instructed 11/30/20   Lyndee Hensen, DO  metFORMIN (GLUCOPHAGE-XR) 500 MG 24 hr tablet TAKE 1 TABLET BY MOUTH TWICE A DAY 07/25/21   Autry-Lott, Naaman Plummer, DO  metoprolol succinate (TOPROL XL) 50 MG 24 hr tablet Take 1 tablet (50 mg total) by mouth 2 (two) times daily. Patient taking  differently: Take 50 mg by mouth 2 (two) times daily. 1/2 tablet at night if dizziness present 11/17/20   Donato Heinz, MD  pantoprazole (PROTONIX) 40 MG tablet TAKE 1 TABLET BY MOUTH EVERY DAY 07/25/21   Autry-Lott, Naaman Plummer, DO  terbinafine (LAMISIL AT) 1 % cream Apply 1 application topically 2 (two) times daily. Patient taking differently: Apply 1 application topically daily. 06/12/19   Autry-Lott, Naaman Plummer, DO    Family History    Family History  Problem Relation Age of Onset   Diabetes type II Mother    He indicated that the status of his mother is unknown.  Social History    Social History   Socioeconomic History   Marital status: Single    Spouse name: Not on file   Number of children: Not on file   Years of education: Not on file   Highest education level: Not on file  Occupational History   Not on file  Tobacco Use   Smoking status: Former    Types: Cigarettes    Start date: 08/14/1977    Quit date: 08/14/1996  Years since quitting: 25.1   Smokeless tobacco: Never  Vaping Use   Vaping Use: Never used  Substance and Sexual Activity   Alcohol use: No   Drug use: Yes    Types: Cocaine, Marijuana, Heroin    Comment: Previous Hx of substance abuse   Sexual activity: Yes  Other Topics Concern   Not on file  Social History Narrative   Not on file   Social Determinants of Health   Financial Resource Strain: Not on file  Food Insecurity: Not on file  Transportation Needs: Not on file  Physical Activity: Not on file  Stress: Not on file  Social Connections: Not on file  Intimate Partner Violence: Not on file     Review of Systems    General:  No chills, fever, night sweats or weight changes.  Cardiovascular:  No chest pain, dyspnea on exertion, edema, orthopnea, palpitations, paroxysmal nocturnal dyspnea. Dermatological: No rash, lesions/masses Respiratory: No cough, dyspnea Urologic: No hematuria, dysuria Abdominal:   No nausea, vomiting, diarrhea,  bright red blood per rectum, melena, or hematemesis Neurologic:  No visual changes, wkns, changes in mental status. All other systems reviewed and are otherwise negative except as noted above.  Physical Exam    VS:  BP 126/88    Pulse 84    Ht '5\' 9"'  (1.753 m)    Wt (!) 420 lb 3.2 oz (190.6 kg)    SpO2 98%    BMI 62.05 kg/m  , BMI Body mass index is 62.05 kg/m. GEN: Well nourished, well developed, in no acute distress. HEENT: normal. Neck: Supple, no JVD, carotid bruits, or masses. Cardiac: RRR, no murmurs, rubs, or gallops. No clubbing, cyanosis, edema.  Radials/DP/PT 2+ and equal bilaterally.  Respiratory:  Respirations regular and unlabored, clear to auscultation bilaterally. GI: Soft, nontender, nondistended, BS + x 4. MS: no deformity or atrophy. Skin: warm and dry, no rash. Neuro:  Strength and sensation are intact. Psych: Normal affect.  Accessory Clinical Findings    Recent Labs: 07/04/2021: ALT 21; BUN 9; Creatinine, Ser 1.07; Hemoglobin 11.9; Platelets 305.0; Potassium 3.6; Sodium 138   Recent Lipid Panel    Component Value Date/Time   CHOL 142 02/01/2021 1438   TRIG 102 02/01/2021 1438   HDL 33 (L) 02/01/2021 1438   CHOLHDL 4.3 02/01/2021 1438   CHOLHDL 4.3 06/16/2015 0921   VLDL 13 06/16/2015 0921   LDLCALC 90 02/01/2021 1438   LDLDIRECT 90 07/13/2017 1606   LDLDIRECT 88 02/04/2016 1115    ECG personally reviewed by me today-sinus rhythm with frequent premature ventricular complexes left atrial enlargement 84 bpm  Echocardiogram 01/05/2020 IMPRESSIONS     1. Left ventricular ejection fraction, by estimation, is 55 to 60%. The  left ventricle has normal function. The left ventricle has no regional  wall motion abnormalities. Left ventricular diastolic parameters were  normal.   2. Right ventricular systolic function is normal. The right ventricular  size is normal. Tricuspid regurgitation signal is inadequate for assessing  PA pressure.   3. The mitral  valve is grossly normal. No evidence of mitral valve  regurgitation. No evidence of mitral stenosis.   4. The aortic valve is tricuspid. Aortic valve regurgitation is not  visualized. Mild to moderate aortic valve sclerosis/calcification is  present, without any evidence of aortic stenosis.   5. The inferior vena cava is normal in size with greater than 50%  respiratory variability, suggesting right atrial pressure of 3 mmHg.  Assessment & Plan  1.  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  He was started on metoprolol succinate which was titrated up to 50 mg.  Repeat cardiac event monitor 11/08/2020 showed decreased PVCs.  His metoprolol succinate was increased to 50 mg twice daily.  He did not tolerate the medication well due to lightheadedness.  It was decreased to 50 mg in the morning and 25 mg at night.  He continues to tolerate medication well and notes reduced palpitations. Continue metoprolol Heart healthy low-sodium diet-salty 6 given Increase physical activity as tolerated Avoid triggers caffeine, chocolate, EtOH, dehydration, etc.  Lightheadedness-stable /chronic.  Had previous episodes of orthostatic hypotension with blood pressure medications.  Details above. Maintain p.o. hydration Continue weight loss Increase physical activity as tolerated Continue to monitor  Essential hypertension-BP today 126/88.  Well-controlled at home. Continue metoprolol, irbesartan Heart healthy low-sodium diet-salty 6 given Increase physical activity as tolerated Maintain blood pressure log  DOE-has noted improvement in his breathing with increased physical fitness.  Previously offered nuclear stress test.  Patient declined. Heart healthy low-sodium diet-salty 6 given Increase physical activity as tolerated  Morbid obesity-weight today 420.2.  Reports increased physical activity and more strict diet. Continue weight loss Heart healthy low-sodium  diet-salty 6 given Increase physical activity as tolerated  Follow-up with Dr.Schumann or me in 4-6 months.   Jossie Ng. Johann Santone NP-C    09/19/2021, 2:40 PM [facility] [place] [address] Office [phone] Fax [phone]  Notice: This dictation was prepared with Dragon dictation along with smaller phrase technology. Any transcriptional errors that result from this process are unintentional and may not be corrected upon review.  I spent 14 minutes examining this patient, reviewing medications, and using patient centered shared decision making involving her cardiac care.  Prior to her visit I spent greater than 20 minutes reviewing her past medical history,  medications, and prior cardiac tests.

## 2021-09-19 ENCOUNTER — Other Ambulatory Visit: Payer: Self-pay

## 2021-09-19 ENCOUNTER — Ambulatory Visit: Payer: Medicaid Other | Admitting: General Practice

## 2021-09-19 ENCOUNTER — Encounter: Payer: Self-pay | Admitting: General Practice

## 2021-09-19 VITALS — BP 126/88 | HR 84 | Ht 69.0 in | Wt >= 6400 oz

## 2021-09-19 DIAGNOSIS — R0609 Other forms of dyspnea: Secondary | ICD-10-CM

## 2021-09-19 DIAGNOSIS — I10 Essential (primary) hypertension: Secondary | ICD-10-CM | POA: Diagnosis not present

## 2021-09-19 DIAGNOSIS — I493 Ventricular premature depolarization: Secondary | ICD-10-CM | POA: Diagnosis not present

## 2021-09-19 DIAGNOSIS — R42 Dizziness and giddiness: Secondary | ICD-10-CM | POA: Diagnosis not present

## 2021-09-19 NOTE — Patient Instructions (Signed)
Medication Instructions:  The current medical regimen is effective;  continue present plan and medications as directed. Please refer to the Current Medication list given to you today.  *If you need a refill on your cardiac medications before your next appointment, please call your pharmacy*  Lab Work:   Testing/Procedures:  NONE    NONE  Special Instructions PLEASE READ AND FOLLOW SALTY 6-ATTACHED-1,800mg  daily  PLEASE INCREASE PHYSICAL ACTIVITY AS TOLERATED   Follow-Up: Your next appointment:  6 month(s) In Person with Donato Heinz, MD   Please call our office 2 months in advance to schedule this appointment   At [facility], you and your health needs are our priority.  As part of our continuing mission to provide you with exceptional heart care, we have created designated Provider Care Teams.  These Care Teams include your primary Cardiologist (physician) and Advanced Practice Providers (APPs -  Physician Assistants and Nurse Practitioners) who all work together to provide you with the care you need, when you need it.            6 SALTY THINGS TO AVOID     1,800MG  DAILY

## 2021-09-20 DIAGNOSIS — M25561 Pain in right knee: Secondary | ICD-10-CM | POA: Diagnosis not present

## 2021-09-21 ENCOUNTER — Ambulatory Visit: Payer: Medicaid Other | Admitting: Family Medicine

## 2021-09-21 DIAGNOSIS — M25561 Pain in right knee: Secondary | ICD-10-CM | POA: Diagnosis not present

## 2021-09-22 DIAGNOSIS — M25561 Pain in right knee: Secondary | ICD-10-CM | POA: Diagnosis not present

## 2021-09-22 LAB — COLOGUARD: Cologuard: NEGATIVE

## 2021-09-26 DIAGNOSIS — M25561 Pain in right knee: Secondary | ICD-10-CM | POA: Diagnosis not present

## 2021-09-27 DIAGNOSIS — M25561 Pain in right knee: Secondary | ICD-10-CM | POA: Diagnosis not present

## 2021-09-28 ENCOUNTER — Other Ambulatory Visit: Payer: Self-pay | Admitting: Family Medicine

## 2021-09-28 DIAGNOSIS — E1140 Type 2 diabetes mellitus with diabetic neuropathy, unspecified: Secondary | ICD-10-CM

## 2021-09-28 DIAGNOSIS — M25561 Pain in right knee: Secondary | ICD-10-CM | POA: Diagnosis not present

## 2021-09-29 DIAGNOSIS — M25561 Pain in right knee: Secondary | ICD-10-CM | POA: Diagnosis not present

## 2021-10-03 DIAGNOSIS — M25561 Pain in right knee: Secondary | ICD-10-CM | POA: Diagnosis not present

## 2021-10-04 DIAGNOSIS — M25561 Pain in right knee: Secondary | ICD-10-CM | POA: Diagnosis not present

## 2021-10-04 NOTE — Patient Instructions (Addendum)
It was wonderful to see you today.  Please bring ALL of your medications with you to every visit.   Today we talked about:  Diabetes-your A1c is 6.1 today.  We have decided to decrease your metformin to 500 mg daily Anemia-please have the gastroenterologist send [place] your Cologuard results today your hemoglobin was 11.1 please continue to take your iron supplements. Chronic knee pain-I will refer you to the sports medicine center as requested. [phone]  Please be sure to schedule follow up at the front  desk before you leave today.   If you haven't already, sign up for My Chart to have easy access to your labs results, and communication with your primary care physician.  Please call the clinic at [phone] if your symptoms worsen or you have any concerns. It was our pleasure to serve you.  Dr. Janus Molder

## 2021-10-04 NOTE — Progress Notes (Signed)
° ° °  SUBJECTIVE:   CHIEF COMPLAINT / HPI:   Diabetes Current Regimen: Trulicity 6.14 mg/wkly, metformin 500mg  BID Last A1c: 6.6 on 02/01/21  Denies polyuria, polydipsia, hypoglycemia Statin: Lipitor 80 mg daily ACE/ARB: Irbesartan 150 mg qhs  Chronic knee pain Several months of knee pain with bowling and walking activity. Would like second opinion on how to better manage the pain so that he can bowl again. He has been to an orthopedic surgeon and told to lose weight and that his knees were bone on bone. He is using voltaren gel, tylenol arthritis, and knee sleeves. He denies any recent injury or falls.   Anemia Saw stomach doctor recently and was told to take iron because he was anemic. High cologuard test results were negative. He denies rectal bleeding.   PERTINENT  PMH / PSH: Hx of gastric cancer, HTN, BMI 60  OBJECTIVE:   BP 139/88    Pulse 88    Ht 5\' 9"  (1.753 m)    Wt (!) 411 lb 9.6 oz (186.7 kg)    SpO2 100%    BMI 60.78 kg/m   No results found for this or any previous visit (from the past 24 hour(s)). Results for orders placed or performed in visit on 10/05/21 (from the past 48 hour(s))  HgB A1c     Status: None   Collection Time: 10/05/21  2:00 PM  Result Value Ref Range   Hemoglobin A1C     HbA1c POC (<> result, manual entry)     HbA1c, POC (prediabetic range)     HbA1c, POC (controlled diabetic range) 6.1 0.0 - 7.0 %  Hemoglobin     Status: None   Collection Time: 10/05/21  2:00 PM  Result Value Ref Range   Hemoglobin 11.1 11 - 14.6 g/dL   General: Appears well, no acute distress. Age appropriate. Cardiac: RRR, normal heart sounds, no murmurs Respiratory: CTAB, normal effort Knee: - Inspection: no gross deformity b/l. No swelling/effusion, erythema or bruising b/l. Skin intact - Palpation: TTP at lateral and medial joint line b/l - ROM: full active ROM with flexion and extension in knee and hip b/l - Strength: 5/5 strength b/l - Neuro/vasc: NV intact distally  b/l - Special Tests: - LIGAMENTS: negative anterior and posterior drawer, negative Lachman's, no MCL or LCL laxity  -- MENISCUS: negative McMurray's Neuro: alert and oriented Psych: normal affect  ASSESSMENT/PLAN:   Type 2 diabetes mellitus with diabetic neuropathy, without long-term current use of insulin (HCC) A1c well controlled. Decrease metformin to 500 mg daily. Consider SGLT2 at follow up. Follow up in 3 months for repeat A1c, if continues to be controlled can extend to 6 months. Consider monitoring closely with continued weight loss to de-escalate medicatio regimen.   Chronic pain of both knees Reviewed ortho note and xrays from 02/17/21. Ortho suggesting total knee replacements and weight loss prior to surgery. Xray read with severe OA. Knee exam is suggestive of OA. Patient desires second opinion. Will referral to sports medicine at this time.  - Ambulatory referral to Sports Medicine  Other iron deficiency anemia Hgb today stable. Reviewed CBC and iron panel from 11/21 and is suggestive of a normocytic anemia. Cologuard was negative, colonoscopy would have been more preferable given his long history of anemia. Will need further work up for this given gastric cancer hx. Plan to follow up in 1 month.     Gerlene Fee, [place]

## 2021-10-05 ENCOUNTER — Telehealth: Payer: Self-pay | Admitting: Family Medicine

## 2021-10-05 ENCOUNTER — Encounter: Payer: Self-pay | Admitting: Family Medicine

## 2021-10-05 ENCOUNTER — Other Ambulatory Visit: Payer: Self-pay

## 2021-10-05 ENCOUNTER — Ambulatory Visit (INDEPENDENT_AMBULATORY_CARE_PROVIDER_SITE_OTHER): Payer: Medicaid Other | Admitting: Family Medicine

## 2021-10-05 VITALS — BP 139/88 | HR 88 | Ht 69.0 in | Wt >= 6400 oz

## 2021-10-05 DIAGNOSIS — M25562 Pain in left knee: Secondary | ICD-10-CM | POA: Diagnosis not present

## 2021-10-05 DIAGNOSIS — E1140 Type 2 diabetes mellitus with diabetic neuropathy, unspecified: Secondary | ICD-10-CM | POA: Diagnosis not present

## 2021-10-05 DIAGNOSIS — D508 Other iron deficiency anemias: Secondary | ICD-10-CM | POA: Diagnosis not present

## 2021-10-05 DIAGNOSIS — E119 Type 2 diabetes mellitus without complications: Secondary | ICD-10-CM

## 2021-10-05 DIAGNOSIS — G8929 Other chronic pain: Secondary | ICD-10-CM | POA: Diagnosis not present

## 2021-10-05 DIAGNOSIS — M25561 Pain in right knee: Secondary | ICD-10-CM

## 2021-10-05 LAB — POCT GLYCOSYLATED HEMOGLOBIN (HGB A1C): HbA1c, POC (controlled diabetic range): 6.1 % (ref 0.0–7.0)

## 2021-10-05 LAB — POCT HEMOGLOBIN: Hemoglobin: 11.1 g/dL (ref 11–14.6)

## 2021-10-05 MED ORDER — IRON 325 (65 FE) MG PO TABS: 1.0000 | ORAL_TABLET | Freq: Every day | ORAL | 0 refills | Status: DC

## 2021-10-05 MED ORDER — METFORMIN HCL ER 500 MG PO TB24: 500.0000 mg | ORAL_TABLET | Freq: Every day | ORAL | 0 refills | Status: DC

## 2021-10-05 NOTE — Telephone Encounter (Signed)
Called patient he has not been to pharmacy. Will discontinue iron. Discussed anemia work up in 1 month.   Gerlene Fee, DO 10/05/2021, 5:21 PM PGY-3, [place]

## 2021-10-06 DIAGNOSIS — M25561 Pain in right knee: Secondary | ICD-10-CM | POA: Diagnosis not present

## 2021-10-10 ENCOUNTER — Telehealth: Payer: Self-pay | Admitting: *Deleted

## 2021-10-10 DIAGNOSIS — G8929 Other chronic pain: Secondary | ICD-10-CM

## 2021-10-10 DIAGNOSIS — M25561 Pain in right knee: Secondary | ICD-10-CM | POA: Diagnosis not present

## 2021-10-10 NOTE — Telephone Encounter (Signed)
Patient called today about his "referral" for a power chair. Unsure if provider notified RN team in November when order was placed.  Will check with them to see if they can send to [employer].  Patient may need a new order depending on what [employer] says.  Kymberly Blomberg,CMA

## 2021-10-11 DIAGNOSIS — M25561 Pain in right knee: Secondary | ICD-10-CM | POA: Diagnosis not present

## 2021-10-12 DIAGNOSIS — M25561 Pain in right knee: Secondary | ICD-10-CM | POA: Diagnosis not present

## 2021-10-13 DIAGNOSIS — M25561 Pain in right knee: Secondary | ICD-10-CM | POA: Diagnosis not present

## 2021-10-16 ENCOUNTER — Other Ambulatory Visit: Payer: Self-pay | Admitting: Family Medicine

## 2021-10-16 DIAGNOSIS — E119 Type 2 diabetes mellitus without complications: Secondary | ICD-10-CM

## 2021-10-17 DIAGNOSIS — M25561 Pain in right knee: Secondary | ICD-10-CM | POA: Diagnosis not present

## 2021-10-18 DIAGNOSIS — M25561 Pain in right knee: Secondary | ICD-10-CM | POA: Diagnosis not present

## 2021-10-18 NOTE — Addendum Note (Signed)
Addended by: Caralee Ates on: 10/18/2021 01:36 PM ? ? Modules accepted: Orders ? ?

## 2021-10-19 ENCOUNTER — Ambulatory Visit (INDEPENDENT_AMBULATORY_CARE_PROVIDER_SITE_OTHER): Payer: Medicaid Other | Admitting: Family Medicine

## 2021-10-19 VITALS — BP 134/74 | Ht 69.0 in | Wt >= 6400 oz

## 2021-10-19 DIAGNOSIS — M1711 Unilateral primary osteoarthritis, right knee: Secondary | ICD-10-CM

## 2021-10-19 DIAGNOSIS — M1712 Unilateral primary osteoarthritis, left knee: Secondary | ICD-10-CM | POA: Diagnosis not present

## 2021-10-19 DIAGNOSIS — M170 Bilateral primary osteoarthritis of knee: Secondary | ICD-10-CM | POA: Diagnosis not present

## 2021-10-19 DIAGNOSIS — M25561 Pain in right knee: Secondary | ICD-10-CM | POA: Diagnosis not present

## 2021-10-19 NOTE — Progress Notes (Signed)
? ?  PCP: Craig Fee, DO ? ?Subjective:  ? ?HPI: ?[name] is a 61 y.o. male here for chronic bilateral knee pain in setting of end-stage osteoarthritis. This has been controlled with topical and systemic NSAID in the past. Patient was evaluated by orthopedic surgery on 02/17/2021 for acute on chronic knee pain after falling. Bilateral knee x-rays obtained in office with severe tricompartmental end-stage osteoarthritis. He was advised to take Tylenol for pain control, cortisone injections and viscosupplementation, and weight loss surgery/clinic referral. Patient is presenting today for second opinion.  ?[name] endorses ongoing bilateral knee pain and is using a cane to assist with ambulation. He ambulates minimally due to the knee pain and obesity but has been utilizing his total home gym twice daily. Patient notes some relief of pain with voltaren gel but endorses that he is getting a rash on his right knee. He would like to get back into bowling and water aerobics as this is how he previously lost weight pre-COVID. He is wanting a second opinion on management of his bilateral knee osteoarthritis.  ? ?Previous Interventions Tried: Topical voltaren gel ? ?Review of Systems:  Per HPI.  ? ?[place], medications and smoking status reviewed. ? ?    ?Objective:  ?Physical Exam: ? ?Gen: awake, alert, NAD, comfortable in exam room ?Pulm: breathing initially labored with exertion; improved with rest  ?Knee:  ?- Inspection: no gross deformity. No swelling/effusion. Skin intact ?- Palpation: no TTP of joint line or patella; +crepitus bilaterally ?- ROM: full active ROM with flexion in knee, loss of 5 degrees of extension b/l in knees ?- Strength: 5/5 strength BLE ?- Neuro/vasc: NV intact ?- Special tests difficult to perform due to body habits, but grossly stable knees  ? ?Assessment & Plan:  ?1. Severe tricompartmental bilateral knee osteoarthritis ?Patient with chronic knee pain dating back to 2014 with recent  evaluation by orthopedic surgeon for progressively worsening knee pain presenting for second opinion on treatment options. His most recent x-rays in July 2022 with severe tricompartmental end-stage osteoarthritis. Patient is not currently a surgical candidate due to severe obesity. He is following up for discussion of treatment options. He does not want to get corticosteroid injections or viscosupplmentation at this time. Recommended for continued weight loss and use of topical NSAIDs as needed. Discussed tylenol for arthritis pain, but he notes that this is not helpful for him.  Given continued need for stability training and leg strengthening, would benefit from PT and likely have some improvement in his knee pain and strengthening with this.  Recommend water aerobics if possible.  Patient also recommended for physical therapy to assist with knee-strengthening exercises and mobility. Discussed if he requires further pain management to f/u with PCP in regards to this.  He can f/u here as needed if decides that he would be open to injections, otherwise he can f/u with PCP as there would not be much else to offer in terms of non-surgical treatment for his knee OA. ? ? ? ?Craig Heck, MD ?Internal Medicine, PGY-3 ?10/19/21 3:07 PM ? ? ?Sports Medicine Fellow Addendum ?  ?I have independently interviewed and examined the patient. I have discussed the above with the original author and agree with their documentation. My edits for correction/addition/clarification have been made.   ? ?Craig [name], D.O. ?PGY-4, [facility] ?10/19/2021 3:07 PM  ? ?Addendum:  I was the preceptor for this visit and available for immediate consultation.  Craig Lemon MD CAQSM ? ?

## 2021-10-19 NOTE — Patient Instructions (Addendum)
Mr Keiland Pickering, ? ?It was a pleasure seeing you in clinic. Today we discussed:  ? ?Bilateral knee arthritis: ?- Continue voltaren gel four times daily as needed  ?- Home health physical therapy ordered at this visit - as we discussed, this may or may not be covered by your insurance [facility] will be calling you to set up home physical therapy......Marland Kitchen [phone]) ?- Quad strengthening exercises provided at this visit; please do these at home ?- Options regarding treatment include: topical NSAID therapy, acetaminophen as needed for pain control, physical therapy with strengthening exercises, corticosteroid or gel injections, and total knee replacement.  ?- You may follow up with your PCP to discuss further pain control options as needed  ?- Follow up in sports medicine as needed  ? ?Take care!  ? ?

## 2021-10-19 NOTE — Progress Notes (Deleted)
pp

## 2021-10-20 DIAGNOSIS — M25561 Pain in right knee: Secondary | ICD-10-CM | POA: Diagnosis not present

## 2021-10-24 DIAGNOSIS — M25561 Pain in right knee: Secondary | ICD-10-CM | POA: Diagnosis not present

## 2021-10-25 DIAGNOSIS — M25561 Pain in right knee: Secondary | ICD-10-CM | POA: Diagnosis not present

## 2021-10-26 DIAGNOSIS — M25561 Pain in right knee: Secondary | ICD-10-CM | POA: Diagnosis not present

## 2021-10-27 DIAGNOSIS — M25561 Pain in right knee: Secondary | ICD-10-CM | POA: Diagnosis not present

## 2021-10-31 DIAGNOSIS — M25561 Pain in right knee: Secondary | ICD-10-CM | POA: Diagnosis not present

## 2021-11-01 DIAGNOSIS — M25561 Pain in right knee: Secondary | ICD-10-CM | POA: Diagnosis not present

## 2021-11-02 DIAGNOSIS — M25561 Pain in right knee: Secondary | ICD-10-CM | POA: Diagnosis not present

## 2021-11-03 DIAGNOSIS — M25561 Pain in right knee: Secondary | ICD-10-CM | POA: Diagnosis not present

## 2021-11-07 ENCOUNTER — Ambulatory Visit: Payer: Medicaid Other | Admitting: Family Medicine

## 2021-11-07 DIAGNOSIS — M25561 Pain in right knee: Secondary | ICD-10-CM | POA: Diagnosis not present

## 2021-11-07 NOTE — Addendum Note (Signed)
Addended by: Cyd Silence on: 11/07/2021 11:14 AM ? ? Modules accepted: Orders ? ?

## 2021-11-07 NOTE — Patient Instructions (Incomplete)
It was wonderful to see you today. ? ?Please bring ALL of your medications with you to every visit.  ? ?Today we talked about: ? ?** ? ?Please be sure to schedule follow up at the front  desk before you leave today.  ? ?If you haven't already, sign up for My Chart to have easy access to your labs results, and communication with your primary care physician. ? ?Please call the clinic at [phone] if your symptoms worsen or you have any concerns. It was our pleasure to serve you. ? ?Dr. Janus Molder ? ?

## 2021-11-07 NOTE — Progress Notes (Deleted)
? ? ?  SUBJECTIVE:  ? ?CHIEF COMPLAINT / HPI:  ? ?*** ? ?PERTINENT  PMH / PSH: *** ? ?OBJECTIVE:  ? ?There were no vitals taken for this visit.  ?*** ? ?ASSESSMENT/PLAN:  ? ?No problem-specific Assessment & Plan notes found for this encounter. ?  ? ? ?Craig Trella Autry-Lott, DO ?[place]  ?

## 2021-11-08 DIAGNOSIS — M25561 Pain in right knee: Secondary | ICD-10-CM | POA: Diagnosis not present

## 2021-11-09 DIAGNOSIS — M25561 Pain in right knee: Secondary | ICD-10-CM | POA: Diagnosis not present

## 2021-11-10 DIAGNOSIS — M25561 Pain in right knee: Secondary | ICD-10-CM | POA: Diagnosis not present

## 2021-11-14 DIAGNOSIS — M25561 Pain in right knee: Secondary | ICD-10-CM | POA: Diagnosis not present

## 2021-11-15 ENCOUNTER — Telehealth: Payer: Self-pay

## 2021-11-15 DIAGNOSIS — M25561 Pain in right knee: Secondary | ICD-10-CM | POA: Diagnosis not present

## 2021-11-15 NOTE — Telephone Encounter (Signed)
Mickel Baas [facility] PT calls nurse line requesting verbal orders for [facility] PT as follows.  ? ?2x a week for 2 weeks  ?1x a week every other week for 6 weeks.  ? ?Verbal order given.  ?

## 2021-11-16 ENCOUNTER — Encounter: Payer: Self-pay | Admitting: Family Medicine

## 2021-11-16 ENCOUNTER — Ambulatory Visit (INDEPENDENT_AMBULATORY_CARE_PROVIDER_SITE_OTHER): Payer: Medicaid Other | Admitting: Family Medicine

## 2021-11-16 VITALS — BP 118/80 | HR 85 | Temp 98.4°F | Ht 69.0 in | Wt >= 6400 oz

## 2021-11-16 DIAGNOSIS — E119 Type 2 diabetes mellitus without complications: Secondary | ICD-10-CM

## 2021-11-16 DIAGNOSIS — R21 Rash and other nonspecific skin eruption: Secondary | ICD-10-CM | POA: Diagnosis not present

## 2021-11-16 DIAGNOSIS — M25561 Pain in right knee: Secondary | ICD-10-CM | POA: Diagnosis not present

## 2021-11-16 NOTE — Progress Notes (Signed)
? ? ?  SUBJECTIVE:  ? ?CHIEF COMPLAINT / HPI:  ? ?Diabetes ?Current Regimen: Metformin 500 twice daily ?Last A1c: 6.1 on 10/05/2021 ?Denies polyuria, polydipsia, hypoglycemia ?Last Eye Exam: 2022 ?Statin: Lipitor 80 mg daily ?ACE/ARB: Irbesartan 150 mg nightly ? ?Skin rash ?Endorsing chronic right leg rash.  Concern for psoriasis since his brother has 1.  Has not seen a dermatologist in the past.  Has been using Voltaren gel for arthritic knees in that area.  Wanted to know if it could be causing rash.  Has been using Voltaren gel for years.  He does endorse pruritus.  Denies any other skin lesions of this characteristic. ? ?PERTINENT  PMH / PSH: HTN, history of gastric cancer (needs colonoscopy planning to call GI to set this up; prior negative cologuard) ? ?OBJECTIVE:  ? ?BP 118/80   Pulse 85   Temp 98.4 ?F (36.9 ?C)   Ht '5\' 9"'$  (1.753 m)   Wt (!) 404 lb 3.2 oz (183.3 kg)   SpO2 96%   BMI 59.69 kg/m?   ?General: Appears well, no acute distress. Age appropriate. ?Cardiac: RRR, normal heart sounds, no murmurs ?Respiratory: CTAB, normal effort ?Extremities: No edema. ?Skin: Right patella and tibial tuberosity skin appearing hyper pigmented and leathery.  No other surrounding erythema no crusting and well-defined borders. ?Neuro: alert and oriented ?Psych: normal affect ? ?ASSESSMENT/PLAN:  ? ?1. Controlled type 2 diabetes mellitus without complication, without long-term current use of insulin ([place]) ?-Continue current medication ?- Follow-up in May for repeat A1c ?- Consider decreasing metformin ? ?2. Skin rash ?Worsening skin rash over several months located over patella and proximal tibia.  Pruritic.  Not likely caused by Voltaren gel especially since patient has been using it chronically is not a new medication.  Consider postinflammatory hyperpigmentation.  Offered patient to schedule in our dermatology clinic.  In the meantime he can use over-the-counter hydrocortisone cream to minimize pruritus which in turn  would minimize continued skin irritation via scratching. ? ? ?Of note, handicap placard filled out today due to patient's severe OA and inability to walk long distance without resting also history of assistive device such as cane intermittently. ? ? ?Minah Axelrod Autry-Lott, DO ?[place]  ?

## 2021-11-16 NOTE — Patient Instructions (Addendum)
It was wonderful to see you today. ? ?Please bring ALL of your medications with you to every visit.  ? ?Today we talked about: ? ?Please follow-up with gastroenterologist for colonoscopy. ?Today I filled out your handicap placard ?Due to your concern for psoriasis I would make a office visit with our dermatology clinic before you leave today. ?Follow-up with me at the end of May. ? ?Please be sure to schedule follow up at the front  desk before you leave today.  ? ?If you haven't already, sign up for My Chart to have easy access to your labs results, and communication with your primary care physician. ? ?Please call the clinic at [phone] if your symptoms worsen or you have any concerns. It was our pleasure to serve you. ? ?Dr. Janus Molder ?

## 2021-11-17 DIAGNOSIS — M25561 Pain in right knee: Secondary | ICD-10-CM | POA: Diagnosis not present

## 2021-11-20 ENCOUNTER — Other Ambulatory Visit: Payer: Self-pay | Admitting: Family Medicine

## 2021-11-20 DIAGNOSIS — E1140 Type 2 diabetes mellitus with diabetic neuropathy, unspecified: Secondary | ICD-10-CM

## 2021-11-21 DIAGNOSIS — M25561 Pain in right knee: Secondary | ICD-10-CM | POA: Diagnosis not present

## 2021-11-22 DIAGNOSIS — M25561 Pain in right knee: Secondary | ICD-10-CM | POA: Diagnosis not present

## 2021-11-23 DIAGNOSIS — M25561 Pain in right knee: Secondary | ICD-10-CM | POA: Diagnosis not present

## 2021-11-24 DIAGNOSIS — M25561 Pain in right knee: Secondary | ICD-10-CM | POA: Diagnosis not present

## 2021-11-25 DIAGNOSIS — M170 Bilateral primary osteoarthritis of knee: Secondary | ICD-10-CM | POA: Diagnosis not present

## 2021-11-25 DIAGNOSIS — E669 Obesity, unspecified: Secondary | ICD-10-CM | POA: Diagnosis not present

## 2021-11-25 DIAGNOSIS — Z7982 Long term (current) use of aspirin: Secondary | ICD-10-CM | POA: Diagnosis not present

## 2021-11-25 DIAGNOSIS — Z7984 Long term (current) use of oral hypoglycemic drugs: Secondary | ICD-10-CM | POA: Diagnosis not present

## 2021-11-25 DIAGNOSIS — Z794 Long term (current) use of insulin: Secondary | ICD-10-CM | POA: Diagnosis not present

## 2021-11-25 DIAGNOSIS — R21 Rash and other nonspecific skin eruption: Secondary | ICD-10-CM | POA: Diagnosis not present

## 2021-11-25 DIAGNOSIS — G8929 Other chronic pain: Secondary | ICD-10-CM | POA: Diagnosis not present

## 2021-11-25 DIAGNOSIS — I10 Essential (primary) hypertension: Secondary | ICD-10-CM | POA: Diagnosis not present

## 2021-11-25 DIAGNOSIS — Z9181 History of falling: Secondary | ICD-10-CM | POA: Diagnosis not present

## 2021-11-25 DIAGNOSIS — E119 Type 2 diabetes mellitus without complications: Secondary | ICD-10-CM | POA: Diagnosis not present

## 2021-11-25 DIAGNOSIS — Z6841 Body Mass Index (BMI) 40.0 and over, adult: Secondary | ICD-10-CM | POA: Diagnosis not present

## 2021-11-28 DIAGNOSIS — M25561 Pain in right knee: Secondary | ICD-10-CM | POA: Diagnosis not present

## 2021-11-29 DIAGNOSIS — M25561 Pain in right knee: Secondary | ICD-10-CM | POA: Diagnosis not present

## 2021-11-30 DIAGNOSIS — Z6841 Body Mass Index (BMI) 40.0 and over, adult: Secondary | ICD-10-CM | POA: Diagnosis not present

## 2021-11-30 DIAGNOSIS — Z7984 Long term (current) use of oral hypoglycemic drugs: Secondary | ICD-10-CM | POA: Diagnosis not present

## 2021-11-30 DIAGNOSIS — M170 Bilateral primary osteoarthritis of knee: Secondary | ICD-10-CM | POA: Diagnosis not present

## 2021-11-30 DIAGNOSIS — R21 Rash and other nonspecific skin eruption: Secondary | ICD-10-CM | POA: Diagnosis not present

## 2021-11-30 DIAGNOSIS — Z794 Long term (current) use of insulin: Secondary | ICD-10-CM | POA: Diagnosis not present

## 2021-11-30 DIAGNOSIS — Z7982 Long term (current) use of aspirin: Secondary | ICD-10-CM | POA: Diagnosis not present

## 2021-11-30 DIAGNOSIS — M25561 Pain in right knee: Secondary | ICD-10-CM | POA: Diagnosis not present

## 2021-11-30 DIAGNOSIS — E119 Type 2 diabetes mellitus without complications: Secondary | ICD-10-CM | POA: Diagnosis not present

## 2021-11-30 DIAGNOSIS — Z9181 History of falling: Secondary | ICD-10-CM | POA: Diagnosis not present

## 2021-11-30 DIAGNOSIS — I10 Essential (primary) hypertension: Secondary | ICD-10-CM | POA: Diagnosis not present

## 2021-11-30 DIAGNOSIS — E669 Obesity, unspecified: Secondary | ICD-10-CM | POA: Diagnosis not present

## 2021-11-30 DIAGNOSIS — G8929 Other chronic pain: Secondary | ICD-10-CM | POA: Diagnosis not present

## 2021-12-01 DIAGNOSIS — M25561 Pain in right knee: Secondary | ICD-10-CM | POA: Diagnosis not present

## 2021-12-02 DIAGNOSIS — M170 Bilateral primary osteoarthritis of knee: Secondary | ICD-10-CM | POA: Diagnosis not present

## 2021-12-02 DIAGNOSIS — Z9181 History of falling: Secondary | ICD-10-CM | POA: Diagnosis not present

## 2021-12-02 DIAGNOSIS — G8929 Other chronic pain: Secondary | ICD-10-CM | POA: Diagnosis not present

## 2021-12-02 DIAGNOSIS — I10 Essential (primary) hypertension: Secondary | ICD-10-CM | POA: Diagnosis not present

## 2021-12-02 DIAGNOSIS — E669 Obesity, unspecified: Secondary | ICD-10-CM | POA: Diagnosis not present

## 2021-12-02 DIAGNOSIS — E119 Type 2 diabetes mellitus without complications: Secondary | ICD-10-CM | POA: Diagnosis not present

## 2021-12-02 DIAGNOSIS — Z6841 Body Mass Index (BMI) 40.0 and over, adult: Secondary | ICD-10-CM | POA: Diagnosis not present

## 2021-12-02 DIAGNOSIS — Z7984 Long term (current) use of oral hypoglycemic drugs: Secondary | ICD-10-CM | POA: Diagnosis not present

## 2021-12-02 DIAGNOSIS — Z7982 Long term (current) use of aspirin: Secondary | ICD-10-CM | POA: Diagnosis not present

## 2021-12-02 DIAGNOSIS — R21 Rash and other nonspecific skin eruption: Secondary | ICD-10-CM | POA: Diagnosis not present

## 2021-12-02 DIAGNOSIS — Z794 Long term (current) use of insulin: Secondary | ICD-10-CM | POA: Diagnosis not present

## 2021-12-05 DIAGNOSIS — M25561 Pain in right knee: Secondary | ICD-10-CM | POA: Diagnosis not present

## 2021-12-06 DIAGNOSIS — M25561 Pain in right knee: Secondary | ICD-10-CM | POA: Diagnosis not present

## 2021-12-07 DIAGNOSIS — M25561 Pain in right knee: Secondary | ICD-10-CM | POA: Diagnosis not present

## 2021-12-08 DIAGNOSIS — M25561 Pain in right knee: Secondary | ICD-10-CM | POA: Diagnosis not present

## 2021-12-12 DIAGNOSIS — M25561 Pain in right knee: Secondary | ICD-10-CM | POA: Diagnosis not present

## 2021-12-13 DIAGNOSIS — M25561 Pain in right knee: Secondary | ICD-10-CM | POA: Diagnosis not present

## 2021-12-14 DIAGNOSIS — E119 Type 2 diabetes mellitus without complications: Secondary | ICD-10-CM | POA: Diagnosis not present

## 2021-12-14 DIAGNOSIS — R21 Rash and other nonspecific skin eruption: Secondary | ICD-10-CM | POA: Diagnosis not present

## 2021-12-14 DIAGNOSIS — Z9181 History of falling: Secondary | ICD-10-CM | POA: Diagnosis not present

## 2021-12-14 DIAGNOSIS — Z7982 Long term (current) use of aspirin: Secondary | ICD-10-CM | POA: Diagnosis not present

## 2021-12-14 DIAGNOSIS — Z6841 Body Mass Index (BMI) 40.0 and over, adult: Secondary | ICD-10-CM | POA: Diagnosis not present

## 2021-12-14 DIAGNOSIS — Z7984 Long term (current) use of oral hypoglycemic drugs: Secondary | ICD-10-CM | POA: Diagnosis not present

## 2021-12-14 DIAGNOSIS — M170 Bilateral primary osteoarthritis of knee: Secondary | ICD-10-CM | POA: Diagnosis not present

## 2021-12-14 DIAGNOSIS — G8929 Other chronic pain: Secondary | ICD-10-CM | POA: Diagnosis not present

## 2021-12-14 DIAGNOSIS — Z794 Long term (current) use of insulin: Secondary | ICD-10-CM | POA: Diagnosis not present

## 2021-12-14 DIAGNOSIS — I10 Essential (primary) hypertension: Secondary | ICD-10-CM | POA: Diagnosis not present

## 2021-12-14 DIAGNOSIS — E669 Obesity, unspecified: Secondary | ICD-10-CM | POA: Diagnosis not present

## 2021-12-14 DIAGNOSIS — M25561 Pain in right knee: Secondary | ICD-10-CM | POA: Diagnosis not present

## 2021-12-15 ENCOUNTER — Telehealth: Payer: Self-pay | Admitting: Family Medicine

## 2021-12-15 ENCOUNTER — Ambulatory Visit (INDEPENDENT_AMBULATORY_CARE_PROVIDER_SITE_OTHER): Payer: Medicaid Other | Admitting: Family Medicine

## 2021-12-15 VITALS — BP 114/80 | HR 80 | Wt >= 6400 oz

## 2021-12-15 DIAGNOSIS — L819 Disorder of pigmentation, unspecified: Secondary | ICD-10-CM | POA: Diagnosis not present

## 2021-12-15 DIAGNOSIS — L280 Lichen simplex chronicus: Secondary | ICD-10-CM | POA: Diagnosis not present

## 2021-12-15 DIAGNOSIS — E1140 Type 2 diabetes mellitus with diabetic neuropathy, unspecified: Secondary | ICD-10-CM | POA: Diagnosis not present

## 2021-12-15 DIAGNOSIS — M25561 Pain in right knee: Secondary | ICD-10-CM | POA: Diagnosis not present

## 2021-12-15 MED ORDER — TRIAMCINOLONE ACETONIDE 0.5 % EX OINT: 1.0000 "application " | TOPICAL_OINTMENT | Freq: Two times a day (BID) | CUTANEOUS | 0 refills | Status: DC

## 2021-12-15 MED ORDER — TRULICITY 0.75 MG/0.5ML ~~LOC~~ SOAJ: 0.7500 mg | SUBCUTANEOUS | 1 refills | Status: DC

## 2021-12-15 NOTE — Telephone Encounter (Signed)
Patient dropped off form at front desk for [place].  Verified that patient section of form has been completed.  Last DOS/WCC with PCP was 12/15/21.  Placed form in red team folder to be completed by clinical staff.  Creig Hines  ?

## 2021-12-15 NOTE — Progress Notes (Signed)
? ?  SUBJECTIVE:  ? ?CHIEF COMPLAINT / HPI:  ? ?Bilateral elbow and knee rash ?Long standing issue with darkening skin and irritation of these areas. He states he feels he has gotten worse with his chronic use of voltaren gel. It is not painful or pruritic. He is concerned he may have psoriasis because his brother had similar skin issues when playing football. He is using emollient such as vaseline in these areas to help with skin dryness.  ? ?PERTINENT  PMH / PSH: T2DM (Needs refilled of trulicity) ? ?OBJECTIVE:  ? ?BP 114/80   Pulse 80   Wt (!) 401 lb 9.6 oz (182.2 kg)   SpO2 99%   BMI 59.31 kg/m?   ?General: Appears well, no acute distress. Age appropriate. Accompanied by his personal nurse today.  ?Respiratory: CTAB, normal effort ?Skin/Extremities: Bilateral hyperpigmentation and leather like dry skin over bony prominences such as elbows and knees. Inflammatory changes most notable over the right knee. See pics below.  ?Neuro: alert and oriented ?Psych: normal affect ?  ?Media Information ?Document Information ? ?Photos  ?Left elbow  ?12/15/2021 13:41  ?Attached To:  ?Office Visit on 12/15/21 with [facility]  ? ?Source Information ? ?Autry-Lott, Naaman Plummer DO  [facility]  ? ?  ?Media Information ?Document Information ? ?Photos  ?Right elbow  ?12/15/2021 13:41  ?Attached To:  ?Office Visit on 12/15/21 with [facility]  ? ?Source Information ? ?Autry-Lott, Naaman Plummer DO  [facility]  ?  ?Media Information ?Document Information ? ?Photos  ?Left knee   ?12/15/2021 13:42  ?Attached To:  ?Office Visit on 12/15/21 with [facility]  ? ?Source Information ? ?Autry-Lott, Naaman Plummer DO  [facility]  ?  ?Media Information ?Document Information ? ?Photos  ?Right knee  ?12/15/2021 13:42  ?Attached To:  ?Office Visit on 12/15/21 with [facility]  ? ?Source Information ? ?Autry-Lott, Naaman Plummer DO  [facility]  ? ?ASSESSMENT/PLAN:  ? ?1.  Hyperpigmentation, chronic ?Possibly post inflammatory changes. Located over elbows and knees bilaterally. The worse the being over the right knee. Patient concern for psoriasis, will biopsy today. Could possibly be irritation due to voltaren gel and chronic pressure over these bony prominences. Discussed stopping voltaren gel, avoiding these pressure points, and utilizing a moderate steroid cream for 2 weeks. Patient voiced understanding. ?  ?After informed written consent was obtained, using Betadine for cleansing and 1% Lidocaine without epinephrine for anesthetic, with sterile technique a 2 mm punch biopsy was used to obtain a biopsy specimen of the lesion. Hemostasis was obtained by pressure and wound was dressed with pressure dressing. Antibiotic dressing is applied, and wound care instructions provided. The specimen is labeled and sent to pathology for evaluation. The procedure was well tolerated without complications. ?- Dermatology pathology ? ?2. Type 2 diabetes mellitus with diabetic neuropathy, without long-term current use of insulin ([place]) ?Refill. ?- Dulaglutide (TRULICITY) 5.03 TW/6.5KC SOPN; Inject 0.75 mg into the skin once a week.  Dispense: 2 mL; Refill: 1 ?Konnie Noffsinger Autry-Lott, DO ?[place]  ?

## 2021-12-15 NOTE — Patient Instructions (Signed)
It was nice seeing you today. We took a small biopsy of the skin of your right knee. We will contact you soon with results. Please, see [place] soon if you have any concerns. Otherwise, follow-up with your PCP as needed. ?

## 2021-12-16 NOTE — Telephone Encounter (Signed)
Form signed and placed in triage box.  ? ?Gerlene Fee, DO ?12/16/2021, 10:03 AM ?PGY-3, [place] ? ?

## 2021-12-16 NOTE — Telephone Encounter (Signed)
Handed paperwork to Dr. Janus Molder as I was working with her. ? ?.Ozella Almond, CMA ? ? ?

## 2021-12-19 ENCOUNTER — Other Ambulatory Visit: Payer: Self-pay | Admitting: Family Medicine

## 2021-12-19 DIAGNOSIS — M25561 Pain in right knee: Secondary | ICD-10-CM | POA: Diagnosis not present

## 2021-12-20 DIAGNOSIS — M25561 Pain in right knee: Secondary | ICD-10-CM | POA: Diagnosis not present

## 2021-12-20 NOTE — Telephone Encounter (Signed)
Form place up front for pick up.  ? ?Copy made for batch scanning.  ? ?Patient aware.  ?

## 2021-12-21 ENCOUNTER — Telehealth: Payer: Self-pay | Admitting: Family Medicine

## 2021-12-21 DIAGNOSIS — M25561 Pain in right knee: Secondary | ICD-10-CM | POA: Diagnosis not present

## 2021-12-21 MED ORDER — HALOBETASOL PROPIONATE 0.05 % EX CREA: TOPICAL_CREAM | Freq: Two times a day (BID) | CUTANEOUS | 0 refills | Status: DC

## 2021-12-21 NOTE — Telephone Encounter (Signed)
-----   Message from Lind Covert, MD sent at 12/21/2021  7:50 AM EDT ----- ?Regarding: Derm Path ?[place] ? ?I think you saw this patient? ? ?Pls let me know that you are handling it ? ?Thnks ? ?LC ?----- Message ----- ?From: Interface, [facility] ?Sent: 12/19/2021   3:22 PM EDT ?To: Lind Covert, MD ? ? ?

## 2021-12-21 NOTE — Telephone Encounter (Signed)
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 ? ?I discussed irritant avoidance and initiation of high potency steroid. He can finish his Triamcinolone and resume Halobetasol.   ?

## 2021-12-22 DIAGNOSIS — M25561 Pain in right knee: Secondary | ICD-10-CM | POA: Diagnosis not present

## 2021-12-26 DIAGNOSIS — M25561 Pain in right knee: Secondary | ICD-10-CM | POA: Diagnosis not present

## 2021-12-27 DIAGNOSIS — M25561 Pain in right knee: Secondary | ICD-10-CM | POA: Diagnosis not present

## 2021-12-28 DIAGNOSIS — E669 Obesity, unspecified: Secondary | ICD-10-CM | POA: Diagnosis not present

## 2021-12-28 DIAGNOSIS — Z7984 Long term (current) use of oral hypoglycemic drugs: Secondary | ICD-10-CM | POA: Diagnosis not present

## 2021-12-28 DIAGNOSIS — Z9181 History of falling: Secondary | ICD-10-CM | POA: Diagnosis not present

## 2021-12-28 DIAGNOSIS — I10 Essential (primary) hypertension: Secondary | ICD-10-CM | POA: Diagnosis not present

## 2021-12-28 DIAGNOSIS — M25561 Pain in right knee: Secondary | ICD-10-CM | POA: Diagnosis not present

## 2021-12-28 DIAGNOSIS — Z6841 Body Mass Index (BMI) 40.0 and over, adult: Secondary | ICD-10-CM | POA: Diagnosis not present

## 2021-12-28 DIAGNOSIS — M170 Bilateral primary osteoarthritis of knee: Secondary | ICD-10-CM | POA: Diagnosis not present

## 2021-12-28 DIAGNOSIS — Z794 Long term (current) use of insulin: Secondary | ICD-10-CM | POA: Diagnosis not present

## 2021-12-28 DIAGNOSIS — R21 Rash and other nonspecific skin eruption: Secondary | ICD-10-CM | POA: Diagnosis not present

## 2021-12-28 DIAGNOSIS — Z7982 Long term (current) use of aspirin: Secondary | ICD-10-CM | POA: Diagnosis not present

## 2021-12-28 DIAGNOSIS — E119 Type 2 diabetes mellitus without complications: Secondary | ICD-10-CM | POA: Diagnosis not present

## 2021-12-28 DIAGNOSIS — G8929 Other chronic pain: Secondary | ICD-10-CM | POA: Diagnosis not present

## 2021-12-29 DIAGNOSIS — M25561 Pain in right knee: Secondary | ICD-10-CM | POA: Diagnosis not present

## 2022-01-10 DIAGNOSIS — E669 Obesity, unspecified: Secondary | ICD-10-CM | POA: Diagnosis not present

## 2022-01-10 DIAGNOSIS — G8929 Other chronic pain: Secondary | ICD-10-CM | POA: Diagnosis not present

## 2022-01-10 DIAGNOSIS — E119 Type 2 diabetes mellitus without complications: Secondary | ICD-10-CM | POA: Diagnosis not present

## 2022-01-10 DIAGNOSIS — R21 Rash and other nonspecific skin eruption: Secondary | ICD-10-CM | POA: Diagnosis not present

## 2022-01-10 DIAGNOSIS — Z7982 Long term (current) use of aspirin: Secondary | ICD-10-CM | POA: Diagnosis not present

## 2022-01-10 DIAGNOSIS — Z7984 Long term (current) use of oral hypoglycemic drugs: Secondary | ICD-10-CM | POA: Diagnosis not present

## 2022-01-10 DIAGNOSIS — Z6841 Body Mass Index (BMI) 40.0 and over, adult: Secondary | ICD-10-CM | POA: Diagnosis not present

## 2022-01-10 DIAGNOSIS — I10 Essential (primary) hypertension: Secondary | ICD-10-CM | POA: Diagnosis not present

## 2022-01-10 DIAGNOSIS — Z794 Long term (current) use of insulin: Secondary | ICD-10-CM | POA: Diagnosis not present

## 2022-01-10 DIAGNOSIS — M170 Bilateral primary osteoarthritis of knee: Secondary | ICD-10-CM | POA: Diagnosis not present

## 2022-01-10 DIAGNOSIS — M25561 Pain in right knee: Secondary | ICD-10-CM | POA: Diagnosis not present

## 2022-01-10 DIAGNOSIS — Z9181 History of falling: Secondary | ICD-10-CM | POA: Diagnosis not present

## 2022-01-11 ENCOUNTER — Telehealth: Payer: Self-pay | Admitting: Family Medicine

## 2022-01-11 DIAGNOSIS — M25561 Pain in right knee: Secondary | ICD-10-CM | POA: Diagnosis not present

## 2022-01-11 DIAGNOSIS — M1711 Unilateral primary osteoarthritis, right knee: Secondary | ICD-10-CM

## 2022-01-11 DIAGNOSIS — M1712 Unilateral primary osteoarthritis, left knee: Secondary | ICD-10-CM

## 2022-01-11 NOTE — Telephone Encounter (Signed)
Mary from Physical therapy called says pt wants to go to a different PT provider @ some facility (they don't know the nm only the Address ) [place] [id number]. --No phone# provided nor Name of Facility.  ---Forwarding request to med asst for review & new referral to PT (pt's request). --glh

## 2022-01-11 NOTE — Telephone Encounter (Signed)
Spoke with pt regarding PT referral. States he'd like to go to [facility] for B/L knee OA.  Referral faxed. He will call them to schedule an appt.

## 2022-01-12 DIAGNOSIS — M25561 Pain in right knee: Secondary | ICD-10-CM | POA: Diagnosis not present

## 2022-01-14 ENCOUNTER — Other Ambulatory Visit: Payer: Self-pay | Admitting: Family Medicine

## 2022-01-14 DIAGNOSIS — E1140 Type 2 diabetes mellitus with diabetic neuropathy, unspecified: Secondary | ICD-10-CM

## 2022-01-16 DIAGNOSIS — M25561 Pain in right knee: Secondary | ICD-10-CM | POA: Diagnosis not present

## 2022-01-17 ENCOUNTER — Encounter: Payer: Self-pay | Admitting: *Deleted

## 2022-01-17 DIAGNOSIS — M25561 Pain in right knee: Secondary | ICD-10-CM | POA: Diagnosis not present

## 2022-01-18 DIAGNOSIS — M25561 Pain in right knee: Secondary | ICD-10-CM | POA: Diagnosis not present

## 2022-01-19 ENCOUNTER — Other Ambulatory Visit: Payer: Self-pay | Admitting: Family Medicine

## 2022-01-19 DIAGNOSIS — M25561 Pain in right knee: Secondary | ICD-10-CM | POA: Diagnosis not present

## 2022-01-19 DIAGNOSIS — E119 Type 2 diabetes mellitus without complications: Secondary | ICD-10-CM

## 2022-01-23 DIAGNOSIS — M25561 Pain in right knee: Secondary | ICD-10-CM | POA: Diagnosis not present

## 2022-01-24 DIAGNOSIS — M25561 Pain in right knee: Secondary | ICD-10-CM | POA: Diagnosis not present

## 2022-01-25 DIAGNOSIS — M25561 Pain in right knee: Secondary | ICD-10-CM | POA: Diagnosis not present

## 2022-01-26 DIAGNOSIS — M25561 Pain in right knee: Secondary | ICD-10-CM | POA: Diagnosis not present

## 2022-01-30 DIAGNOSIS — M25561 Pain in right knee: Secondary | ICD-10-CM | POA: Diagnosis not present

## 2022-01-31 DIAGNOSIS — M25561 Pain in right knee: Secondary | ICD-10-CM | POA: Diagnosis not present

## 2022-02-01 DIAGNOSIS — M25561 Pain in right knee: Secondary | ICD-10-CM | POA: Diagnosis not present

## 2022-02-02 ENCOUNTER — Other Ambulatory Visit: Payer: Self-pay | Admitting: Cardiology

## 2022-02-02 DIAGNOSIS — M25561 Pain in right knee: Secondary | ICD-10-CM | POA: Diagnosis not present

## 2022-02-06 DIAGNOSIS — M25561 Pain in right knee: Secondary | ICD-10-CM | POA: Diagnosis not present

## 2022-02-07 DIAGNOSIS — M25561 Pain in right knee: Secondary | ICD-10-CM | POA: Diagnosis not present

## 2022-02-08 DIAGNOSIS — M25561 Pain in right knee: Secondary | ICD-10-CM | POA: Diagnosis not present

## 2022-02-09 DIAGNOSIS — M25561 Pain in right knee: Secondary | ICD-10-CM | POA: Diagnosis not present

## 2022-02-13 ENCOUNTER — Telehealth: Payer: Self-pay | Admitting: Sports Medicine

## 2022-02-13 DIAGNOSIS — M25561 Pain in right knee: Secondary | ICD-10-CM | POA: Diagnosis not present

## 2022-02-13 DIAGNOSIS — M25562 Pain in left knee: Secondary | ICD-10-CM | POA: Diagnosis not present

## 2022-02-13 NOTE — Telephone Encounter (Signed)
[facility] rep/ Olivia Mackie cld states pt needs Aqua therapy & they don't do it there--Tracy recommends pt come to [facility] but he wishes to try [facility].  --Forwarding referral request to med asst for review & to [facility] w/ [facility] & advise pt of PT scheduling.

## 2022-02-14 DIAGNOSIS — M25561 Pain in right knee: Secondary | ICD-10-CM | POA: Diagnosis not present

## 2022-02-15 DIAGNOSIS — M25561 Pain in right knee: Secondary | ICD-10-CM | POA: Diagnosis not present

## 2022-02-16 DIAGNOSIS — M25561 Pain in right knee: Secondary | ICD-10-CM | POA: Diagnosis not present

## 2022-02-20 DIAGNOSIS — M25561 Pain in right knee: Secondary | ICD-10-CM | POA: Diagnosis not present

## 2022-02-21 ENCOUNTER — Encounter: Payer: Self-pay | Admitting: Student

## 2022-02-21 ENCOUNTER — Ambulatory Visit: Payer: Medicaid Other | Admitting: Student

## 2022-02-21 VITALS — BP 167/96 | HR 80 | Ht 69.0 in | Wt >= 6400 oz

## 2022-02-21 DIAGNOSIS — Z6841 Body Mass Index (BMI) 40.0 and over, adult: Secondary | ICD-10-CM

## 2022-02-21 DIAGNOSIS — G8929 Other chronic pain: Secondary | ICD-10-CM

## 2022-02-21 DIAGNOSIS — M25562 Pain in left knee: Secondary | ICD-10-CM | POA: Diagnosis not present

## 2022-02-21 DIAGNOSIS — M25561 Pain in right knee: Secondary | ICD-10-CM | POA: Diagnosis not present

## 2022-02-21 DIAGNOSIS — I10 Essential (primary) hypertension: Secondary | ICD-10-CM

## 2022-02-21 DIAGNOSIS — E1140 Type 2 diabetes mellitus with diabetic neuropathy, unspecified: Secondary | ICD-10-CM | POA: Diagnosis not present

## 2022-02-21 LAB — POCT GLYCOSYLATED HEMOGLOBIN (HGB A1C): HbA1c, POC (controlled diabetic range): 5.8 % (ref 0.0–7.0)

## 2022-02-21 MED ORDER — IBUPROFEN 600 MG PO TABS: 600.0000 mg | ORAL_TABLET | Freq: Three times a day (TID) | ORAL | 0 refills | Status: DC | PRN

## 2022-02-21 MED ORDER — TRULICITY 1.5 MG/0.5ML ~~LOC~~ SOAJ: 1.5000 mg | SUBCUTANEOUS | 1 refills | Status: DC

## 2022-02-21 MED ORDER — LIDOCAINE 5 % EX PTCH: 1.0000 | MEDICATED_PATCH | CUTANEOUS | 0 refills | Status: AC

## 2022-02-21 NOTE — Assessment & Plan Note (Addendum)
BP quite elevated in clinic, however, patient and caregiver endorse normotensive blood pressures when they check at home.  Given his history of orthostatic hypotension, I am not eager to increase his regimen at this time.  We will monitor BP at follow-up. -Continue irbesartan and metoprolol

## 2022-02-21 NOTE — Assessment & Plan Note (Addendum)
Weight up slightly from last visit.  - Incr Trulicity to 1.'5mg'$ /week - Encouraged ongoing engagement with phyiscal activity through bowling - Will refer to [place] supervised exercise program - F/u in 6-8 weeks

## 2022-02-21 NOTE — Patient Instructions (Addendum)
[name], Congratulations on your A1c of 5.8 today.  This shows that your medicine is doing its job and that you are making excellent lifestyle interventions.  Continue with making healthier diet choices and I am confident that getting back into bowling will also be good for your diabetes, your blood pressure and your weight management goals.  We are going to make some adjustments to your medications.  You can stop your aspirin and metformin.  I am increasing your dose of Trulicity to 1.5 mg/week.  Continue to take this every Thursday.  I will also follow-up on the referrals to physical therapy and the order for an electric wheelchair.  I am ordering some lidocaine patches that you can place on your knee to help augment your osteoarthritis pain management regimen.  You can use Tylenol and ibuprofen but I would not take either of these around-the-clock or every day, they should be used just on an as-needed basis if you are, say, sore after a bowling match or after a lot of activity around the house or in the community. [name], MD

## 2022-02-21 NOTE — Assessment & Plan Note (Addendum)
Previously referred for water therapy but the program that he was referred to did not accept his insurance.  We will try referring to [facility] where he could have medically supervised exercise as well as aqua therapy for his knees. Also previously ordered for an electric wheelchair but per his report, this order was never followed-up on.  - Lidocaine patch - Continue conservative measures - Weight loss will be key - DME order for electric wheelchair

## 2022-02-21 NOTE — Assessment & Plan Note (Signed)
Well-controlled with A1c 5.8 today. - Increase Trulicity as above - Can D/c metformin

## 2022-02-21 NOTE — Progress Notes (Signed)
SUBJECTIVE:   CHIEF COMPLAINT / HPI:   Weight Loss  Bilateral Knee OA Patient with weight previously in excess of 400lbs. Started Trulicity on 0/9/47. Since then weight is up 14 pounds to 414.  Patient has historically been as heavy as 600 pounds and as low as 310 pounds, he gained a fair amount of weight during the COVID pandemic but is excited to get back into bowling which is a physical activity he enjoys and has been helpful in terms of achieving and maintaining weight loss in the past.  He is joining a disability league.  He was previously referred to [facility] but they did not accept his insurance.  He would like to pursue aquatic therapy for both weight loss and titrate his bilateral knee osteoarthritis.  S9GG Has been on Trulicity for the past 2 months as above, also is on metformin 500 mg.  Was having issues with GI upset on higher dose metformin.  Is tolerating the Trulicity well without GI or biliary side effects.  Does check his sugars and they are generally less than 150.  HTN Has a history of orthostatic type potential.  Currently on irbesartan 150 mg daily and metoprolol succinate 50 mg daily.  Checks his blood pressures at home and they are always less than 836 systolic intraluminally in the 62H diastolic.  He notes that his pressure is quite elevated in clinic today but finds that this is not in line with the pressures that he gets at home.  Confirmed this with his caregiver Craig [name] who assists him in checking his blood pressures.  Advanced Directives Would like his caregiver Craig [name] to be medical decision maker in the event he could not make his own decisions.    OBJECTIVE:   BP (!) 167/96   Pulse 80   Ht '5\' 9"'$  (1.753 m)   Wt (!) 414 lb 6 oz (188 kg)   SpO2 96%   BMI 61.19 kg/m   Physical Exam Vitals reviewed.  Constitutional:      General: He is not in acute distress.    Appearance: He is obese.  Cardiovascular:     Rate and Rhythm: Normal rate and  regular rhythm.     Pulses: Normal pulses.     Heart sounds: Normal heart sounds.  Musculoskeletal:     Right knee: Crepitus present. No deformity or effusion.     Left knee: Crepitus present. No deformity or effusion.      ASSESSMENT/PLAN:   BMI 60.0-69.9, adult (HCC) Weight up slightly from last visit.  - Incr Trulicity to 1.'5mg'$ /week - Encouraged ongoing engagement with phyiscal activity through bowling - Will refer to [place] supervised exercise program - F/u in 6-8 weeks  Type 2 diabetes mellitus with diabetic neuropathy, without long-term current use of insulin ([place]) Well-controlled with A1c 5.8 today. - Increase Trulicity as above - Can D/c metformin  Chronic pain of both knees Previously referred for water therapy but the program that he was referred to did not accept his insurance.  We will try referring to [facility] where he could have medically supervised exercise as well as aqua therapy for his knees. Also previously ordered for an electric wheelchair but per his report, this order was never followed-up on.  - Lidocaine patch - Continue conservative measures - Weight loss will be key - DME order for electric wheelchair  Primary hypertension BP quite elevated in clinic, however, patient and caregiver endorse normotensive blood pressures when they check at home.  Given his history of orthostatic hypotension, I am not eager to increase his regimen at this time.  We will monitor BP at follow-up. -Continue irbesartan and metoprolol     Craig Dubonnet, MD [place]

## 2022-02-22 DIAGNOSIS — M25561 Pain in right knee: Secondary | ICD-10-CM | POA: Diagnosis not present

## 2022-02-23 ENCOUNTER — Telehealth: Payer: Self-pay

## 2022-02-23 DIAGNOSIS — M25561 Pain in right knee: Secondary | ICD-10-CM | POA: Diagnosis not present

## 2022-02-23 NOTE — Telephone Encounter (Signed)
A Prior Authorization was initiated for this patients LIDOCAINE PATCHES through CoverMyMeds.   Key:  [id number]

## 2022-02-24 ENCOUNTER — Telehealth: Payer: Self-pay

## 2022-02-24 NOTE — Telephone Encounter (Signed)
Prior Auth for patients medication LIDOCAINE PATCHES denied by [employer] via CoverMyMeds.   Reason: medical criteria not met  Denial letter scanned to chart  CoverMyMeds Key: [id number]  Informed pt that 4% patches are available OTC. Pt will attempt to get them from [facility] or have his nurse pick them up

## 2022-02-24 NOTE — Telephone Encounter (Signed)
error 

## 2022-03-02 ENCOUNTER — Telehealth: Payer: Self-pay

## 2022-03-02 NOTE — Telephone Encounter (Signed)
Received message from PCP regarding order for electric wheelchair. Referral was placed in March for [facility] to perform wheelchair eval.   Per chart review, it does not appear that patient has scheduled with them.   Attempted to call patient. No answer, LVM asking that patient return call to office to provide him with contact information for [facility] for wheelchair eval.   Talbot Grumbling, RN

## 2022-03-06 DIAGNOSIS — M25561 Pain in right knee: Secondary | ICD-10-CM | POA: Diagnosis not present

## 2022-03-07 DIAGNOSIS — M25561 Pain in right knee: Secondary | ICD-10-CM | POA: Diagnosis not present

## 2022-03-08 DIAGNOSIS — M25561 Pain in right knee: Secondary | ICD-10-CM | POA: Diagnosis not present

## 2022-03-09 DIAGNOSIS — M25561 Pain in right knee: Secondary | ICD-10-CM | POA: Diagnosis not present

## 2022-03-13 DIAGNOSIS — M25561 Pain in right knee: Secondary | ICD-10-CM | POA: Diagnosis not present

## 2022-03-14 DIAGNOSIS — M25561 Pain in right knee: Secondary | ICD-10-CM | POA: Diagnosis not present

## 2022-03-15 DIAGNOSIS — M25561 Pain in right knee: Secondary | ICD-10-CM | POA: Diagnosis not present

## 2022-03-16 ENCOUNTER — Other Ambulatory Visit: Payer: Self-pay | Admitting: Family Medicine

## 2022-03-16 DIAGNOSIS — M25561 Pain in right knee: Secondary | ICD-10-CM | POA: Diagnosis not present

## 2022-03-16 DIAGNOSIS — E1140 Type 2 diabetes mellitus with diabetic neuropathy, unspecified: Secondary | ICD-10-CM

## 2022-03-17 ENCOUNTER — Other Ambulatory Visit: Payer: Self-pay | Admitting: Student

## 2022-03-17 DIAGNOSIS — E1140 Type 2 diabetes mellitus with diabetic neuropathy, unspecified: Secondary | ICD-10-CM

## 2022-03-17 MED ORDER — TRULICITY 1.5 MG/0.5ML ~~LOC~~ SOAJ: 1.5000 mg | SUBCUTANEOUS | 2 refills | Status: DC

## 2022-03-20 DIAGNOSIS — M25561 Pain in right knee: Secondary | ICD-10-CM | POA: Diagnosis not present

## 2022-03-21 DIAGNOSIS — M25561 Pain in right knee: Secondary | ICD-10-CM | POA: Diagnosis not present

## 2022-03-22 DIAGNOSIS — M25561 Pain in right knee: Secondary | ICD-10-CM | POA: Diagnosis not present

## 2022-03-23 DIAGNOSIS — M25561 Pain in right knee: Secondary | ICD-10-CM | POA: Diagnosis not present

## 2022-03-27 DIAGNOSIS — M25561 Pain in right knee: Secondary | ICD-10-CM | POA: Diagnosis not present

## 2022-03-28 DIAGNOSIS — M25561 Pain in right knee: Secondary | ICD-10-CM | POA: Diagnosis not present

## 2022-03-29 DIAGNOSIS — M25561 Pain in right knee: Secondary | ICD-10-CM | POA: Diagnosis not present

## 2022-03-30 DIAGNOSIS — M25561 Pain in right knee: Secondary | ICD-10-CM | POA: Diagnosis not present

## 2022-03-31 ENCOUNTER — Other Ambulatory Visit: Payer: Self-pay | Admitting: Family Medicine

## 2022-03-31 NOTE — Progress Notes (Unsigned)
Cardiology Clinic Note   Patient Name: Craig [name] Date of Encounter: 04/03/2022  Primary Care Provider:  Eppie Gibson, MD Primary Cardiologist:  Donato Heinz, MD  Patient Profile    Craig [name] 61 year old male presents the clinic today for follow-up evaluation of his primary hypertension and hyperlipidemia.  Past Medical History    Past Medical History:  Diagnosis Date   Anemia 12/15/2014   Anxiety and depression 08/17/2017   Bilateral carpal tunnel syndrome 08/03/2016   Cancer ([place]) abdomen   Controlled type 2 diabetes mellitus without complication, without long-term current use of insulin ([place]) 09/17/2012   Well-controlled.  No history of insulin use.   Dizziness 03/27/2012   GERD (gastroesophageal reflux disease) 03/27/2012   History of gastric cancer 02/14/2012   Followed by Dr. Wynetta Emery in [place].    Hyperlipidemia associated with type 2 diabetes mellitus ([place]) 01/01/2013   Morbid obesity with BMI of 50.0-59.9, adult ([place]) 06/05/2012   Onychomycosis 08/27/2018   Orthostatic hypotension 11/10/2019   Perioral numbness 07/30/2018   Poor dentition 12/02/2018   Primary hypertension 04/30/2012   Right hand paresthesia 03/26/2017   Past Surgical History:  Procedure Laterality Date   ABDOMINAL SURGERY      Allergies  No Known Allergies  History of Present Illness    Craig [name] has a PMH of primary hypertension, gastric cancer, GERD, type 2 diabetes, hyperlipidemia, morbid obesity, dizziness, bilateral knee pain, PVCs, anxiety and depression.  He was seen and evaluated by Dr.McIntyre for orthostatic hypotension.  He was seen on 01/07/2020.  His Victoza, gabapentin, valsartan, and carvedilol were discontinued.  He reported having dizziness 3-4 times per week.  The episodes would occur multiple times per day.  He denied syncopal episodes.  He did report occasional palpitations where he felt like his heart was racing.  He denied positional relationship.  His  symptoms improved since stopping the medications.  He was seen by Dr.Schumann 01/25/2021.  He reported that prior to the COVID pandemic he was active mowing 4 times per week and swimming team 3 times per week.  He gained around 8 pounds since the pandemic.  He quit smoking several years ago.  His father had MI at age 70.  His echocardiogram 01/05/2020 showed an LVEF of 55-60%, normal RV function, no significant valvular disease.  He wore a cardiac event monitor for 4 days on 7/21 which showed frequent PVCs he was started on metoprolol XL and was titrated to 50 mg daily.  A follow-up cardiac event monitor 11/08/2020 showed frequent PVCs.  His metoprolol succinate was increased to 50 mg twice daily.  During his 01/25/2021 cardiology visit he was doing better.  He brought in a blood pressure log which showed systolic blood pressures in the 90s-130s over 70s-80s.  He denied chest pain and dyspnea with exertion.  His metoprolol succinate was reduced to 25 mg daily due to lightheadedness.  He denied lightheadedness since the reduced medication.  He denied syncope.  He did note occasional lower extremity swelling.  He has been doing physical therapy and his mobility has improved.  He reported walking daily for about 10 minutes.  He presented to the clinic to 623 for follow-up evaluation stated he felt fairly well.  He continued to notice occasional episodes of dizziness when he was exercising or standing for prolonged periods at times.  He did not notice any dizziness when he was stationary or sedentary.  He was still planning to undergo knee replacement surgery.  He had been told by orthopedics that he needed to lose around 130 pounds.  His blood pressure was well  126/80 range.  He had a home health aide that helped him with daily care 3-4 days/week.  He had been monitoring his diet and staying away from high sodium foods.  He was taking Trulicity.  We reviewed his previous echocardiogram and PVCs.  He continued to  have occasional PVCs.  His EKG showed sinus rhythm with frequent premature ventricular complexes 84 bpm.  We  continued his current medication regimen, gave salty 6 diet sheet, asked him to increase physical activity as tolerated, and planned follow-up for 6 months.  He presents to the clinic today for follow-up evaluation states he has been doing fairly well.  He continues to try to lose weight.  He continues to have left knee pain.  He is now wearing lidocaine patches.  He has been followed by orthopedics who did not recommend cortisone injection at this time.  He denies episodes of accelerated or irregular heartbeat.  He reports that his breathing is also been better.  He has been eating a reduced calorie diet.  He would feels that his palpitations are better controlled with his metoprolol 50 mg twice daily.  We reviewed the importance of continued heart healthy diet and increase physical activity.  His EKG today shows normal sinus rhythm 77 bpm.  I will order a BMP today, continue his current medication regimen, and plan follow-up for 6 to 9 months.  Today he denies chest pain, shortness of breath, lower extremity edema, fatigue, palpitations, melena, hematuria, hemoptysis, diaphoresis, weakness, presyncope, syncope, orthopnea, and PND.   Home Medications    Prior to Admission medications   Medication Sig Start Date End Date Taking? Authorizing Provider  ACCU-CHEK GUIDE test strip USE AS INSTRUCTED 09/14/21   Autry-Lott, Naaman Plummer, DO  antiseptic oral rinse (BIOTENE) LIQD 15 mLs by Mouth Rinse route as needed for dry mouth.    [provider]  aspirin (ASPIRIN LOW DOSE) 81 MG EC tablet TAKE 1 TABLET BY MOUTH EVERY DAY 04/07/21   Zenia Resides, MD  atorvastatin (LIPITOR) 80 MG tablet Take 1 tablet (80 mg total) by mouth daily. 04/07/21   Zenia Resides, MD  Blood Glucose Monitoring Suppl (ACCU-CHEK GUIDE) w/Device KIT Inject 1 kit into the skin daily. 07/28/20   Autry-Lott, Naaman Plummer, DO   diclofenac Sodium (VOLTAREN) 1 % GEL Apply 2 g topically 4 (four) times daily. 04/07/21   Zenia Resides, MD  Dulaglutide (TRULICITY) 6.64 QI/3.4VQ SOPN Inject 0.75 mg into the skin once a week. 06/27/21   Autry-Lott, Naaman Plummer, DO  gabapentin (NEURONTIN) 300 MG capsule TAKE 1 CAPSULE BY MOUTH THREE TIMES A DAY 09/14/21   Autry-Lott, Naaman Plummer, DO  irbesartan (AVAPRO) 150 MG tablet TAKE 1 TABLET BY MOUTH EVERYDAY AT BEDTIME 03/21/21   Autry-Lott, Naaman Plummer, DO  Lancets (ACCU-CHEK MULTICLIX) lancets Use as instructed 11/30/20   Lyndee Hensen, DO  metFORMIN (GLUCOPHAGE-XR) 500 MG 24 hr tablet TAKE 1 TABLET BY MOUTH TWICE A DAY 07/25/21   Autry-Lott, Naaman Plummer, DO  metoprolol succinate (TOPROL XL) 50 MG 24 hr tablet Take 1 tablet (50 mg total) by mouth 2 (two) times daily. Patient taking differently: Take 50 mg by mouth 2 (two) times daily. 1/2 tablet at night if dizziness present 11/17/20   Donato Heinz, MD  pantoprazole (PROTONIX) 40 MG tablet TAKE 1 TABLET BY MOUTH EVERY DAY 07/25/21   Autry-Lott, Naaman Plummer, DO  terbinafine (LAMISIL AT)  1 % cream Apply 1 application topically 2 (two) times daily. Patient taking differently: Apply 1 application topically daily. 06/12/19   Autry-Lott, Naaman Plummer, DO    Family History    Family History  Problem Relation Age of Onset   Diabetes type II Mother    He indicated that the status of his mother is unknown.  Social History    Social History   Socioeconomic History   Marital status: Single    Spouse name: Not on file   Number of children: Not on file   Years of education: Not on file   Highest education level: Not on file  Occupational History   Not on file  Tobacco Use   Smoking status: Former    Types: Cigarettes    Start date: 08/14/1977    Quit date: 08/14/1996    Years since quitting: 25.6   Smokeless tobacco: Never  Vaping Use   Vaping Use: Never used  Substance and Sexual Activity   Alcohol use: No   Drug use: Yes    Types: Cocaine,  Marijuana, Heroin    Comment: Previous Hx of substance abuse   Sexual activity: Yes  Other Topics Concern   Not on file  Social History Narrative   Not on file   Social Determinants of Health   Financial Resource Strain: Not on file  Food Insecurity: Not on file  Transportation Needs: Not on file  Physical Activity: Not on file  Stress: Not on file  Social Connections: Not on file  Intimate Partner Violence: Not on file     Review of Systems    General:  No chills, fever, night sweats or weight changes.  Cardiovascular:  No chest pain, dyspnea on exertion, edema, orthopnea, palpitations, paroxysmal nocturnal dyspnea. Dermatological: No rash, lesions/masses Respiratory: No cough, dyspnea Urologic: No hematuria, dysuria Abdominal:   No nausea, vomiting, diarrhea, bright red blood per rectum, melena, or hematemesis Neurologic:  No visual changes, wkns, changes in mental status. All other systems reviewed and are otherwise negative except as noted above.  Physical Exam    VS:  BP 132/85   Pulse 77   Ht '5\' 9"'  (1.753 m)   Wt (!) 407 lb 12.8 oz (185 kg)   SpO2 97%   BMI 60.22 kg/m  , BMI Body mass index is 60.22 kg/m. GEN: Well nourished, well developed, in no acute distress. HEENT: normal. Neck: Supple, no JVD, carotid bruits, or masses. Cardiac: RRR, no murmurs, rubs, or gallops. No clubbing, cyanosis, edema.  Radials/DP/PT 2+ and equal bilaterally.  Respiratory:  Respirations regular and unlabored, clear to auscultation bilaterally. GI: Soft, nontender, nondistended, BS + x 4. MS: no deformity or atrophy. Skin: warm and dry, no rash. Neuro:  Strength and sensation are intact. Psych: Normal affect.  Accessory Clinical Findings    Recent Labs: 07/04/2021: ALT 21; BUN 9; Creatinine, Ser 1.07; Platelets 305.0; Potassium 3.6; Sodium 138 10/05/2021: Hemoglobin 11.1   Recent Lipid Panel    Component Value Date/Time   CHOL 142 02/01/2021 1438   TRIG 102 02/01/2021 1438    HDL 33 (L) 02/01/2021 1438   CHOLHDL 4.3 02/01/2021 1438   CHOLHDL 4.3 06/16/2015 0921   VLDL 13 06/16/2015 0921   LDLCALC 90 02/01/2021 1438   LDLDIRECT 90 07/13/2017 1606   LDLDIRECT 88 02/04/2016 1115    ECG personally reviewed by me today-normal sinus rhythm no ST or T wave deviation 77 bpm  EKG 09/19/2021 sinus rhythm with frequent premature ventricular complexes left  atrial enlargement 84 bpm  Echocardiogram 01/05/2020 IMPRESSIONS     1. Left ventricular ejection fraction, by estimation, is 55 to 60%. The  left ventricle has normal function. The left ventricle has no regional  wall motion abnormalities. Left ventricular diastolic parameters were  normal.   2. Right ventricular systolic function is normal. The right ventricular  size is normal. Tricuspid regurgitation signal is inadequate for assessing  PA pressure.   3. The mitral valve is grossly normal. No evidence of mitral valve  regurgitation. No evidence of mitral stenosis.   4. The aortic valve is tricuspid. Aortic valve regurgitation is not  visualized. Mild to moderate aortic valve sclerosis/calcification is  present, without any evidence of aortic stenosis.   5. The inferior vena cava is normal in size with greater than 50%  respiratory variability, suggesting right atrial pressure of 3 mmHg.  Assessment & Plan   1.  PVCs-stable.  Denies increased fatigue, lightheadedness, presyncope or syncope.  He continues to tolerate medication well and notes reduced palpitations. Continue metoprolol Heart healthy low-sodium diet Increase physical activity as tolerated Avoid triggers caffeine, chocolate, EtOH, dehydration, etc.-reviewed  Lightheadedness-none today.  No recent episodes.   Maintain p.o. hydration Continue weight loss Increase physical activity as tolerated  Essential hypertension-BP today 132/85.  Well-controlled at home. Continue metoprolol, irbesartan Heart healthy low-sodium diet-salty 6  given Increase physical activity as tolerated Maintain blood pressure log  DOE-stable.  Previously offered nuclear stress test.  Patient declined.  Hopeful that he will be able to lose recommended amount of weight for knee replacement. Heart healthy low-sodium diet Increase physical activity as tolerated  Morbid obesity-weight today 407.8.  Reports increased physical activity and more strict diet. Continue weight loss Heart healthy low-sodium diet-salty 6 given Increase physical activity as tolerated  Follow-up with Dr.Schumann or me in 6-9 months.   Jossie Ng. Miaisabella Bacorn NP-C    04/03/2022, [place] [address] Office [phone] Fax [phone]  Notice: This dictation was prepared with Dragon dictation along with smaller phrase technology. Any transcriptional errors that result from this process are unintentional and may not be corrected upon review.  I spent 14 minutes examining this patient, reviewing medications, and using patient centered shared decision making involving her cardiac care.  Prior to her visit I spent greater than 20 minutes reviewing her past medical history,  medications, and prior cardiac tests.

## 2022-04-03 ENCOUNTER — Ambulatory Visit: Payer: Medicaid Other | Admitting: General Practice

## 2022-04-03 ENCOUNTER — Encounter: Payer: Self-pay | Admitting: General Practice

## 2022-04-03 VITALS — BP 132/85 | HR 77 | Ht 69.0 in | Wt >= 6400 oz

## 2022-04-03 DIAGNOSIS — I493 Ventricular premature depolarization: Secondary | ICD-10-CM

## 2022-04-03 DIAGNOSIS — R42 Dizziness and giddiness: Secondary | ICD-10-CM | POA: Diagnosis not present

## 2022-04-03 DIAGNOSIS — R0609 Other forms of dyspnea: Secondary | ICD-10-CM

## 2022-04-03 DIAGNOSIS — I10 Essential (primary) hypertension: Secondary | ICD-10-CM | POA: Diagnosis not present

## 2022-04-03 DIAGNOSIS — M25561 Pain in right knee: Secondary | ICD-10-CM | POA: Diagnosis not present

## 2022-04-03 LAB — BASIC METABOLIC PANEL
BUN/Creatinine Ratio: 13 (ref 9–20)
BUN: 14 mg/dL (ref 6–24)
CO2: 28 mmol/L (ref 20–29)
Calcium: 9.2 mg/dL (ref 8.7–10.2)
Chloride: 99 mmol/L (ref 96–106)
Creatinine, Ser: 1.04 mg/dL (ref 0.76–1.27)
Glucose: 90 mg/dL (ref 70–99)
Potassium: 4.3 mmol/L (ref 3.5–5.2)
Sodium: 138 mmol/L (ref 134–144)
eGFR: 83 mL/min/{1.73_m2} (ref >59)

## 2022-04-03 NOTE — Patient Instructions (Signed)
Medication Instructions:  The current medical regimen is effective;  continue present plan and medications as directed. Please refer to the Current Medication list given to you today.   *If you need a refill on your cardiac medications before your next appointment, please call your pharmacy*  Lab Work:   Testing/Procedures:  BMET TODAY   NONE If you have labs (blood work) drawn today and your tests are completely normal, you will receive your results only by:  1-MyChart Message (if you have MyChart) OR  2-A paper copy in the mail.  If you have any lab test that is abnormal or we need to change your treatment, we will call you to review the results.  Special Instructions NONE  Follow-Up: Your next appointment:  6-9 month(s) In Person with Donato Heinz, MD     Please call our office 2 months in advance to schedule this appointment   At [facility], you and your health needs are our priority.  As part of our continuing mission to provide you with exceptional heart care, we have created designated Provider Care Teams.  These Care Teams include your primary Cardiologist (physician) and Advanced Practice Providers (APPs -  Physician Assistants and Nurse Practitioners) who all work together to provide you with the care you need, when you need it.  Important Information About Sugar

## 2022-04-04 DIAGNOSIS — M25561 Pain in right knee: Secondary | ICD-10-CM | POA: Diagnosis not present

## 2022-04-05 DIAGNOSIS — M25561 Pain in right knee: Secondary | ICD-10-CM | POA: Diagnosis not present

## 2022-04-06 DIAGNOSIS — M25561 Pain in right knee: Secondary | ICD-10-CM | POA: Diagnosis not present

## 2022-04-10 DIAGNOSIS — M25561 Pain in right knee: Secondary | ICD-10-CM | POA: Diagnosis not present

## 2022-04-11 DIAGNOSIS — M25561 Pain in right knee: Secondary | ICD-10-CM | POA: Diagnosis not present

## 2022-04-12 ENCOUNTER — Other Ambulatory Visit: Payer: Self-pay | Admitting: Family Medicine

## 2022-04-12 DIAGNOSIS — M25561 Pain in right knee: Secondary | ICD-10-CM | POA: Diagnosis not present

## 2022-04-12 DIAGNOSIS — E1140 Type 2 diabetes mellitus with diabetic neuropathy, unspecified: Secondary | ICD-10-CM

## 2022-04-13 DIAGNOSIS — M25561 Pain in right knee: Secondary | ICD-10-CM | POA: Diagnosis not present

## 2022-04-14 ENCOUNTER — Other Ambulatory Visit: Payer: Self-pay | Admitting: Student

## 2022-04-14 DIAGNOSIS — E1140 Type 2 diabetes mellitus with diabetic neuropathy, unspecified: Secondary | ICD-10-CM

## 2022-04-14 MED ORDER — TRULICITY 1.5 MG/0.5ML ~~LOC~~ SOAJ: 1.5000 mg | SUBCUTANEOUS | 2 refills | Status: DC

## 2022-04-14 MED ORDER — GABAPENTIN 300 MG PO CAPS: ORAL_CAPSULE | ORAL | 1 refills | Status: DC

## 2022-04-26 DIAGNOSIS — M25561 Pain in right knee: Secondary | ICD-10-CM | POA: Diagnosis not present

## 2022-04-27 DIAGNOSIS — M25561 Pain in right knee: Secondary | ICD-10-CM | POA: Diagnosis not present

## 2022-05-01 ENCOUNTER — Other Ambulatory Visit: Payer: Self-pay | Admitting: Student

## 2022-05-01 DIAGNOSIS — Z85028 Personal history of other malignant neoplasm of stomach: Secondary | ICD-10-CM

## 2022-05-01 DIAGNOSIS — M25561 Pain in right knee: Secondary | ICD-10-CM | POA: Diagnosis not present

## 2022-05-01 DIAGNOSIS — G8929 Other chronic pain: Secondary | ICD-10-CM

## 2022-05-02 DIAGNOSIS — M25561 Pain in right knee: Secondary | ICD-10-CM | POA: Diagnosis not present

## 2022-05-03 ENCOUNTER — Telehealth: Payer: Self-pay | Admitting: Cardiology

## 2022-05-03 DIAGNOSIS — M25561 Pain in right knee: Secondary | ICD-10-CM | POA: Diagnosis not present

## 2022-05-03 NOTE — Telephone Encounter (Signed)
Patient is calling to inform Dr. Gardiner Rhyme that he is having his assessment for an electric wheelchair on 10/04.

## 2022-05-03 NOTE — Telephone Encounter (Signed)
Noted--routed to MD to make aware.

## 2022-05-04 ENCOUNTER — Other Ambulatory Visit: Payer: Self-pay | Admitting: Family Medicine

## 2022-05-04 ENCOUNTER — Ambulatory Visit: Payer: Medicaid Other | Admitting: Student

## 2022-05-04 DIAGNOSIS — E1140 Type 2 diabetes mellitus with diabetic neuropathy, unspecified: Secondary | ICD-10-CM

## 2022-05-04 DIAGNOSIS — I10 Essential (primary) hypertension: Secondary | ICD-10-CM

## 2022-05-08 DIAGNOSIS — M25561 Pain in right knee: Secondary | ICD-10-CM | POA: Diagnosis not present

## 2022-05-09 DIAGNOSIS — M25561 Pain in right knee: Secondary | ICD-10-CM | POA: Diagnosis not present

## 2022-05-10 DIAGNOSIS — M25561 Pain in right knee: Secondary | ICD-10-CM | POA: Diagnosis not present

## 2022-05-11 ENCOUNTER — Ambulatory Visit (INDEPENDENT_AMBULATORY_CARE_PROVIDER_SITE_OTHER): Payer: Medicaid Other | Admitting: Student

## 2022-05-11 ENCOUNTER — Encounter: Payer: Self-pay | Admitting: Student

## 2022-05-11 VITALS — BP 132/86 | HR 72 | Ht 69.0 in | Wt >= 6400 oz

## 2022-05-11 DIAGNOSIS — G8929 Other chronic pain: Secondary | ICD-10-CM | POA: Diagnosis not present

## 2022-05-11 DIAGNOSIS — I10 Essential (primary) hypertension: Secondary | ICD-10-CM

## 2022-05-11 DIAGNOSIS — B353 Tinea pedis: Secondary | ICD-10-CM

## 2022-05-11 DIAGNOSIS — Z6841 Body Mass Index (BMI) 40.0 and over, adult: Secondary | ICD-10-CM | POA: Diagnosis not present

## 2022-05-11 DIAGNOSIS — M25562 Pain in left knee: Secondary | ICD-10-CM | POA: Diagnosis not present

## 2022-05-11 DIAGNOSIS — E1140 Type 2 diabetes mellitus with diabetic neuropathy, unspecified: Secondary | ICD-10-CM

## 2022-05-11 DIAGNOSIS — Z23 Encounter for immunization: Secondary | ICD-10-CM

## 2022-05-11 DIAGNOSIS — M25561 Pain in right knee: Secondary | ICD-10-CM | POA: Diagnosis not present

## 2022-05-11 MED ORDER — TRULICITY 3 MG/0.5ML ~~LOC~~ SOAJ: 3.0000 mg | SUBCUTANEOUS | 1 refills | Status: DC

## 2022-05-11 MED ORDER — TERBINAFINE HCL 1 % EX CREA: 1.0000 | TOPICAL_CREAM | Freq: Two times a day (BID) | CUTANEOUS | 1 refills | Status: DC

## 2022-05-11 NOTE — Assessment & Plan Note (Addendum)
Well-controlled. Increased Trulicity to '3mg'$  weekly. Watched patient use auto-injector trainer correctly in the room. No concerns about medication administration or needle length. Continue to monitor. Urine microalbumin today. Return in 49mofor A1c check.

## 2022-05-11 NOTE — Assessment & Plan Note (Signed)
Continue to work on [employer] and exercise. Will follow-up on referral to [employer].

## 2022-05-11 NOTE — Assessment & Plan Note (Addendum)
Stable on Lidocaine patches. Continue to work on diet and exercise. Patient to follow-up on 10/4 for wheelchair evaluation per PT. Suspect that increasing his physical activity and weight loss will be key in managing the symptoms.  He is well on his way with just about 200 pounds loss to date.  I am cautiously optimistic that getting him into a wheelchair will actually help to expand his wound and get him to more activities.  The caution is in making sure that he does not become reliant on the wheelchair at the expense of physical activity.

## 2022-05-11 NOTE — Progress Notes (Addendum)
Date of Visit: 05/11/2022   HPI:  Craig [name] is a 61 y.o. here for f/u:  T2DM Patient is taking Trulicity 1.'5mg'$  weekly. He has no GI side effects. He is concerned that one of his Trulicity pen needle was not long enough as he had one incident where he did not feel the needle go into his belly. He administered the 0.'75mg'$  injection after this 1.'5mg'$  injection concerned him. His fasting sugars range from 88-140s. His lowest was 65 and he drank some juice after.   HTN He is taking Irbesartan '150mg'$  and Metoprolol '50mg'$ . Home blood pressures are in the 130s/80s.  His home caregiver checks his blood pressure regularly at home and reports that BPs at home have all been within normal limits.  He denies chest pain or dizziness.  Knee Pain Patient is using Lidocaine patch as needed. The pain is manageable. He is actively working on weight loss.  He is hoping to get back into bowling now that his caregiver is back from maternity leave.  He is having an assessment for a wheelchair on 10/4.  He is hopeful that getting a wheelchair will actually open up more physical activity for him including getting into the pool at the [place] [facility].  Weight His caregiver [name] was on maternity leave and he was unable to be as motivated to make dietary and physical activity changes. [name] has since returned. He has the [employer] machine at home and exercises every other day. His diet consists of two meal replacement drinks for breakfast and lunch, and a meal at dinnertime. He also is doing intermittent fasting. He rarely drinks sodas. He drinks water and cranberry juice.  Tinea Longstanding issue.  Requesting refill of his terbinafine which works well for him.  PHYSICAL EXAM: BP 132/86   Pulse 72   Ht '5\' 9"'$  (1.753 m)   Wt (!) 414 lb (187.8 kg)   SpO2 93%   BMI 61.14 kg/m  Gen: Well appearing in no distress, obese Pulm: Normal work of breathing. No wheezing or crackles. CV: Normal rate. No murmurs or  rubs. Normal S1 and S2. Neuro: Alert and oriented. Ext: Compression stockings in place. Bilateral knees with joint line tenderness, challenging to assess for effusion or bony abnormalities secondary to body habitus. ROM intact with crepitus R>L Bilateral upper extremities with normal strength and FROM.  Skin: Hyperpigmented circular rash on knee consistent with tinea Neuro: Unsteady gait, assisted by cane.   ASSESSMENT/PLAN: Primary hypertension Blood pressure is <140/90 and is at goal. Continue Irbesartan and Metoprolol. Continue to monitor Bps.  Type 2 diabetes mellitus with diabetic neuropathy, without long-term current use of insulin ([place]) Well-controlled. Increased Trulicity to '3mg'$  weekly. Watched patient use auto-injector trainer correctly in the room. No concerns about medication administration or needle length. Continue to monitor. Urine microalbumin today. Return in 3mofor A1c check.   Chronic pain of both knees Stable on Lidocaine patches. Continue to work on diet and exercise. Patient to follow-up on 10/4 for wheelchair evaluation per PT. Suspect that increasing his physical activity and weight loss will be key in managing the symptoms.  He is well on his way with just about 200 pounds loss to date.  I am cautiously optimistic that getting him into a wheelchair will actually help to expand his wound and get him to more activities.  The caution is in making sure that he does not become reliant on the wheelchair at the expense of physical activity.  BMI 60.0-69.9,  adult [employer] Continue to work on healthy diet and exercise. Will follow-up on referral to [employer].   Tinea Terbinafine refilled.   Leonette Nutting, [facility]    I have evaluated this patient along with Student Dr. Bridgett Larsson and reviewed the above note, making necessary revisions.  [name], MD 05/11/2022, 5:15 PM PGY-2, [address]

## 2022-05-11 NOTE — Patient Instructions (Signed)
Craig [name],  It is so great to see you today.  I am glad you are overall feeling well.  I will follow-up on the referral to the [employer] valve supervised exercise program for you.  Please be in touch and let me know if there is anything else that I need to do for your for your wheelchair assessment.  I will be sure to document your need in my note for today.  I am increasing your Trulicity dose to 3 mg/week.  With such a big dose increase, you may notice that you have some nausea for the first week or so with the new dose.  I am hopeful this will help you turn the corner and get back to losing weight.  I am also very excited that you are going to be getting back into your bowling very soon.  I think this is one of the best things you can do for your physical, mental, emotional health.  I will see you back in mid November, in the meantime if you need anything, please give [place] a call.   [name], MD

## 2022-05-11 NOTE — Assessment & Plan Note (Signed)
Blood pressure is <140/90 and is at goal. Continue Irbesartan and Metoprolol. Continue to monitor Bps.

## 2022-05-12 LAB — MICROALBUMIN / CREATININE URINE RATIO
Creatinine, Urine: 178.4 mg/dL
Microalb/Creat Ratio: 6 mg/g creat (ref 0–29)
Microalbumin, Urine: 11 ug/mL

## 2022-05-15 DIAGNOSIS — M25561 Pain in right knee: Secondary | ICD-10-CM | POA: Diagnosis not present

## 2022-05-16 DIAGNOSIS — M25561 Pain in right knee: Secondary | ICD-10-CM | POA: Diagnosis not present

## 2022-05-17 ENCOUNTER — Other Ambulatory Visit: Payer: Self-pay

## 2022-05-17 ENCOUNTER — Ambulatory Visit: Payer: Medicaid Other | Attending: Family Medicine

## 2022-05-17 DIAGNOSIS — G8929 Other chronic pain: Secondary | ICD-10-CM | POA: Insufficient documentation

## 2022-05-17 DIAGNOSIS — M25562 Pain in left knee: Secondary | ICD-10-CM | POA: Insufficient documentation

## 2022-05-17 DIAGNOSIS — M25561 Pain in right knee: Secondary | ICD-10-CM | POA: Diagnosis not present

## 2022-05-17 DIAGNOSIS — R2689 Other abnormalities of gait and mobility: Secondary | ICD-10-CM | POA: Diagnosis present

## 2022-05-17 NOTE — Therapy (Signed)
OUTPATIENT PHYSICAL THERAPY WHEELCHAIR EVALUATION   Patient Name: Craig [name] MRN: [id number] DOB:December 03, 1960, 61 y.o., male Today's Date: 05/17/2022   PT End of Session - 05/17/22 1255     Visit Number 1    Number of Visits 1    PT Start Time 5929    PT Stop Time 1230    PT Time Calculation (min) 45 min    Activity Tolerance Patient tolerated treatment well    Behavior During Therapy [facility] for tasks assessed/performed             Past Medical History:  Diagnosis Date   Anemia 12/15/2014   Anxiety and depression 08/17/2017   Bilateral carpal tunnel syndrome 08/03/2016   Cancer ([place]) abdomen   Controlled type 2 diabetes mellitus without complication, without long-term current use of insulin ([place]) 09/17/2012   Well-controlled.  No history of insulin use.   Dizziness 03/27/2012   GERD (gastroesophageal reflux disease) 03/27/2012   History of gastric cancer 02/14/2012   Followed by Dr. Wynetta Emery in [place].    Hyperlipidemia associated with type 2 diabetes mellitus ([place]) 01/01/2013   Morbid obesity with BMI of 50.0-59.9, adult ([place]) 06/05/2012   Onychomycosis 08/27/2018   Orthostatic hypotension 11/10/2019   Perioral numbness 07/30/2018   Poor dentition 12/02/2018   Primary hypertension 04/30/2012   Right hand paresthesia 03/26/2017   Past Surgical History:  Procedure Laterality Date   ABDOMINAL SURGERY     Patient Active Problem List   Diagnosis Date Noted   Medication refill 09/13/2020   Orthostatic hypotension 11/10/2019   Poor dentition 12/02/2018   Onychomycosis 08/27/2018   Perioral numbness 07/30/2018   Anxiety and depression 08/17/2017   Right hand paresthesia 03/26/2017   Bilateral carpal tunnel syndrome 08/03/2016   Anemia 12/15/2014   Hyperlipidemia associated with type 2 diabetes mellitus ([place]) 01/01/2013   Type 2 diabetes mellitus with diabetic neuropathy, without long-term current use of insulin ([place]) 09/17/2012   Chronic pain of both knees 09/17/2012   BMI  60.0-69.9, adult ([place]) 06/05/2012   Primary hypertension 04/30/2012   GERD (gastroesophageal reflux disease) 03/27/2012   Dizziness 03/27/2012   History of gastric cancer 02/14/2012    PCP: Dr. Jim Like  REFERRING PROVIDER: Lind Covert, MD  THERAPY DIAG:  Chronic pain of right knee  Chronic pain of left knee  Other abnormalities of gait and mobility  Rationale for Evaluation and Treatment Rehabilitation  SUBJECTIVE:  SUBJECTIVE STATEMENT: Pt present or wheelchair evaluation. Pt reports that he used to weight 680 lbs and he is currently weighting 414 lbs. He is trying to loose weight to <300 lbs so he can have knee surgeries. He is ursing [facility] for mobility but finds it difficult to walk longer distances. He likes to participate in aquatic exercises but it is difficult for him to walk from car to pool due to prolonged walking and back to car after exercises due to increased knee pain. Pt wants power wheelchair for ease of community access.  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  PATIENT GOALS Obtain mobility device for community ambulation         MEDICAL HISTORY:  Primary diagnosis onset:  Diagnosis  Code:  Diagnosis:    Diagnosis code:       Diagnosis:  M25.561,M25.562,G89.29 (ICD-10-CM) - Chronic pain of both knees Diagnosis  Code:  Diagnosis:   '[x]'$ Progressive disease  Relevant future surgeries:     Height: 5'10" Weight:  417 lbs Explain recent changes or trends in weight:      History:  Past  Medical History:  Diagnosis Date   Anemia 12/15/2014   Anxiety and depression 08/17/2017   Bilateral carpal tunnel syndrome 08/03/2016   Cancer ([place]) abdomen   Controlled type 2 diabetes mellitus without complication, without long-term current use of insulin ([place]) 09/17/2012   Well-controlled.  No history of insulin use.   Dizziness 03/27/2012   GERD (gastroesophageal reflux disease) 03/27/2012   History of gastric cancer 02/14/2012   Followed by Dr. Wynetta Emery in [place].    Hyperlipidemia associated with type 2 diabetes mellitus ([place]) 01/01/2013   Morbid obesity with BMI of 50.0-59.9, adult ([place]) 06/05/2012   Onychomycosis 08/27/2018   Orthostatic hypotension 11/10/2019   Perioral numbness 07/30/2018   Poor dentition 12/02/2018   Primary hypertension 04/30/2012   Right hand paresthesia 03/26/2017            Cardio Status: HTN Functional Limitations:   '[]'$ Intact  '[x]'$  Impaired      Respiratory Status:  Functional Limitations:   '[]'$ Intact  '[x]'$ Impaired   '[x]'$ SOB '[]'$ COPD '[]'$ O2 Dependent ______LPM  '[]'$ Ventilator Dependent  Resp equip:                                                     Objective Measure(s):   Orthotics:   '[]'$ Amputee:                                                             '[]'$ Prosthesis:        HOME ENVIRONMENT:  '[x]'$ House '[]'$ Condo/town home '[]'$ Apartment '[]'$ Asst living '[]'$ LTCF         '[x]'$ Own  '[]'$ Rent   '[x]'$ Lives alone '[x]'$ Lives with others -    caregiver, 3.5 hours, 4 days/week                        Hours without assistance:  20  '[x]'$ Home is accessible to patient                                 Storage of wheelchair:  '[x]'$ In home   '[]'$ Other Comments:        COMMUNITY :  TRANSPORTATION:  '[]'$ Car '[]'$ Van '[x]'$ Public Transportation '[]'$ Adapted w/c Lift '[]'$  Ambulance '[]'$ Other:                     '[x]'$ Sits in wheelchair during transport   Where is w/c stored during transport? 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, works from home, prolonged sitting     Other:  COMMUNICATION:  Verbal Communication  '[x]'$ WFL '[]'$ receptive '[]'$ WFL '[]'$ expressive '[]'$ Understandable  '[]'$ Difficult to understand  '[]'$ non-communicative  Primary Language:_____English_________ 2nd:_____________  Communication provided by:'[x]'$ Patient '[]'$ Family '[]'$ Caregiver '[]'$ Translator   '[]'$ Uses an Veterinary surgeon device     Manufacturer/Model :                                                                MOBILITY/BALANCE:  Sitting Balance  Standing Balance  Transfers  Ambulation   '[x]'$ WFL      '[]'$ WFL  '[x]'$ Independent  '[]'$  Independent   '[]'$ Uses UE for balance in sitting Comments:  '[x]'$ Uses UE/device for stability Comments:  '[]'$  Min assist  '[x]'$  Ambulates independently with       device:_St. cane__________________      '[]'$  Mod assist  '[x]'$  Able to ambulate ___<100___ feet        safely/functionally/independently   '[]'$  Min assist  '[]'$  Min assist  '[]'$  Max assist  '[]'$  Non-functional ambulator         History/High risk of falls   '[]'$  Mod assist  '[]'$  Mod assist  '[]'$  Dependent  '[]'$  Unable to ambulate   '[]'$  Max  assist  '[]'$  Max assist  Transfer method:'[]'$  1 person '[]'$  2 person '[]'$  sliding board '[]'$  squat pivot '[x]'$  stand pivot '[]'$  mechanical patient lift  '[]'$  other:   '[]'$  Unable  '[]'$  Unable    Fall History: # of falls in the past 6 months? 3 # of "near" falls in the past 6 months? ~1 per day    CURRENT SEATING / MOBILITY:  Current Mobility Device: '[x]'$ None '[x]'$ Cane/Walker '[]'$ Manual '[]'$ Dependent '[]'$ Dependent w/ Tilt rScooter '[]'$ Power (type of control):   Manufacturer:  Model:  Serial #:   Size:  Color:  Age:   Purchased by whom:   Current condition of mobility base:    Current seating system:                                                                       Age of seating system:    Describe posture in present seating system:    Is the current mobility  meeting medical necessity?:  '[]'$ Yes '[x]'$ No  Describe: difficulty with prolonged walking necessary for community access.                                    Ability to complete Mobility-Related Activities of Daily Living (MRADL's) with Current Mobility Device:   Move room to room  '[x]'$ Independent  '[]'$ Min '[]'$ Mod '[]'$ Max assist  '[]'$ Unable  Comments: has nursing aide that comes over to assist with house cleaning, meal prep, bathing  Meal prep  '[]'$ Independent  '[x]'$ Min '[]'$ Mod '[]'$ Max assist  '[]'$ Unable    Feeding  '[x]'$ Independent  '[]'$ Min '[]'$ Mod '[]'$ Max assist  '[]'$ Unable    Bathing  '[]'$ Independent  '[x]'$ Min '[]'$ Mod '[]'$ Max assist  '[]'$ Unable    Grooming  '[x]'$ Independent  '[]'$ Min '[]'$ Mod '[]'$ Max assist  '[]'$ Unable    UE dressing  '[x]'$ Independent  '[]'$ Min '[]'$ Mod '[]'$ Max assist  '[]'$ Unable    LE dressing  '[x]'$ Independent   '[]'$ Min '[]'$ Mod '[]'$ Max assist  '[]'$ Unable    Toileting  '[x]'$ Independent  '[]'$ Min '[]'$ Mod '[]'$ Max assist  '[]'$ Unable    Bowel Mgt: '[x]'$  Continent '[]'$  Incontinent '[]'$  Accidents '[]'$  Diapers '[]'$  Colostomy '[]'$  Bowel Program:  Bladder Mgt: '[x]'$  Continent '[]'$  Incontinent '[]'$  Accidents '[]'$  Diapers '[]'$  Urinal '[]'$  Intermittent Cath '[]'$  Indwelling Cath '[]'$  Supra-pubic Cath     Current Mobility Equipment Trialed/ Ruled Out:    Does not meet mobility needs due to:    Mark all boxes that indicate inability to use the specific equipment listed     Meets needs for safe  independent functional  ambulation  / mobility    Risk of  Falling or History of Falls    Enviromental limitations      Cognition    Safety concerns with  physical ability    Decreased / limitations endurance  & strength     Decreased / limitations  motor skills  & coordination    Pain    Pace /  Speed    Cardiac and/or  respiratory condition    Contra - indicated by diagnosis   Cane/Crutches  '[]'$   '[x]'$   '[]'$   '[]'$   '[x]'$   '[x]'$   '[]'$   '[x]'$   '[x]'$   '[x]'$   '[]'$    Walker / Rollator  '[]'$  NA   '[]'$   '[x]'$   '[]'$   '[]'$   '[x]'$   '[x]'$   '[]'$   '[x]'$   '[x]'$   '[x]'$   '[]'$     Manual Wheelchair [id number]:  '[x]'$  NA  '[]'$   '[]'$   '[]'$   '[]'$   '[]'$   '[]'$   '[]'$   '[]'$   '[]'$   '[]'$   '[]'$    Manual W/C (K0005) with  power assist  '[x]'$  NA  '[]'$   '[]'$   '[]'$   '[]'$   '[]'$   '[]'$   '[]'$   '[]'$   '[]'$   '[]'$   '[]'$    Scooter  '[x]'$  NA  '[]'$   '[]'$   '[]'$   '[]'$   '[]'$   '[]'$   '[]'$   '[]'$   '[]'$   '[]'$   '[]'$    Power Wheelchair: standard joystick  '[]'$  NA  '[x]'$   '[]'$   '[]'$   '[]'$   '[]'$   '[]'$   '[]'$   '[]'$   '[]'$   '[]'$   '[]'$    Power Wheelchair: alternative controls  '[x]'$  NA  '[]'$   '[]'$   '[]'$   '[]'$   '[]'$   '[]'$   '[]'$   '[]'$   '[]'$   '[]'$   '[]'$    Summary:  The least costly alternative for independent functional mobility was found to be:    '[]'$  Crutch/Cane  '[]'$  Walker '[]'$  Manual w/c  '[]'$   Manual w/c with power assist   '[]'$  Scooter   '[x]'$  Power w/c std joystick   '[]'$  Power w/c alternative control        '[]'$  Requires dependent care mobility device   Media planner for [employer] skills are adequate for safe mobility equipment operation  '[x]'$   Yes '[]'$   No  Patient is willing and motivated to use recommended mobility equipment  '[x]'$   Yes '[]'$   No       '[]'$  Patient is unable to safely operate mobility equipment independently and requires dependent care equipment Comments:           SENSATION and SKIN ISSUES:  Sensation '[x]'$  Intact  '[]'$  Impaired '[]'$  Absent '[]'$  Hyposensate '[]'$  Hypersensate  '[]'$  Defensiveness  Location(s) of impairment:    Pressure Relief Method(s):  '[x]'$  Lean side to side to offload (without risk of falling)  '[x]'$   W/C push up (4+ times/hour for 15+ seconds) '[x]'$  Stand up (without risk of falling)    '[]'$  Other: (Describe): Effective pressure relief method(s) above can be performed consistently throughout the day: '[x]'$  Yes  '[]'$  No If not, Why?:  Skin Integrity Risk:       '[x]'$  Low risk           '[]'$  Moderate risk            '[]'$  High risk  If high risk, explain:   Skin Issues/Skin Integrity  Current skin Issues  '[]'$  Yes '[x]'$  No '[x]'$  Intact  '[]'$   Red area   '[]'$   Open area  '[]'$  Scar tissue  '[]'$  At risk from prolonged sitting  Where: History of Skin Issues  '[]'$  Yes '[x]'$  No Where : When: Stage: Hx of skin flap surgeries  '[]'$  Yes '[x]'$  No Where:  When:  Pain: '[x]'$  Yes '[]'$  No   Pain Location(s): bil knees Intensity  scale: (0-10) : 8/10 with walking How does pain interfere with mobility and/or MRADLs? - difficulty with walking, getting around, goint to gym attenting aquatics classes. Pt used to be 680 lbs and is down to 417 lbs. Wants to loose weight <300 lbs to have knee surgery but needs power chair to get around so he can attend the fitness classes without lot of pain.        MAT EVALUATION:  Neuro-Muscular Status: (Tone, Reflexive, Responses, etc.)     '[x]'$   Intact   '[]'$  Spasticity:  '[]'$  Hypotonicity  '[]'$  Fluctuating  '[]'$  Muscle Spasms  '[]'$  Poor Righting Reactions/Poor Equilibrium Reactions  '[]'$  Primal Reflex(s):    Comments:            COMMENTS:    POSTURE:     Comments:  Pelvis Anterior/Posterior:  '[x]'$  Neutral   '[]'$  Posterior  '[]'$  Anterior  '[]'$  Fixed - No movement '[]'$  Tendency away from neutral '[]'$  Flexible '[]'$  Self-correction '[]'$  External correction Obliquity (viewed from front)  '[x]'$  WFL '[]'$  R Obliquity '[]'$  L Obliquity  '[]'$  Fixed - No movement '[]'$  Tendency away from neutral '[]'$  Flexible '[]'$  Self-correction '[]'$  External correction Rotation  '[x]'$  WFL '[]'$  R anterior '[]'$  L anterior  '[]'$  Fixed - No movement '[]'$  Tendency away from neutral '[]'$  Flexible '[]'$  Self-correction '[]'$  External correction Tonal Influence Pelvis:  '[]'$  Normal '[]'$  Flaccid '[]'$  Low tone '[]'$  Spasticity '[]'$  Dystonia '[]'$  Pelvis thrust '[]'$  Other:    Trunk Anterior/Posterior:  '[]'$  WFL '[x]'$  Thoracic kyphosis '[]'$  Lumbar lordosis  '[]'$  Fixed - No movement '[]'$  Tendency away from neutral '[x]'$  Flexible '[x]'$  Self-correction '[]'$   External correction  '[x]'$  WFL '[]'$  Convex to left  '[]'$  Convex to right '[]'$  S-curve   '[]'$  C-curve '[]'$  Multiple curves '[]'$  Tendency away from neutral '[]'$  Flexible '[]'$  Self-correction '[]'$  External correction Rotation of shoulders and upper trunk:  '[x]'$  Neutral '[]'$  Left-anterior '[]'$  Right- anterior '[]'$  Fixed- no movement '[]'$  Tendency away from neutral '[]'$  Flexible '[]'$  Self correction '[]'$  External correction Tonal influence  Trunk:  '[x]'$  Normal '[]'$  Flaccid '[]'$  Low tone '[]'$  Spasticity '[]'$  Dystonia '[]'$  Other:   Head & Neck  '[x]'$  Functional '[]'$  Flexed    '[]'$  Extended '[]'$  Rotated right  '[]'$  Rotated left '[]'$  Laterally flexed right '[]'$  Laterally flexed left '[]'$  Cervical hyperextension   '[x]'$  Good head control '[]'$  Adequate head control '[]'$  Limited head control '[]'$  Absent head control Describe tone/movement of head and neck:      Lower Extremity Measurements: LE ROM:  Active ROM Right 05/17/2022 Left 05/17/2022  Hip flexion    Hip extension    Hip abduction    Hip adduction    Knee flexion 100 95  Knee extension 0 0  Ankle dorsiflexion 5 5  Ankle plantarflexion     (Blank rows = not tested)  LE MMT:  MMT Right 05/17/2022 Left 05/17/2022  Hip flexion 5 5  Hip extension    Hip abduction 5 5  Hip adduction 5 5  Knee flexion 5 5  Knee extension 5 5  Ankle dorsiflexion 5 5  Ankle plantarflexion     (Blank rows = not tested)  Hip positions:  '[]'$  Neutral   '[x]'$  Abducted   '[]'$  Adducted  '[]'$  Subluxed   '[]'$  Dislocated   '[]'$  Fixed   '[]'$  Tendency away from neutral '[x]'$  Flexible '[x]'$  Self-correction '[x]'$  External correction   Hip Windswept:'[x]'$  Neutral  '[]'$  Right    '[]'$  Left  '[]'$  Subluxed   '[]'$  Dislocated   '[]'$  Fixed   '[]'$  Tendency away from neutral '[]'$  Flexible '[]'$  Self-correction '[]'$  External correction  LE Tone: '[x]'$  Normal '[]'$  Low tone '[]'$  Spasticity '[]'$  Flaccid '[]'$  Dystonia '[]'$  Rocks/Extends at hip '[]'$  Thrust into knee extension '[]'$  Pushes legs downward into footrest  Foot positioning: ROM Concerns: Dorsiflexed: '[]'$  Right   '[]'$  Left Plantar flexed: '[]'$  Right    '[]'$  Left Inversion: '[]'$  Right    '[]'$  Left Eversion: '[]'$  Right    '[]'$  Left  LE Edema: '[x]'$  1+ (Barely detectable impression when finger is pressed into skin)- pt is wearing compression socks currently '[]'$  2+ (slight indentation. 15 seconds to rebound) '[]'$  3+ (deeper indentation. 30 seconds to rebound) '[]'$  4+ (>30 seconds to rebound)  UE Measurements:  UPPER  EXTREMITY ROM:   Active ROM Right 05/17/2022 Left 05/17/2022  Shoulder flexion    Shoulder abduction    Shoulder adduction    Elbow flexion    Elbow extension    Wrist flexion    Wrist extension    (Blank rows = not tested)  UPPER EXTREMITY MMT:  MMT Right 05/17/2022 Left 05/17/2022  Shoulder flexion    Shoulder abduction    Shoulder adduction    Elbow flexion    Elbow extension    Wrist flexion    Wrist extension    Pinch strength    Grip strength    (Blank rows = not tested)  Shoulder Posture:  Right Tendency towards Left  '[x]'$   Functional '[x]'$    '[]'$   Elevation '[]'$    '[]'$   Depression '[]'$    '[]'$   Protraction '[]'$    '[]'$   Retraction '[]'$    '[]'$   Internal rotation '[]'$    '[]'$   External rotation '[]'$    '[]'$   Subluxed '[]'$     UE Tone: '[x]'$  Normal '[]'$  Flaccid '[]'$  Low tone '[]'$  Spasticity  '[]'$  Dystonia '[]'$  Other:   UE Edema: '[x]'$  1+ (Barely detectable impression when finger is pressed into skin) '[]'$  2+ (slight indentation. 15 seconds to rebound) '[]'$  3+ (deeper indentation. 30 seconds to rebound) '[]'$  4+ (>30 seconds to rebound)  Wrist/Hand: Handedness: '[x]'$  Right   '[]'$  Left   '[]'$  NA: Comments:  Right  Left  '[x]'$   WNL '[x]'$    '[]'$   Limitations '[]'$    '[]'$   Contractures '[]'$    '[]'$   Fisting '[]'$    '[]'$   Tremors '[]'$    '[]'$   Weak grasp '[]'$    '[]'$   Poor dexterity '[]'$    '[]'$   Hand movement non functional '[]'$    '[]'$   Paralysis '[]'$         MOBILITY BASE RECOMMENDATIONS and JUSTIFICATION:  MOBILITY BASE  JUSTIFICATION   Manufacturer:   Merits Model:        Atlantis                      Color: Red Seat Width:  24" Seat Depth 20"   '[]'$  Manual mobility base (continue below)   '[]'$  Scooter/POV  '[x]'$  Power mobility base      '[]'$  is not a safe, functional ambulator  '[x]'$  limitation prevents from completing a MRADL(s) within a reasonable time frame    '[x]'$  limitation places at high risk of morbidity or mortality secondary to  the attempts to perform a    MRADL(s)  '[]'$  limitation prevents accomplishing a MRADL(s) entirely  '[]'$  provide  independent mobility  '[x]'$  equipment is a lifetime medical need  '[x]'$  walker or cane inadequate  '[x]'$  any type manual wheelchair      inadequate  '[x]'$  scooter/POV inadequate      '[]'$  requires dependent mobility          MANUAL MOBILITY      '[]'$  Standard manual wheelchair  K0001      Arm:    '[]'$  both '[]'$  right  '[]'$  left      Foot:   '[]'$  both '[]'$  right   '[]'$  left  '[]'$  self-propels wheelchair  '[]'$  will use on regular basis  '[]'$  chair fits throughout home  '[]'$  willing and motivated to use  '[]'$  propels with assistance     '[]'$  dependent use   '[]'$  Standard hemi-manual wheelchair  K0002      Arm:    '[]'$  both '[]'$  right  '[]'$  left      Foot:   '[]'$  both '[]'$  right   '[]'$  left  '[]'$  lower seat height required to foot      propel  '[]'$  short stature  '[]'$  self-propels wheelchair  '[]'$  will use on regular basis  '[]'$  chair fits throughout home  '[]'$  willing and motivated to use   '[]'$  propels with assistance  '[]'$  dependent use   '[]'$  Lightweight manual wheelchair  K0003      Arm:    '[]'$  both '[]'$  right  '[]'$  left      Foot:   '[]'$  both  '[]'$  right  '[]'$  left                   '[]'$  hemi height required  '[]'$  medical condition and weight of  wheelchair affect ability to self      propel standard manual wheelchair in the residence  '[]'$   can and does self-propel (marginal propulsion skills)  '[]'$  daily use _________hours  '[]'$  chair fits throughout home  '[]'$  willing and motivated to use  '[]'$  lower seat height required to foot propel  '[]'$  short stature   '[]'$  High strength lightweight manual  wheelchair (Breezy Ultra 4)  K0004     Arm:    '[]'$  both '[]'$  right  '[]'$  left     Foot:   '[]'$  both '[]'$  right   '[]'$  left                                                                  '[]'$  hemi height required '[]'$  medical condition and weight of wheelchair affect ability to self propel while engaging in frequent MRADL(s) that cannot be performed in a standard or lightweight manual wheelchair  '[]'$  daily use _________hours  '[]'$  chair fits throughout home  '[]'$  willing and motivated  to use  '[]'$  prevent repetitive use injuries   '[]'$  lower seat height required to foot propel  '[]'$  short stature    '[]'$  Ultra-lightweight manual wheelchair  K0005     Arm:    '[]'$  both '[]'$  right  '[]'$  left     Foot:   '[]'$  both '[]'$  right  '[]'$  left      '[]'$  '[]'$  hemi height required  '[]'$  heavy duty    Front seat to floor _____ inches      Rear seat to floor _____ inches      Back height _____ inches     Back angle ______ degrees      Front angle _____ degrees  '[]'$   full-time manual wheelchair user  '[]'$  Requires individualized fitting and optimal adjustments for multiple features that include adjustable axle configuration, fully adjustable center of gravity, wheel camber, seat and back angle, angle of seat slope, which cannot be accommodated by a K0001 through K0004 manual wheelchair  '[]'$  prevent repetitive use injuries  '[]'$  daily use_________hours   '[]'$  user has high activity patterns that frequently require  them  to go out into the community for the purpose of independently accomplishing high level MRADL activities. Examples of these might include a combination of; shopping, work, school, Science writer, childcare, independently loading and unloading from a vehicle etc.  '[]'$  lower seat height required to foot propel  '[]'$  short stature  '[]'$  heavy duty -  weight over 250lbs   '[]'$  Current chair is a K0005   manufacture:___________________  model:_________________  serial#____________________  age:_________    '[]'$  First time [id number] user (complete trial)  K0004 time and # of strokes to propel 30 feet: ________seconds _________strokes  [id number] time and # of strokes to propel 30 feet: ________seconds _________strokes  What was the result of the trial between the K0004 and K0005 manual wheelchair? ___    What features of the K0005 w/c are needed as compared to the K0004 base? Why?___    '[]'$  adjustable seat and back angle changes the angle of seat slope of the frame to attain a gravity assisted position for efficient  propulsion and proper weight distribution along the frame     '[]'$  the front of the wheelchair will be configured higher than the back of the chair to allow gravity to assist the user with postural stability  '[]'$   the center of the wheel will be positioned for stability, safety and efficient propulsion  '[]'$  adjustable axle allows for vertical, horizontal, camber and overall width changes  throughout the wheels for adjustment of the client's exact needs and abilities.   '[]'$  adjustable axle increases the stability and function of the chair allowing for adjustment of the center of gravity.   '[]'$  accommodates the client's anatomical position in the chair maximizing independence in mobility and maneuverability in all environments.   '[]'$  create a minimal fixed tilt-in space to assist in positioning.   '[]'$  Describe users full-time manual wheelchair activity patterns:___    '[]'$  Power assist Comments:  '[]'$  prevent repetitive use injuries  '[]'$  repetitive strain injury present in    shoulder girdle    '[]'$  shoulder pain is (> or =) to 7/10     during manual propulsion       Current Pain _____/10  '[]'$  requires conservation of energy to participate in MRADL(s) runable to propel up ramps or curbs using manual wheelchair  '[]'$  been K0005 user greater than one year  '[]'$  user unwilling to use power      wheelchair (reason): '[]'$  less expensive option to power   wheelchair   '[]'$  rim activated power assist -      decreased strength   '[]'$  Heavy duty manual wheelchair       K0006     Arm:    '[]'$  both '[]'$  right  '[]'$  left     Foot:   '[]'$  both '[]'$  right  '[]'$  left     '[]'$  hemi height required    '[]'$  Dependent base  '[]'$  user exceeds 250lbs  '[]'$  non-functional ambulator    '[]'$  extreme spasticity  '[]'$  over active movement   '[]'$  broken frame/hx of repeated     repairs  '[]'$  able to self-propel in residence       '[]'$  lower seat to floor height required  '[]'$  unable to self-propel in residence   '[]'$  Extra heavy duty manual wheelchair  K0007     Arm:     '[]'$  both '[]'$  right  '[]'$  left     Foot:   '[]'$  both '[]'$  right  '[]'$  left     '[]'$  hemi height required  '[]'$  Dependent base  '[]'$  user exceeds 300lbs  '[]'$  non-functional ambulator    '[]'$  able to self-propel in residence   '[]'$  lower seat to floor height required  '[]'$  unable to self-propel in residence     '[]'$  Manual wheelchair with tilt [id number]      (Manual "Tilt-n-Space")  '[]'$  patient is dependent for transfers  '[]'$  patient requires frequent       positioning for pressure relief   '[]'$  patient requires frequent      positioning for poor/absent trunk control        '[]'$  Stroller Base  '[]'$  infant/child   '[]'$  unable to propel manual      wheelchair  '[]'$  allows for growth  '[]'$  non-functional ambulator  '[]'$  non-functional UE  '[]'$  independent mobility is not a goal at this time    [place] handles  '[]'$  extended  rangle adjustable   '[]'$  standard  '[]'$  caregiver access  '[]'$  caregiver assist    '[]'$  allows "hooking" to enable      increased ability to perform ADLs or maintain balance   '[]'$  Angle Adjustable Back  '[]'$  postural control  '[]'$  control of tone/spasticity  '[]'$   accommodation of range of motion  '[]'$  UE functional control  '[]'$  accommodation for seating system    Rear wheel placement  '[]'$  std/fixed rfully adjustableramputee   '[]'$  camber ________degree  '[]'$  removable rear wheel  '[]'$  non-removable rear wheel  Wheel size _______  Wheel style_______________________  '[]'$  improved UE access to wheels  '[]'$  increase propulsion ability  '[]'$  improved stability  '[]'$  changing angle in space for      improvement of postural stability  '[]'$  remove for transport    '[]'$  allow for seating system to fit on      base  '[]'$  amputee placement  '[]'$  1-arm drive access   r R  r L  '[]'$  enable propulsion of manual       wheelchair with one arm    '[]'$  amputee placement   Wheel rims/ Hand rims  '[]'$  Standard    '[]'$  Specialized-____ '[]'$  provide ability to propel manual   '[]'$  increase self-propulsion with hand wheelchair weakness/decreased grasp      '[]'$  Spoke protector/guard   '[]'$  prevent hands from getting caught in spokes   Tires:  '[]'$  pneumatic  '[]'$  flat free inserts  '[]'$  solid  Style:  '[]'$  decrease roll resistance              '[]'$  prevent frequent flats  '[]'$  increase shock absorbency  '[]'$  decrease maintenance   '[]'$  decrease pain from road shock    '[]'$  decrease spasms from road shock    Wheel Locks:    '[]'$  push '[]'$  pull '[]'$  scissor  '[]'$  lock wheels for transfers  '[]'$  lock wheels from rolling   Brake/wheel lock extension:  '[]'$  R  '[]'$  L  '[]'$  allow user to operate wheel locks due to decreased reach or strength   [place] housing:  Development worker, international aid size:                      Style:                                          '[]'$  suspension fork  '[]'$  maneuverability   '[]'$  stability of wheelchair   '[]'$  durability  '[]'$  maintenance  '[]'$  angle adjustment for posture  '[]'$  allow for feet to come under        wheelchair base  '[]'$  allows change in seat to floor      height   '[]'$  increase shock absorbency  '[]'$  decrease pain from road shock  '[]'$  decrease spasms from road    shock   '[]'$  Side guards  '[]'$  prevent clothing getting caught in      wheel or becoming soiled  rprovide hip and pelvic stability  '[]'$  eliminates contact between body and wheels  '[]'$  limit hand contact with wheels   '[]'$  Anti-tippers      '[]'$  prevent wheelchair from tipping    backward  '[]'$  assist caregiver with curbs     POWER MOBILITY      '[]'$  Scooter/POV    '[]'$  can safely operate   '[]'$  can safely transfer   '[]'$  has adequate trunk stability   '[]'$  cannot functionally propel  manual wheelchair    '[]'$  Power mobility base    '[]'$  non-ambulatory   '[x]'$  cannot functionally propel manual wheelchair   '[]'$  cannot functionally and safely      operate scooter/POV  '[x]'$  can safely operate  power       wheelchair  '[x]'$  home is accessible  '[x]'$  willing to use power wheelchair     Tilt  '[]'$  Powered tilt on powered chair  '[]'$  Powered tilt on manual chair '[]'$  Manual tilt on manual chair Comments:  '[]'$  change position for pressure       '[]'$  elief/cannot weight shift   '[]'$  change position against      gravitational force on head and      shoulders   '[]'$  decrease pain  '[]'$  blood pressure management   '[]'$  control autonomic dysreflexia  '[]'$  decrease respiratory distress  '[]'$  management of spasticity  '[]'$  management of low tone  '[]'$  facilitate postural control   '[]'$  rest periods   '[]'$  control edema  '[]'$  increase sitting tolerance   '[]'$  aid with transfers     Recline   '[]'$  Power recline on power chair  '[]'$  Manual recline on manual chair  Comments:    '[]'$  intermittent catheterization  '[]'$  manage spasticity  '[]'$  accommodate femur to back angle  '[]'$  change position for pressure relief/cannot weight shift rhigh risk of pressure sore development  '[]'$  tilt alone does not accomplish     effective pressure relief, maximum pressure relief achieved at -     _______ degrees tilt       _______ degrees recline   '[]'$  difficult to transfer to and from bed '[]'$  rest periods and sleeping in chair  '[]'$  repositioning for transfers  '[]'$  bring to full recline for ADL care  '[]'$  clothing/diaper changes in chair  '[]'$  gravity PEG tube feeding  '[]'$  head positioning  '[]'$  decrease pain  '[]'$  blood pressure management   '[]'$  control autonomic dysreflexia  '[]'$  decrease respiratory distress  '[]'$  user on ventilator     Elevator on mobility base  '[]'$  Power wheelchair  '[]'$  Scooter  '[]'$  increase Indep in transfers   '[]'$  increase Indep in ADLs    '[]'$  bathroom function and safety  '[]'$  kitchen/cooking function and safety  '[]'$  shopping  '[]'$  raise height for communication at standing level  '[]'$  raise height for eye contact which reduces cervical neck strain and pain  '[]'$  drive at raised height for safety and navigating crowds  '[]'$  Other:   '[]'$  Vertical position system (anterior tilt)     (Drive locks-out)    '[]'$  Stand       (Drive enabled)  '[]'$  independent weight bearing  '[]'$  decrease joint contractures  '[]'$  decrease/manage spasticity  '[]'$  decrease/manage spasms  '[]'$  pressure distribution  away from   scapula, sacrum, coccyx, and ischial tuberosity  '[]'$  increase digestion and elimination   '[]'$  access to counters and cabinets  '[]'$  increase reach  '[]'$  increase interaction with others at eye level, reduces neck strain  '[]'$  increase performance of       MRADL(s)      Power elevating legrest    '[]'$  Center mount (Single) 85-170 degrees       '[]'$  Standard (Pair) 100-170 degrees  '[]'$  position legs at 90 degrees, not available with std power ELR  '[]'$  center mount tucks into chair to decrease turning radius in home, not available with std power ELR  '[]'$  provide change in position for LE  '[]'$  elevate legs during recline    '[]'$  maintain placement of feet on      footplate  '[]'$  decrease edema  '[]'$  improve circulation  '[]'$  actuator needed to elevate legrest  '[]'$  actuator needed to articulate legrest preventing  knees from flexing  '[]'$  Increase ground clearance over      curbs  '[]'$   STD (pair) independently                     elevate legrest   POWER WHEELCHAIR CONTROLS      Controls/input device  '[]'$  Expandable  '[x]'$  Non-expandable  '[x]'$  Proportional  '[]'$  Right Hand '[]'$  Left Hand  '[]'$  Non-proportional/switches/head-array  '[]'$  Electrical/proximity         '[]'$   Mechanical      Manufacturer:___________________   Type:________________________ '[x]'$  provides access for controlling wheelchair  '[]'$  programming for accurate control  '[]'$  progressive disease/changing condition  '[]'$  required for alternative drive      controls       '[]'$  lacks motor control to operate  proportional drive control  '[]'$  unable to understand proportional controls  '[]'$  limited movement/strength  '[]'$  extraneous movement / tremors / ataxic / spastic       '[]'$  Upgraded electronics      controller/harness    '[]'$  Single power (tilt or recline)   '[]'$  Expandable    '[]'$  Non-expandable plus   '[]'$  Multi-power (tilt, recline, power legrest, power seat lift, vertical positioning system, stand)  '[]'$  allows input device to communicate with drive motors   '[]'$  harness provides necessary     connections between the controller, input device, and seat functions     '[]'$  needed in order to operate   power seat functions through    joystick/ input device  '[]'$  required for alternative drive      controls     '[]'$  Enhanced display  '[]'$  required to connect all alternative drive controls   '[]'$  required for upgraded joystick      (lite-throw, heavy duty, micro)  '[]'$  Allows user to see in which mode and drive the wheelchair is set; necessary for alternate controls       '[]'$  Upgraded tracking electronics  '[]'$  correct tracking when on uneven surfaces makes switch driving more efficient and less fatiguing  '[]'$  increase safety when driving  '[]'$  increase ability to traverse      thresholds    '[]'$  Safety / reset / mode switches     Type:    '[]'$  Used to change modes and stop the wheelchair when driving     '[x]'$  Mount for joystick / input device/switches  '[x]'$  swing away for access or      transfers   '[]'$  attaches joystick / input device / switches to wheelchair   '[]'$  provides for consistent access  '[]'$  midline for optimal placement    '[]'$  Attendant controlled joystick plus     mount  '[]'$  safety  '[]'$  long distance driving  '[]'$  operation of seat functions  '[]'$  compliance with transportation regulations    '[x]'$  Battery  '[x]'$  required to power (power assist / scooter/ power wc / other):       '[]'$  Power inverter (24V to 12V)  '[]'$  required for ventilator / respiratory equipment / other:     CHAIR OPTIONS MANUAL & POWER      Armrests   '[x]'$  adjustable height rremovable  '[]'$  swing away '[]'$  fixed  '[x]'$  flip back  '[]'$  reclining  '[x]'$  full length pads '[]'$  desk '[]'$  tube arms '[]'$  gel pads  '[x]'$  provide support with elbow at 90    '[]'$  remove/flip back/swing away for  transfers  '[x]'$  provide support and positioning of upper body    '[]'$  allow  to come closer to table top  '[]'$  remove for access to tables  '[]'$  provide support for w/c tray  '[]'$  change of height/angles for       variable activities   '[]'$  Elbow  support / Elbow stop  '[]'$  keep elbow positioned on arm pad  '[]'$  keep arms from falling off arm pad      during tilt and/or recline   Upper Extremity Support  '[]'$  Arm trough  '[]'$   R  '[]'$   L  Style:  '[]'$  swivel mount '[]'$  fixed mount   '[]'$  posterior hand support  '[]'$   tray  '[]'$  full tray  '[]'$  joystick cut out  '[]'$   R  '[]'$   L  Style:  '[]'$  decrease gravitational pull on      shoulders  '[]'$  provide support to increase UE  function  '[]'$  provide hand support in natural    position  '[]'$  position flaccid UE  '[]'$  decrease subluxation    rdecrease edema       '[]'$  manage spasticity   '[]'$  provide midline positioning  '[]'$  provide work surface  '[]'$  placement for AAC/ Computer/ EADL   Battery:  Grouped 24 x 2        Hangers/ Legrests   '[]'$  ______ degree  '[]'$  Elevating '[]'$  articulating  '[]'$  swing away '[]'$  fixed '[]'$  lift off  '[]'$  heavy duty '[]'$  adjustable knee angle  '[]'$  adjustable calf panel   '[]'$  longer extension tube              '[]'$  provide LE support  '[]'$  maintain placement of feet on      footplate   '[]'$  accommodate lower leg length  '[]'$  accommodate to hamstring       tightness  '[]'$  enable transfers  '[]'$  provide change in position for LE's  '[]'$  elevate legs during recline    '[]'$  decrease edema  '[]'$  durability      Foot support   '[x]'$  footplate '[]'$  R '[]'$  L '[x]'$  flip up           '[x]'$  Depth adjustable   '[x]'$  angle adjustable  '[]'$  foot board/one piece    '[x]'$  provide foot support  '[]'$  accommodate to ankle ROM  '[]'$  allow foot to go under wheelchair base  '[x]'$  enable transfers     '[]'$  Shoe holders  '[]'$  position foot    '[]'$  decrease / manage spasticity  '[]'$  control position of LE  '[]'$  stability    '[]'$  safety     '[]'$  Ankle strap/heel      loops  '[]'$  support foot on foot support  '[]'$  decrease extraneous movement  '[]'$  provide input to heel   '[]'$  protect foot     '[]'$  Amputee adapter '[]'$  R  '[]'$  L     Style:                  Size:  '[]'$  Provide support for stump/residual extremity    '[]'$  Transportation tie-down  '[]'$  to provide crash tested tie-down  brackets    '[]'$  Crutch/cane holder    '[]'$  O2 holder    '[]'$  IV hanger   '[]'$  Ventilator tray/mount    '[]'$  stabilize accessory on wheelchair       Component  Justification     '[]'$  Seat cushion      '[]'$  accommodate impaired sensation  '[]'$  decubitus ulcers present or history  '[]'$  unable to shift weight  '[]'$  increase pressure distribution  '[]'$  prevent pelvic extension  '[]'$   custom required "off-the-shelf"    seat cushion will not accommodate deformity  '[]'$  stabilize/promote pelvis alignment  '[]'$  stabilize/promote femur alignment  '[]'$  accommodate obliquity  '[]'$  accommodate multiple deformity  '[]'$  incontinent/accidents  '[]'$  low maintenance     '[]'$  seat mounts                 '[]'$  fixed '[]'$  removable  '[]'$  attach seat platform/cushion to wheelchair frame    '[]'$  Seat wedge    '[]'$  provide increased aggressiveness of seat shape to decrease sliding  down in the seat  '[]'$  accommodate ROM        '[]'$  Cover replacement   '[]'$  protect back or seat cushion  '[]'$  incontinent/accidents    '[]'$  Solid seat / insert    '[]'$  support cushion to prevent      hammocking  '[]'$  allows attachment of cushion to mobility base    '[]'$  Lateral pelvic/thigh/hip     support (Guides)     '[]'$  decrease abduction  '[]'$  accommodate pelvis  '[]'$  position upper legs  '[]'$  accommodate spasticity  '[]'$  removable for transfers     '[]'$  Lateral pelvic/thigh      supports mounts  '[]'$  fixed   '[]'$  swing-away   '[]'$  removable  '[]'$  mounts lateral pelvic/thigh supports     '[]'$  mounts lateral pelvic/thigh supports swing-away or removable for transfers    '[]'$  Medial thigh support ([place]mel)  rdecrease adduction raccommodate ROM  rremove for transfers    ralignment      '[]'$  Medial thigh   '[]'$  fixed      support mounts      '[]'$  swing-away   '[]'$  removable  '[]'$  mounts medial thigh supports   '[]'$  Mounts medial supports swing- away or removable for transfers       Component  Justification   '[]'$  Back       '[]'$  provide posterior trunk support '[]'$  facilitate tone  '[]'$  provide lumbar/sacral support '[]'$   accommodate deformity  '[]'$  support trunk in midline   '[]'$  custom required "off-the-shelf" back support will not accommodate deformity   '[]'$  provide lateral trunk support '[]'$  accommodate or decrease tone            '[]'$  Back mounts  '[]'$  fixed  '[]'$  removable  '[]'$  attach back rest/cushion to wheelchair frame   '[]'$  Lateral trunk      supports  '[]'$  R '[]'$  L  '[]'$  decrease lateral trunk leaning  '[]'$  accommodate asymmetry    '[]'$  contour for increased contact  '[]'$  safety    '[]'$  control of tone    '[]'$  Lateral trunk      supports mounts  '[]'$  fixed  '[]'$  swing-away   '[]'$  removable  '[]'$  mounts lateral trunk supports     '[]'$  Mounts lateral trunk supports swing-away or removable for transfers   '[]'$  Anterior chest      strap, vest     '[]'$  decrease forward movement of shoulder  '[]'$  decrease forward movement of trunk  '[]'$  safety/stability  '[]'$  added abdominal support  '[]'$  trunk alignment  '[]'$  assistance with shoulder control   '[]'$  decrease shoulder elevation    '[]'$  Headrest      '[]'$  provide posterior head support  '[]'$  provide posterior neck support  '[]'$  provide lateral head support  '[]'$  provide anterior head support  '[]'$  support during tilt and recline  '[]'$  improve feeding     '[]'$  improve respiration  '[]'$  placement of switches  '[]'$  safety    '[]'$   accommodate ROM   '[]'$  accommodate tone  '[]'$  improve visual orientation   '[]'$  Headrest           '[]'$  fixed '[]'$  removable '[]'$  flip down      Mounting hardware   '[]'$  swing-away laterals/switches  '[]'$  mount headrest   '[]'$  mounts headrest flip down or  removable for transfers  '[]'$  mount headrest swing-away laterals   '[]'$  mount switches     '[]'$  Neck Support    '[]'$  decrease neck rotation  '[]'$  decrease forward neck flexion   Pelvic Positioner    '[]'$  std hip belt          '[]'$  padded hip belt  '[]'$  dual pull hip belt  '[]'$  four point hip belt  '[]'$  stabilize tone  '[]'$  decrease falling out of chair  '[]'$  prevent excessive extension  '[]'$  special pull angle to control      rotation  '[]'$  pad for protection over boney       prominence  '[]'$  promote comfort    '[]'$  Essential needs        bag/pouch   '[]'$  medicines '[]'$  special food rorthotics '[]'$  clothing changes '[]'$  diapers   '[]'$  catheter/hygiene '[]'$  ostomy supplies   The above equipment has a life- long use expectancy.  Growth and changes in medical and/or functional conditions would be the exceptions.   SUMMARY:  Why mobility device was selected; include why a lower level device is not appropriate:   ASSESSMENT:  CLINICAL IMPRESSION: Patient is a 61 y.o. male who was seen today for physical therapy evaluation and treatment for wheelchair evaluation. Patient presents with bil chronic knee pain that is preventing him with prolonged standing and walking which is impacting his function at home and community access. Pt demonstrates decreased bil knee ROM, decreased walking endurance, and increased pain. Patient is currently ambulating for short distances (within home) with [place]. Pt has limited access to community as he requires frequent sitting breaks due to increased pain which impacts his IADLs. Patient is not safe with manual propulsion wheelchair as it will be not functional based on his current weight and history of carpel tunnel syndrome in his hands. Pt will benefit from power wheelchair to assist with community access, improve function at home and reduce the risk for fall and injury.   OBJECTIVE IMPAIRMENTS Abnormal gait, cardiopulmonary status limiting activity, decreased activity tolerance, decreased balance, decreased endurance, decreased mobility, difficulty walking, decreased ROM, decreased strength, hypomobility, increased edema, increased fascial restrictions, impaired flexibility, postural dysfunction, obesity, and pain.   ACTIVITY LIMITATIONS carrying, lifting, bending, standing, squatting, stairs, transfers, bathing, and locomotion level  PARTICIPATION LIMITATIONS: meal prep, cleaning, laundry, shopping, community activity, and occupation  PERSONAL FACTORS  Past/current experiences, Time since onset of injury/illness/exacerbation, Transportation, and 3+ comorbidities: Carpal tunnel syndrome, DM, HTN, morbid obesity, chronic knee pain  are also affecting patient's functional outcome.   REHAB POTENTIAL: Good  CLINICAL DECISION MAKING: Stable/uncomplicated  EVALUATION COMPLEXITY: Low                                   GOALS: One time visit. No goals established.    PLAN: PT FREQUENCY: one time visit    Kerrie Pleasure, PT 05/17/2022, 12:56 PM    I concur with the above findings and recommendations of the therapist:  Physician name printed:         Physician's signature:  Date:

## 2022-05-18 DIAGNOSIS — M25561 Pain in right knee: Secondary | ICD-10-CM | POA: Diagnosis not present

## 2022-05-22 DIAGNOSIS — M25561 Pain in right knee: Secondary | ICD-10-CM | POA: Diagnosis not present

## 2022-05-23 DIAGNOSIS — M25561 Pain in right knee: Secondary | ICD-10-CM | POA: Diagnosis not present

## 2022-05-24 DIAGNOSIS — M25561 Pain in right knee: Secondary | ICD-10-CM | POA: Diagnosis not present

## 2022-05-25 ENCOUNTER — Other Ambulatory Visit: Payer: Self-pay | Admitting: Family Medicine

## 2022-05-25 DIAGNOSIS — M25561 Pain in right knee: Secondary | ICD-10-CM | POA: Diagnosis not present

## 2022-06-19 DIAGNOSIS — M25561 Pain in right knee: Secondary | ICD-10-CM | POA: Diagnosis not present

## 2022-06-20 DIAGNOSIS — M25561 Pain in right knee: Secondary | ICD-10-CM | POA: Diagnosis not present

## 2022-06-21 DIAGNOSIS — M25561 Pain in right knee: Secondary | ICD-10-CM | POA: Diagnosis not present

## 2022-06-22 ENCOUNTER — Other Ambulatory Visit: Payer: Self-pay | Admitting: Family Medicine

## 2022-06-22 DIAGNOSIS — M25561 Pain in right knee: Secondary | ICD-10-CM | POA: Diagnosis not present

## 2022-06-22 DIAGNOSIS — E785 Hyperlipidemia, unspecified: Secondary | ICD-10-CM

## 2022-06-22 DIAGNOSIS — E1169 Type 2 diabetes mellitus with other specified complication: Secondary | ICD-10-CM

## 2022-06-26 DIAGNOSIS — M25561 Pain in right knee: Secondary | ICD-10-CM | POA: Diagnosis not present

## 2022-06-27 DIAGNOSIS — M25561 Pain in right knee: Secondary | ICD-10-CM | POA: Diagnosis not present

## 2022-06-28 DIAGNOSIS — M25561 Pain in right knee: Secondary | ICD-10-CM | POA: Diagnosis not present

## 2022-06-29 DIAGNOSIS — M25561 Pain in right knee: Secondary | ICD-10-CM | POA: Diagnosis not present

## 2022-07-03 DIAGNOSIS — M25561 Pain in right knee: Secondary | ICD-10-CM | POA: Diagnosis not present

## 2022-07-04 DIAGNOSIS — M25561 Pain in right knee: Secondary | ICD-10-CM | POA: Diagnosis not present

## 2022-07-05 ENCOUNTER — Ambulatory Visit: Payer: Self-pay | Admitting: Student

## 2022-07-05 DIAGNOSIS — M25561 Pain in right knee: Secondary | ICD-10-CM | POA: Diagnosis not present

## 2022-07-06 DIAGNOSIS — M25561 Pain in right knee: Secondary | ICD-10-CM | POA: Diagnosis not present

## 2022-07-10 DIAGNOSIS — M25561 Pain in right knee: Secondary | ICD-10-CM | POA: Diagnosis not present

## 2022-07-11 DIAGNOSIS — M25561 Pain in right knee: Secondary | ICD-10-CM | POA: Diagnosis not present

## 2022-07-12 DIAGNOSIS — M25561 Pain in right knee: Secondary | ICD-10-CM | POA: Diagnosis not present

## 2022-07-13 DIAGNOSIS — M25561 Pain in right knee: Secondary | ICD-10-CM | POA: Diagnosis not present

## 2022-07-17 ENCOUNTER — Ambulatory Visit (INDEPENDENT_AMBULATORY_CARE_PROVIDER_SITE_OTHER): Payer: Medicaid Other | Admitting: Student

## 2022-07-17 ENCOUNTER — Encounter: Payer: Self-pay | Admitting: Student

## 2022-07-17 VITALS — BP 120/84 | HR 86 | Ht 68.5 in | Wt >= 6400 oz

## 2022-07-17 DIAGNOSIS — E1140 Type 2 diabetes mellitus with diabetic neuropathy, unspecified: Secondary | ICD-10-CM | POA: Diagnosis not present

## 2022-07-17 DIAGNOSIS — M25561 Pain in right knee: Secondary | ICD-10-CM | POA: Diagnosis not present

## 2022-07-17 DIAGNOSIS — G8929 Other chronic pain: Secondary | ICD-10-CM

## 2022-07-17 DIAGNOSIS — M25562 Pain in left knee: Secondary | ICD-10-CM

## 2022-07-17 DIAGNOSIS — Z7409 Other reduced mobility: Secondary | ICD-10-CM | POA: Diagnosis not present

## 2022-07-17 MED ORDER — TRULICITY 4.5 MG/0.5ML ~~LOC~~ SOAJ: 4.5000 mg | SUBCUTANEOUS | 2 refills | Status: DC

## 2022-07-17 NOTE — Patient Instructions (Addendum)
Craig [name]  I will see about getting you in to [facility] for your PT services. I am increasing your Trulicity to 4.'5mg'$ /week. Let's see you back in a month. I am submitting your power wheelchair form but do NOT let this be a hindrance to the excellent progress you've made.   [name], MD

## 2022-07-17 NOTE — Progress Notes (Unsigned)
   SUBJECTIVE:   CHIEF COMPLAINT / HPI:  No chief complaint on file.  *** Craig [name] is a 61 y.o. male with a past medical history of *** presenting to the clinic for   J6RC Increased Trulicity dose, but still feels like weight is not dropping,   Wheelchair I have reviewed and agree with the PT evaluation for Power Wheelchair.  Patient requires power wheelchair to travel from bed to bathroom and accomplish toileting tasks.  Also needs to get to kitchen to eat and prepare meals.  Has a nurse that comes by for 3 hours 3 times a week to help with many tasks, but he must perform all of his own ADLs when she is not there the other 4 days of the week.  Currently has a mechanical wheelchair, but needs a power wheelchair because he has severe knee and arm pain that limits his ability to move himself. Apartment house is wheelchair accessible and so is his bathroom.  Has a large tub with handrails for transferring in the bathroom and has wide accessible doors.  Patient specifically upgraded his apartment to be wheelchair accessible and accommodate his needs.  Patient has the mental and physical capacity to operate a power wheelchair inside his home.  He desires this mobility aid and plans to use it in the home.  PERTINENT  PMH / PSH: ***  Patient Care Team: Craig Gibson, MD as PCP - General (Family Medicine) Craig Heinz, MD as PCP - Cardiology (Cardiology)  OBJECTIVE:   BP (!) 156/82   Pulse 86   Ht 5' 8.5" (1.74 m)   Wt (!) 414 lb (187.8 kg)   SpO2 96%   BMI 62.03 kg/m  Weight: 414 lbs Height: 5' 8.5"   Physical Exam     07/17/2022    2:35 PM  Depression screen PHQ 2/9  Decreased Interest 1  Down, Depressed, Hopeless 1  PHQ - 2 Score 2  Altered sleeping 3  Tired, decreased energy 2  Change in appetite 1  Feeling bad or failure about yourself  1  Trouble concentrating 1  Moving slowly or fidgety/restless 1  Suicidal thoughts 1  PHQ-9 Score 12    {Show  previous vital signs (optional):23777}  {Labs  Heme  Chem  Endocrine  Serology  Results Review (optional):23779}  ASSESSMENT/PLAN:   No problem-specific Assessment & Plan notes found for this encounter.  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  No follow-ups on file.  Craig [name] Mining engineer, [place]

## 2022-07-18 DIAGNOSIS — M25561 Pain in right knee: Secondary | ICD-10-CM | POA: Diagnosis not present

## 2022-07-19 DIAGNOSIS — Z7409 Other reduced mobility: Secondary | ICD-10-CM | POA: Insufficient documentation

## 2022-07-19 DIAGNOSIS — M25561 Pain in right knee: Secondary | ICD-10-CM | POA: Diagnosis not present

## 2022-07-19 NOTE — Assessment & Plan Note (Signed)
See wheelchair assessment above. Patient is a candidate for a power chair as it is necessary for him to perform his ADLs at home. I am assured that this will not be a hindrance to his ongoing weight loss efforts as his HH aid is very good about encouraging activity/exercise. Hope to also get him back into bowling soon.

## 2022-07-19 NOTE — Assessment & Plan Note (Addendum)
Increase Trulicity 3.'0mg'$ /week to 4.'5mg'$ /week. Will see back in a few months, if weight no better, will transition to [facility]

## 2022-07-19 NOTE — Assessment & Plan Note (Signed)
Referred to [facility] at [facility] per pt request

## 2022-07-20 DIAGNOSIS — M25561 Pain in right knee: Secondary | ICD-10-CM | POA: Diagnosis not present

## 2022-07-24 DIAGNOSIS — M25561 Pain in right knee: Secondary | ICD-10-CM | POA: Diagnosis not present

## 2022-07-25 DIAGNOSIS — M25561 Pain in right knee: Secondary | ICD-10-CM | POA: Diagnosis not present

## 2022-07-26 DIAGNOSIS — M25561 Pain in right knee: Secondary | ICD-10-CM | POA: Diagnosis not present

## 2022-07-27 DIAGNOSIS — M25561 Pain in right knee: Secondary | ICD-10-CM | POA: Diagnosis not present

## 2022-07-31 DIAGNOSIS — M25561 Pain in right knee: Secondary | ICD-10-CM | POA: Diagnosis not present

## 2022-08-01 DIAGNOSIS — M25561 Pain in right knee: Secondary | ICD-10-CM | POA: Diagnosis not present

## 2022-08-02 DIAGNOSIS — M25561 Pain in right knee: Secondary | ICD-10-CM | POA: Diagnosis not present

## 2022-08-03 DIAGNOSIS — M25561 Pain in right knee: Secondary | ICD-10-CM | POA: Diagnosis not present

## 2022-08-08 DIAGNOSIS — M25561 Pain in right knee: Secondary | ICD-10-CM | POA: Diagnosis not present

## 2022-08-09 DIAGNOSIS — M25561 Pain in right knee: Secondary | ICD-10-CM | POA: Diagnosis not present

## 2022-08-10 DIAGNOSIS — M25561 Pain in right knee: Secondary | ICD-10-CM | POA: Diagnosis not present

## 2022-08-15 DIAGNOSIS — M25561 Pain in right knee: Secondary | ICD-10-CM | POA: Diagnosis not present

## 2022-08-16 DIAGNOSIS — M25561 Pain in right knee: Secondary | ICD-10-CM | POA: Diagnosis not present

## 2022-08-17 ENCOUNTER — Other Ambulatory Visit: Payer: Self-pay | Admitting: Student

## 2022-08-17 ENCOUNTER — Other Ambulatory Visit: Payer: Self-pay | Admitting: Family Medicine

## 2022-08-17 DIAGNOSIS — E1140 Type 2 diabetes mellitus with diabetic neuropathy, unspecified: Secondary | ICD-10-CM

## 2022-08-17 DIAGNOSIS — M25561 Pain in right knee: Secondary | ICD-10-CM | POA: Diagnosis not present

## 2022-08-17 MED ORDER — TRULICITY 4.5 MG/0.5ML ~~LOC~~ SOAJ: 4.5000 mg | SUBCUTANEOUS | 2 refills | Status: DC

## 2022-08-17 MED ORDER — PANTOPRAZOLE SODIUM 40 MG PO TBEC: 40.0000 mg | DELAYED_RELEASE_TABLET | Freq: Every day | ORAL | 3 refills | Status: DC

## 2022-08-21 ENCOUNTER — Ambulatory Visit: Payer: Self-pay | Admitting: Student

## 2022-08-21 ENCOUNTER — Telehealth: Payer: Self-pay

## 2022-08-21 DIAGNOSIS — M25561 Pain in right knee: Secondary | ICD-10-CM | POA: Diagnosis not present

## 2022-08-21 NOTE — Telephone Encounter (Signed)
Patient calls nurse line regarding need to reschedule today's appointment. He reports that he is not able to come into office today due to mobility issues.   He has questions regarding Trulicity. He is asking if he should continue taking this medication at current dose or if he should switch to an alternative.   He rescheduled appointment for 09/04/22. He is asking how he should proceed with medication in the meantime.   Forwarding message to PCP.   Talbot Grumbling, RN

## 2022-08-22 DIAGNOSIS — M25561 Pain in right knee: Secondary | ICD-10-CM | POA: Diagnosis not present

## 2022-08-23 DIAGNOSIS — M25561 Pain in right knee: Secondary | ICD-10-CM | POA: Diagnosis not present

## 2022-08-24 DIAGNOSIS — M25561 Pain in right knee: Secondary | ICD-10-CM | POA: Diagnosis not present

## 2022-08-28 DIAGNOSIS — M25561 Pain in right knee: Secondary | ICD-10-CM | POA: Diagnosis not present

## 2022-08-29 DIAGNOSIS — M25561 Pain in right knee: Secondary | ICD-10-CM | POA: Diagnosis not present

## 2022-08-30 DIAGNOSIS — M25561 Pain in right knee: Secondary | ICD-10-CM | POA: Diagnosis not present

## 2022-08-31 DIAGNOSIS — M25561 Pain in right knee: Secondary | ICD-10-CM | POA: Diagnosis not present

## 2022-09-04 ENCOUNTER — Encounter: Payer: Self-pay | Admitting: Student

## 2022-09-04 ENCOUNTER — Ambulatory Visit: Payer: Medicaid Other | Admitting: Student

## 2022-09-04 ENCOUNTER — Other Ambulatory Visit: Payer: Self-pay

## 2022-09-04 VITALS — BP 144/81 | HR 96 | Wt >= 6400 oz

## 2022-09-04 DIAGNOSIS — F419 Anxiety disorder, unspecified: Secondary | ICD-10-CM | POA: Diagnosis not present

## 2022-09-04 DIAGNOSIS — F32A Depression, unspecified: Secondary | ICD-10-CM | POA: Diagnosis not present

## 2022-09-04 DIAGNOSIS — Z0000 Encounter for general adult medical examination without abnormal findings: Secondary | ICD-10-CM | POA: Diagnosis not present

## 2022-09-04 DIAGNOSIS — I10 Essential (primary) hypertension: Secondary | ICD-10-CM | POA: Diagnosis not present

## 2022-09-04 DIAGNOSIS — E1140 Type 2 diabetes mellitus with diabetic neuropathy, unspecified: Secondary | ICD-10-CM | POA: Diagnosis not present

## 2022-09-04 DIAGNOSIS — Z6841 Body Mass Index (BMI) 40.0 and over, adult: Secondary | ICD-10-CM

## 2022-09-04 DIAGNOSIS — Z9189 Other specified personal risk factors, not elsewhere classified: Secondary | ICD-10-CM | POA: Diagnosis not present

## 2022-09-04 DIAGNOSIS — M25561 Pain in right knee: Secondary | ICD-10-CM | POA: Diagnosis not present

## 2022-09-04 LAB — POCT GLYCOSYLATED HEMOGLOBIN (HGB A1C): HbA1c, POC (controlled diabetic range): 6 % (ref 0.0–7.0)

## 2022-09-04 MED ORDER — EMPAGLIFLOZIN 10 MG PO TABS: 10.0000 mg | ORAL_TABLET | Freq: Every day | ORAL | 3 refills | Status: DC

## 2022-09-04 MED ORDER — AMLODIPINE-OLMESARTAN 5-40 MG PO TABS: 1.0000 | ORAL_TABLET | Freq: Every day | ORAL | 1 refills | Status: DC

## 2022-09-04 NOTE — Patient Instructions (Addendum)
Search [email or web address] for a therapist who accepts your insurance. This is your homework for next time I see you.   We are going to switch you from Trulicity to [facility]. This is a related, but slightly more effective medicine. You take it the same way, once a week.   We are also going to give you a medicine called Jardiance. Stop taking your gabapentin.   Stop irbesartan, start amlodipine-olmesartan 5-'40mg'$  daily.  We are giving you your pneumonia vaccine today.   I will see you on Feb 8th at 2pm.   [name], MD

## 2022-09-04 NOTE — Progress Notes (Signed)
SUBJECTIVE:   CHIEF COMPLAINT / HPI:   Weight Loss Efforts Weight is up 8 lbs since our last visit. Historically weight has been as high as 600lbs and as low as 310. 422 lbs. He attributes recent weight gain to inactivity, specifically not bowling 2/2 pain in his knees. Naproxen helps with this pain. He is concerned about the progressive nature of this knee pain and it is weighing on his psyche. We discussed nutrition, he reports a generally healthy diet and decreased appetite since starting Trulicity. At our last visit we discussed transitioning from Trulicity to Mounjaro---at this time he thinks this may be helpful.     F0YO On Trulicity, maximum dose. As above, would like to transition to [facility] for additional weight loss benefit. Also note that he is not currently on an SGLT2i. Renal function at last visit was stable.   HTN BP has been high the past few times he's been here. Currently on irbesartan '150mg'$  daily. Per report, he was previously taking multiple BP agents and had a hypotensive event leading to a fall---will need to take care with uptitrating his regimen.   Depressed Mood PHQ 9 score 12 today, up from 2 at our last visit. Business is going well. Denies any suicidal ideation due to finding meaning in his work, though does have some passive thoughts of being better off dead. Again, a lot of this is coming from his feelings toward his knee pain.   Healthcare Maintenance Willing to get pneumonia vaccine today.    OBJECTIVE:   BP (!) 144/81   Pulse 96   Wt (!) 422 lb (191.4 kg)   SpO2 100%   BMI 63.23 kg/m   Physical Exam Vitals reviewed.  Constitutional:      General: He is not in acute distress. HENT:     Mouth/Throat:     Mouth: Mucous membranes are moist.  Cardiovascular:     Rate and Rhythm: Normal rate and regular rhythm.     Heart sounds: No murmur heard. Pulmonary:     Effort: Pulmonary effort is normal. No respiratory distress.     Breath sounds: No  wheezing.  Musculoskeletal:     Comments: TTP over BL knee joint lines, R>L lateral>medial. No obvious effusion but exam limited by body habitus  Skin:    General: Skin is warm and dry.  Neurological:     General: No focal deficit present.  Psychiatric:     Comments: Mood is depressed      ASSESSMENT/PLAN:   BMI 60.0-69.9, adult (HCC) Unfortunately weight is up 8lbs today. I worry his mood is contributing to recent weight gain and fear he is at risk of catastrophizing his knee pain. I voiced this to him and he agrees that his mood is likely contributing here.  - Referred to psychotherapy through [email or web address]; home health to assist in getting this set up - Transition from Trulicity 4.'5mg'$ /week to Mounjaro--will start with mid-range dose of 7.'5mg'$ /week and titrate up from there - Continue lidoderm patch for knee pain, PRN ibuprofen remains okay. Weight loss will be the best treatment for his knee pain - Will bring back for a joint nutrition visit with me and Dr. Jenne Campus  Type 2 diabetes mellitus with diabetic neuropathy, without long-term current use of insulin (HCC) A1c 6.0% today. Transitioning from Trulicity to [place] as above. Favor adding an SGLT2i for renal and cardiac protection as he is at high risk for both renal and cardiac disease given multiple comorbidities.  -  Mounjaro 7.'5mg'$ /week - Jardiance '10mg'$  daily - Prevnar 20 today  Primary hypertension Uncontrolled on irbesartan '150mg'$  and Toprol XL '50mg'$  daily. - Will stop irbesartan and start amlodipine-olmesartan 5-'40mg'$  daily.  - Continue Toprol XL '50mg'$  daily  Anxiety and depression PHQ-9 12 today--no SI.  - Referral to psychotherapy as above - Hopeful that if we can get a weight loss benefit with [facility] this will be helpful is recapturing his motivation - Will consider pharmacotherapy at follow-up   At risk for polypharmacy Patient favors not increasing total daily pill burden, therefor will make 1-to-1 trade-offs.  He finds gabapentin not helpful, willing to d/c Trade offs as below: - Trulicity>Mounjaro - Irbesartan>amlodipine-olmesartan - Gabapentin>Jardiance  Healthcare maintenance - Prevnar 20 as above     [name], [place]

## 2022-09-05 DIAGNOSIS — Z9189 Other specified personal risk factors, not elsewhere classified: Secondary | ICD-10-CM | POA: Insufficient documentation

## 2022-09-05 DIAGNOSIS — M25561 Pain in right knee: Secondary | ICD-10-CM | POA: Diagnosis not present

## 2022-09-05 MED ORDER — MOUNJARO 7.5 MG/0.5ML ~~LOC~~ SOAJ: 7.5000 mg | SUBCUTANEOUS | 1 refills | Status: DC

## 2022-09-05 NOTE — Assessment & Plan Note (Signed)
PHQ-9 12 today--no SI.  - Referral to psychotherapy as above - Hopeful that if we can get a weight loss benefit with [employer] this will be helpful is recapturing his motivation - Will consider pharmacotherapy at follow-up

## 2022-09-05 NOTE — Assessment & Plan Note (Signed)
Patient favors not increasing total daily pill burden, therefor will make 1-to-1 trade-offs. He finds gabapentin not helpful, willing to d/c Trade offs as below: - Trulicity>Mounjaro - Irbesartan>amlodipine-olmesartan - Gabapentin>Jardiance

## 2022-09-05 NOTE — Assessment & Plan Note (Addendum)
Unfortunately weight is up 8lbs today. I worry his mood is contributing to recent weight gain and fear he is at risk of catastrophizing his knee pain. I voiced this to him and he agrees that his mood is likely contributing here.  - Referred to psychotherapy through [email or web address]; home health to assist in getting this set up - Transition from Trulicity 4.'5mg'$ /week to Mounjaro--will start with mid-range dose of 7.'5mg'$ /week and titrate up from there - Continue lidoderm patch for knee pain, PRN ibuprofen remains okay. Weight loss will be the best treatment for his knee pain - Will bring back for a joint nutrition visit with me and Dr. Jenne Campus

## 2022-09-05 NOTE — Assessment & Plan Note (Addendum)
A1c 6.0% today. Transitioning from Trulicity to [place] as above. Favor adding an SGLT2i for renal and cardiac protection as he is at high risk for both renal and cardiac disease given multiple comorbidities.  - Mounjaro 7.'5mg'$ /week - Jardiance '10mg'$  daily - Prevnar 20 today

## 2022-09-05 NOTE — Assessment & Plan Note (Signed)
-  Prevnar 20 as above

## 2022-09-05 NOTE — Assessment & Plan Note (Signed)
Uncontrolled on irbesartan '150mg'$  and Toprol XL '50mg'$  daily. - Will stop irbesartan and start amlodipine-olmesartan 5-'40mg'$  daily.  - Continue Toprol XL '50mg'$  daily

## 2022-09-06 DIAGNOSIS — M25561 Pain in right knee: Secondary | ICD-10-CM | POA: Diagnosis not present

## 2022-09-07 DIAGNOSIS — M25561 Pain in right knee: Secondary | ICD-10-CM | POA: Diagnosis not present

## 2022-09-11 DIAGNOSIS — M25561 Pain in right knee: Secondary | ICD-10-CM | POA: Diagnosis not present

## 2022-09-12 DIAGNOSIS — M25561 Pain in right knee: Secondary | ICD-10-CM | POA: Diagnosis not present

## 2022-09-13 DIAGNOSIS — M25561 Pain in right knee: Secondary | ICD-10-CM | POA: Diagnosis not present

## 2022-09-14 DIAGNOSIS — M25561 Pain in right knee: Secondary | ICD-10-CM | POA: Diagnosis not present

## 2022-09-18 DIAGNOSIS — M25561 Pain in right knee: Secondary | ICD-10-CM | POA: Diagnosis not present

## 2022-09-19 DIAGNOSIS — M25561 Pain in right knee: Secondary | ICD-10-CM | POA: Diagnosis not present

## 2022-09-20 DIAGNOSIS — M25561 Pain in right knee: Secondary | ICD-10-CM | POA: Diagnosis not present

## 2022-09-21 ENCOUNTER — Ambulatory Visit: Payer: Medicaid Other | Admitting: Family Medicine

## 2022-09-21 DIAGNOSIS — M25561 Pain in right knee: Secondary | ICD-10-CM | POA: Diagnosis not present

## 2022-09-25 ENCOUNTER — Other Ambulatory Visit: Payer: Self-pay | Admitting: Family Medicine

## 2022-09-25 DIAGNOSIS — M25561 Pain in right knee: Secondary | ICD-10-CM | POA: Diagnosis not present

## 2022-09-25 DIAGNOSIS — E119 Type 2 diabetes mellitus without complications: Secondary | ICD-10-CM

## 2022-09-26 DIAGNOSIS — M25561 Pain in right knee: Secondary | ICD-10-CM | POA: Diagnosis not present

## 2022-09-27 DIAGNOSIS — M25561 Pain in right knee: Secondary | ICD-10-CM | POA: Diagnosis not present

## 2022-09-28 ENCOUNTER — Ambulatory Visit: Payer: Medicaid Other | Admitting: General Practice

## 2022-09-28 DIAGNOSIS — M25561 Pain in right knee: Secondary | ICD-10-CM | POA: Diagnosis not present

## 2022-09-29 NOTE — Progress Notes (Deleted)
Cardiology Clinic Note   Patient Name: Craig [name] Date of Encounter: 09/29/2022  Primary Care Provider:  Eppie Gibson, MD Primary Cardiologist:  Donato Heinz, MD  Patient Profile    Craig [name] 62 year old male presents the clinic today for follow-up evaluation of his primary hypertension and hyperlipidemia.  Past Medical History    Past Medical History:  Diagnosis Date   Anemia 12/15/2014   Anxiety and depression 08/17/2017   Bilateral carpal tunnel syndrome 08/03/2016   Cancer ([place]) abdomen   Controlled type 2 diabetes mellitus without complication, without long-term current use of insulin ([place]) 09/17/2012   Well-controlled.  No history of insulin use.   Dizziness 03/27/2012   GERD (gastroesophageal reflux disease) 03/27/2012   History of gastric cancer 02/14/2012   Followed by Dr. Wynetta Emery in [place].    Hyperlipidemia associated with type 2 diabetes mellitus ([place]) 01/01/2013   Morbid obesity with BMI of 50.0-59.9, adult ([place]) 06/05/2012   Onychomycosis 08/27/2018   Orthostatic hypotension 11/10/2019   Perioral numbness 07/30/2018   Poor dentition 12/02/2018   Primary hypertension 04/30/2012   Right hand paresthesia 03/26/2017   Past Surgical History:  Procedure Laterality Date   ABDOMINAL SURGERY      Allergies  No Known Allergies  History of Present Illness    Craig [name] has a PMH of primary hypertension, gastric cancer, GERD, type 2 diabetes, hyperlipidemia, morbid obesity, dizziness, bilateral knee pain, PVCs, anxiety and depression.  He was seen and evaluated by Dr.McIntyre for orthostatic hypotension.  He was seen on 01/07/2020.  His Victoza, gabapentin, valsartan, and carvedilol were discontinued.  He reported having dizziness 3-4 times per week.  The episodes would occur multiple times per day.  He denied syncopal episodes.  He did report occasional palpitations where he felt like his heart was racing.  He denied positional relationship.  His  symptoms improved since stopping the medications.  He was seen by Dr.Schumann 01/25/2021.  He reported that prior to the COVID pandemic he was active mowing 4 times per week and swimming team 3 times per week.  He gained around 8 pounds since the pandemic.  He quit smoking several years ago.  His father had MI at age 97.  His echocardiogram 01/05/2020 showed an LVEF of 55-60%, normal RV function, no significant valvular disease.  He wore a cardiac event monitor for 4 days on 7/21 which showed frequent PVCs he was started on metoprolol XL and was titrated to 50 mg daily.  A follow-up cardiac event monitor 11/08/2020 showed frequent PVCs.  His metoprolol succinate was increased to 50 mg twice daily.  During his 01/25/2021 cardiology visit he was doing better.  He brought in a blood pressure log which showed systolic blood pressures in the 90s-130s over 70s-80s.  He denied chest pain and dyspnea with exertion.  His metoprolol succinate was reduced to 25 mg daily due to lightheadedness.  He denied lightheadedness since the reduced medication.  He denied syncope.  He did note occasional lower extremity swelling.  He has been doing physical therapy and his mobility has improved.  He reported walking daily for about 10 minutes.  He presented to the clinic to 623 for follow-up evaluation stated he felt fairly well.  He continued to notice occasional episodes of dizziness when he was exercising or standing for prolonged periods at times.  He did not notice any dizziness when he was stationary or sedentary.  He was still planning to undergo knee replacement surgery.  He had been told by orthopedics that he needed to lose around 130 pounds.  His blood pressure was well  126/80 range.  He had a home health aide that helped him with daily care 3-4 days/week.  He had been monitoring his diet and staying away from high sodium foods.  He was taking Trulicity.  We reviewed his previous echocardiogram and PVCs.  He continued to  have occasional PVCs.  His EKG showed sinus rhythm with frequent premature ventricular complexes 84 bpm.  We  continued his current medication regimen, gave salty 6 diet sheet, asked him to increase physical activity as tolerated, and planned follow-up for 6 months.  He presented to the clinic 04/03/22 for follow-up evaluation stated he had been doing fairly well.  He continued to try to lose weight.  He continued to have left knee pain.  He was  wearing lidocaine patches.  He had been following with orthopedics who did not recommend cortisone injection at the time.    He felt that his palpitations were better controlled with his metoprolol 50 mg twice daily.  He remained stable from a cardiac standpoint.  He presents to the clinic today for follow-up evaluation and states***   Today he denies chest pain, shortness of breath, lower extremity edema, fatigue, palpitations, melena, hematuria, hemoptysis, diaphoresis, weakness, presyncope, syncope, orthopnea, and PND.   PVCs, palpitations-only notes brief episodes of palpitations.  Well-controlled.  Denies  fatigue,  presyncope or syncope.   Continue metoprolol Heart healthy low-sodium diet Increase physical activity as tolerated Avoid triggers caffeine, chocolate, EtOH, dehydration, etc.-reviewed  Essential hypertension-BP today ***132/85.  PCP recently transitioned from irbesartan to olmesartan with amlodipine.  Reports better blood pressure control.  Continue metoprolol, amlodipine-olmesartan Heart healthy low-sodium diet-salty 6 given Increase physical activity as tolerated Maintain blood pressure log  DOE-denies increased dyspnea.  Continues to lose weight in hopes that he will be able to have knee replacement.  Heart healthy low-sodium diet Increase physical activity as tolerated  Morbid obesity-weight today ***407.8.  Continues to eat calorie restricted diet.  Working with PCP Continue Trulicity Continue weight loss   Follow-up with  Dr.Schumann  in 6-9 months.   Home Medications    Prior to Admission medications   Medication Sig Start Date End Date Taking? Authorizing Provider  ACCU-CHEK GUIDE test strip USE AS INSTRUCTED 09/14/21   Autry-Lott, Naaman Plummer, DO  antiseptic oral rinse (BIOTENE) LIQD 15 mLs by Mouth Rinse route as needed for dry mouth.    [provider]  aspirin (ASPIRIN LOW DOSE) 81 MG EC tablet TAKE 1 TABLET BY MOUTH EVERY DAY 04/07/21   Zenia Resides, MD  atorvastatin (LIPITOR) 80 MG tablet Take 1 tablet (80 mg total) by mouth daily. 04/07/21   Zenia Resides, MD  Blood Glucose Monitoring Suppl (ACCU-CHEK GUIDE) w/Device KIT Inject 1 kit into the skin daily. 07/28/20   Autry-Lott, Naaman Plummer, DO  diclofenac Sodium (VOLTAREN) 1 % GEL Apply 2 g topically 4 (four) times daily. 04/07/21   Zenia Resides, MD  Dulaglutide (TRULICITY) [id number] [id number] SOPN Inject 0.75 mg into the skin once a week. 06/27/21   Autry-Lott, Naaman Plummer, DO  gabapentin (NEURONTIN) 300 MG capsule TAKE 1 CAPSULE BY MOUTH THREE TIMES A DAY 09/14/21   Autry-Lott, Naaman Plummer, DO  irbesartan (AVAPRO) 150 MG tablet TAKE 1 TABLET BY MOUTH EVERYDAY AT BEDTIME 03/21/21   Autry-Lott, Naaman Plummer, DO  Lancets (ACCU-CHEK MULTICLIX) lancets Use as instructed 11/30/20   Lyndee Hensen, DO  metFORMIN (GLUCOPHAGE-XR) 500  MG 24 hr tablet TAKE 1 TABLET BY MOUTH TWICE A DAY 07/25/21   Autry-Lott, Naaman Plummer, DO  metoprolol succinate (TOPROL XL) 50 MG 24 hr tablet Take 1 tablet (50 mg total) by mouth 2 (two) times daily. Patient taking differently: Take 50 mg by mouth 2 (two) times daily. 1/2 tablet at night if dizziness present 11/17/20   Donato Heinz, MD  pantoprazole (PROTONIX) 40 MG tablet TAKE 1 TABLET BY MOUTH EVERY DAY 07/25/21   Autry-Lott, Naaman Plummer, DO  terbinafine (LAMISIL AT) 1 % cream Apply 1 application topically 2 (two) times daily. Patient taking differently: Apply 1 application topically daily. 06/12/19   Autry-Lott, Naaman Plummer, DO    Family History     Family History  Problem Relation Age of Onset   Diabetes type II Mother    He indicated that the status of his mother is unknown.  Social History    Social History   Socioeconomic History   Marital status: Single    Spouse name: Not on file   Number of children: Not on file   Years of education: Not on file   Highest education level: Not on file  Occupational History   Not on file  Tobacco Use   Smoking status: Former    Types: Cigarettes    Start date: 08/14/1977    Quit date: 08/14/1996    Years since quitting: 26.1    Passive exposure: Past   Smokeless tobacco: Never  Vaping Use   Vaping Use: Never used  Substance and Sexual Activity   Alcohol use: No   Drug use: Yes    Types: Cocaine, Marijuana, Heroin    Comment: Previous Hx of substance abuse   Sexual activity: Yes  Other Topics Concern   Not on file  Social History Narrative   Not on file   Social Determinants of Health   Financial Resource Strain: Not on file  Food Insecurity: Not on file  Transportation Needs: Not on file  Physical Activity: Not on file  Stress: Not on file  Social Connections: Not on file  Intimate Partner Violence: Not on file     Review of Systems    General:  No chills, fever, night sweats or weight changes.  Cardiovascular:  No chest pain, dyspnea on exertion, edema, orthopnea, palpitations, paroxysmal nocturnal dyspnea. Dermatological: No rash, lesions/masses Respiratory: No cough, dyspnea Urologic: No hematuria, dysuria Abdominal:   No nausea, vomiting, diarrhea, bright red blood per rectum, melena, or hematemesis Neurologic:  No visual changes, wkns, changes in mental status. All other systems reviewed and are otherwise negative except as noted above.  Physical Exam    VS:  There were no vitals taken for this visit. , BMI There is no height or weight on file to calculate BMI. GEN: Well nourished, well developed, in no acute distress. HEENT: normal. Neck: Supple, no JVD,  carotid bruits, or masses. Cardiac: RRR, no murmurs, rubs, or gallops. No clubbing, cyanosis, edema.  Radials/DP/PT 2+ and equal bilaterally.  Respiratory:  Respirations regular and unlabored, clear to auscultation bilaterally. GI: Soft, nontender, nondistended, BS + x 4. MS: no deformity or atrophy. Skin: warm and dry, no rash. Neuro:  Strength and sensation are intact. Psych: Normal affect.  Accessory Clinical Findings    Recent Labs: 10/05/2021: Hemoglobin 11.1 04/03/2022: BUN 14; Creatinine, Ser 1.04; Potassium 4.3; Sodium 138   Recent Lipid Panel    Component Value Date/Time   CHOL 142 02/01/2021 1438   TRIG 102 02/01/2021 1438  HDL 33 (L) 02/01/2021 1438   CHOLHDL 4.3 02/01/2021 1438   CHOLHDL 4.3 06/16/2015 0921   VLDL 13 06/16/2015 0921   LDLCALC 90 02/01/2021 1438   LDLDIRECT 90 07/13/2017 1606   LDLDIRECT 88 02/04/2016 1115    ECG personally reviewed by me today-***  normal sinus rhythm no ST or T wave deviation 77 bpm  EKG 09/19/2021 sinus rhythm with frequent premature ventricular complexes left atrial enlargement 84 bpm  Echocardiogram 01/05/2020 IMPRESSIONS     1. Left ventricular ejection fraction, by estimation, is 55 to 60%. The  left ventricle has normal function. The left ventricle has no regional  wall motion abnormalities. Left ventricular diastolic parameters were  normal.   2. Right ventricular systolic function is normal. The right ventricular  size is normal. Tricuspid regurgitation signal is inadequate for assessing  PA pressure.   3. The mitral valve is grossly normal. No evidence of mitral valve  regurgitation. No evidence of mitral stenosis.   4. The aortic valve is tricuspid. Aortic valve regurgitation is not  visualized. Mild to moderate aortic valve sclerosis/calcification is  present, without any evidence of aortic stenosis.   5. The inferior vena cava is normal in size with greater than 50%  respiratory variability, suggesting right  atrial pressure of 3 mmHg.  Assessment & Plan   1.  ***   Jossie Ng. Tyberius Ryner NP-C    09/29/2022, 7:35 AM [address] [place] [address] Office [phone] Fax [phone]  Notice: This dictation was prepared with Dragon dictation along with smaller phrase technology. Any transcriptional errors that result from this process are unintentional and may not be corrected upon review.  I spent 14 ***minutes examining this patient, reviewing medications, and using patient centered shared decision making involving her cardiac care.  Prior to her visit I spent greater than 20 minutes reviewing her past medical history,  medications, and prior cardiac tests.

## 2022-10-02 ENCOUNTER — Ambulatory Visit: Payer: Medicaid Other | Attending: General Practice | Admitting: General Practice

## 2022-10-02 DIAGNOSIS — M25561 Pain in right knee: Secondary | ICD-10-CM | POA: Diagnosis not present

## 2022-10-03 DIAGNOSIS — M25561 Pain in right knee: Secondary | ICD-10-CM | POA: Diagnosis not present

## 2022-10-04 DIAGNOSIS — M25561 Pain in right knee: Secondary | ICD-10-CM | POA: Diagnosis not present

## 2022-10-05 DIAGNOSIS — M25561 Pain in right knee: Secondary | ICD-10-CM | POA: Diagnosis not present

## 2022-10-09 DIAGNOSIS — M25561 Pain in right knee: Secondary | ICD-10-CM | POA: Diagnosis not present

## 2022-10-10 DIAGNOSIS — M25561 Pain in right knee: Secondary | ICD-10-CM | POA: Diagnosis not present

## 2022-10-11 DIAGNOSIS — M25561 Pain in right knee: Secondary | ICD-10-CM | POA: Diagnosis not present

## 2022-10-12 ENCOUNTER — Ambulatory Visit: Payer: Medicaid Other | Admitting: Student

## 2022-10-12 VITALS — Wt >= 6400 oz

## 2022-10-12 DIAGNOSIS — Z6841 Body Mass Index (BMI) 40.0 and over, adult: Secondary | ICD-10-CM | POA: Diagnosis present

## 2022-10-12 DIAGNOSIS — M25561 Pain in right knee: Secondary | ICD-10-CM | POA: Diagnosis not present

## 2022-10-12 NOTE — Progress Notes (Signed)
What do you want to get out of meeting with me today? Would like to be more active. Wants to start losing weight again. Feeling down recently around his weight and knee pain.   Relevant history/background:  Adding more veggies, salmon. Chicken went up in price so less of that recently. Drinking glucerna shakes for breakfast x10+ years.  Stopped potatoes. Eating yellow rice instead.  Candy--Superbeets for heart health Stopped potato chips.   Some hypoglycemia in the evenings, lowest was 50. Fasting CBGs 89-100.   Assessment:  On a normal day, how many meals do you have? "1.5" Glucerna shake and then eats biggest meal for dinner. Lighter lunch.   How many snacks do you eat per day? 0-1 during the day. Issues with cookies at nights. 10 cookies once per week.  What beverages do you drink most days of the week? Water 1 gallon, cran-grape 16oz/day.  8oz Pepsis x4 week. What foods do you eat most days of the week? Glucerna. Chicken (baked). Green beans, broccoli, collard greens.  PB&J.   Usual physical activity: [employer] ~2x day, ~10 min, leg strengthening, and some upper body. Sleep: Patient estimates average of 9p-6:30a hours of sleep/night.  24-hr recall:  (Up at  AM) 6:30a got out of bed at 730 B (10 AM)- [place] Sausage 2 patties cooked from frozen    Snk sipped on throughout the day- BCAAs 2 scoops (1 portion, 25-30g protein)   L (1:30 PM)- [place] Gyro portion (20-25%)  D (6 PM)- Baked spaghetti 3 cups, broccoli 3 cups  Snk (9 PM)- 5 cookies   Typical day? Yes.   Other than glucerna for breakfast most other days.   Intervention: Completed diet and exercise history, and established:   1. Go to the bowling alley twice per week.   Reach out to my "son"  Put it on the calendar   2. Eat four cups of vegetables in each day.   Make sure the grocery list reflects this, stock the freezer  Steam them in the microwave   - How will you document progress on goals?  Goals sheet.   For  recommendations and goals, see Patient Instructions.    Follow-up: March 26th  J [name]

## 2022-10-12 NOTE — Patient Instructions (Addendum)
Intervention: Completed diet and exercise history, and established:   1. Go to the bowling alley twice per week.   Reach out to my "son"  Put it on the calendar   2. Eat four cups of vegetables in each day.   Make sure the grocery list reflects this, stock the freezer  Steam them in the microwave   - How will you document progress on goals?  Goals sheet.    I am making a referral to the local [employer] where you may be able to get a discounted membership. Be expecting a phone call from a number you don't recognize.  I will follow-up on your wheelchair for you. I will call you if I learn anything.   Marnee Guarneri, MD

## 2022-10-16 DIAGNOSIS — M25561 Pain in right knee: Secondary | ICD-10-CM | POA: Diagnosis not present

## 2022-10-17 DIAGNOSIS — M25561 Pain in right knee: Secondary | ICD-10-CM | POA: Diagnosis not present

## 2022-10-18 DIAGNOSIS — M25561 Pain in right knee: Secondary | ICD-10-CM | POA: Diagnosis not present

## 2022-10-19 DIAGNOSIS — M25561 Pain in right knee: Secondary | ICD-10-CM | POA: Diagnosis not present

## 2022-10-23 DIAGNOSIS — M25561 Pain in right knee: Secondary | ICD-10-CM | POA: Diagnosis not present

## 2022-10-24 DIAGNOSIS — M25561 Pain in right knee: Secondary | ICD-10-CM | POA: Diagnosis not present

## 2022-10-25 DIAGNOSIS — M25561 Pain in right knee: Secondary | ICD-10-CM | POA: Diagnosis not present

## 2022-10-26 DIAGNOSIS — M25561 Pain in right knee: Secondary | ICD-10-CM | POA: Diagnosis not present

## 2022-10-30 DIAGNOSIS — M25561 Pain in right knee: Secondary | ICD-10-CM | POA: Diagnosis not present

## 2022-10-31 DIAGNOSIS — M25561 Pain in right knee: Secondary | ICD-10-CM | POA: Diagnosis not present

## 2022-11-01 DIAGNOSIS — M25561 Pain in right knee: Secondary | ICD-10-CM | POA: Diagnosis not present

## 2022-11-02 DIAGNOSIS — M25561 Pain in right knee: Secondary | ICD-10-CM | POA: Diagnosis not present

## 2022-11-06 DIAGNOSIS — M25561 Pain in right knee: Secondary | ICD-10-CM | POA: Diagnosis not present

## 2022-11-07 ENCOUNTER — Ambulatory Visit: Payer: Medicaid Other | Admitting: Student

## 2022-11-07 ENCOUNTER — Other Ambulatory Visit: Payer: Self-pay | Admitting: Family Medicine

## 2022-11-07 DIAGNOSIS — M25561 Pain in right knee: Secondary | ICD-10-CM | POA: Diagnosis not present

## 2022-11-08 DIAGNOSIS — M25561 Pain in right knee: Secondary | ICD-10-CM | POA: Diagnosis not present

## 2022-11-09 DIAGNOSIS — M25561 Pain in right knee: Secondary | ICD-10-CM | POA: Diagnosis not present

## 2022-11-10 ENCOUNTER — Telehealth: Payer: Self-pay

## 2022-11-10 ENCOUNTER — Other Ambulatory Visit (HOSPITAL_COMMUNITY): Payer: Self-pay

## 2022-11-10 NOTE — Telephone Encounter (Signed)
A Prior Authorization was initiated & APPROVED for this patients MOUNJARO through CoverMyMeds.   Key: [id number]

## 2022-11-13 DIAGNOSIS — M25561 Pain in right knee: Secondary | ICD-10-CM | POA: Diagnosis not present

## 2022-11-14 DIAGNOSIS — M25561 Pain in right knee: Secondary | ICD-10-CM | POA: Diagnosis not present

## 2022-11-15 DIAGNOSIS — M25561 Pain in right knee: Secondary | ICD-10-CM | POA: Diagnosis not present

## 2022-11-16 DIAGNOSIS — M25561 Pain in right knee: Secondary | ICD-10-CM | POA: Diagnosis not present

## 2022-11-20 DIAGNOSIS — M25561 Pain in right knee: Secondary | ICD-10-CM | POA: Diagnosis not present

## 2022-11-21 DIAGNOSIS — M25561 Pain in right knee: Secondary | ICD-10-CM | POA: Diagnosis not present

## 2022-11-22 DIAGNOSIS — M25561 Pain in right knee: Secondary | ICD-10-CM | POA: Diagnosis not present

## 2022-11-23 DIAGNOSIS — M25561 Pain in right knee: Secondary | ICD-10-CM | POA: Diagnosis not present

## 2022-11-24 ENCOUNTER — Other Ambulatory Visit: Payer: Self-pay | Admitting: Family Medicine

## 2022-11-24 DIAGNOSIS — E1140 Type 2 diabetes mellitus with diabetic neuropathy, unspecified: Secondary | ICD-10-CM

## 2022-11-26 NOTE — Progress Notes (Unsigned)
    SUBJECTIVE:   CHIEF COMPLAINT / HPI:  Craig [name] ICE LOST 18 pounds! Cut back on Sodas.    At her last visit his goal was to go bowling twice per week.  He has***.  His other goal was to eat 4 cups of vegetables each day.  He has***.  We also recently made the transition from Trulicity to [facility].  Currently on the 7.5 mg dose of Mounjaro.  Remains on Jardiance 10 mg daily.  Has been taking Omega XL supplement for his joints and is feeling better.   Trulicity   Has stopped   PERTINENT  PMH / PSH: ***  OBJECTIVE:   There were no vitals taken for this visit.  ***  ASSESSMENT/PLAN:   No problem-specific Assessment & Plan notes found for this encounter.     Eliezer Mccoy, MD [facility]

## 2022-11-27 ENCOUNTER — Encounter: Payer: Self-pay | Admitting: Student

## 2022-11-27 ENCOUNTER — Other Ambulatory Visit: Payer: Self-pay

## 2022-11-27 ENCOUNTER — Ambulatory Visit (INDEPENDENT_AMBULATORY_CARE_PROVIDER_SITE_OTHER): Payer: Medicaid Other | Admitting: Student

## 2022-11-27 VITALS — BP 126/75 | HR 98 | Ht 68.5 in | Wt >= 6400 oz

## 2022-11-27 DIAGNOSIS — Z6841 Body Mass Index (BMI) 40.0 and over, adult: Secondary | ICD-10-CM

## 2022-11-27 DIAGNOSIS — E119 Type 2 diabetes mellitus without complications: Secondary | ICD-10-CM | POA: Diagnosis not present

## 2022-11-27 DIAGNOSIS — M25561 Pain in right knee: Secondary | ICD-10-CM | POA: Diagnosis not present

## 2022-11-27 DIAGNOSIS — E1140 Type 2 diabetes mellitus with diabetic neuropathy, unspecified: Secondary | ICD-10-CM | POA: Diagnosis not present

## 2022-11-27 MED ORDER — MOUNJARO 10 MG/0.5ML ~~LOC~~ SOAJ: 10.0000 mg | SUBCUTANEOUS | 3 refills | Status: DC

## 2022-11-27 MED ORDER — ACCU-CHEK GUIDE VI STRP
ORAL_STRIP | 12 refills | Status: DC
Start: 2022-11-27 — End: 2023-12-06

## 2022-11-27 NOTE — Patient Instructions (Addendum)
You lost 188 pounds! Congratulations! This is a big deal. The veggies and lack of snacks have done you well. Let's build stamina with bowling. I have faxed off the order for the chair. Sorry that so much of the work has fallen to you on bothering us/them.  I'm sorry the mounjaro has been on backorder so long. Let's try a different, slightly higher dose, hopefully this will be easier to get. Call me if you cannot get it.   Let's go bowling. Next Thursday, ~3pm.   Eliezer Mccoy, MD

## 2022-11-28 DIAGNOSIS — M25561 Pain in right knee: Secondary | ICD-10-CM | POA: Diagnosis not present

## 2022-11-29 DIAGNOSIS — M25561 Pain in right knee: Secondary | ICD-10-CM | POA: Diagnosis not present

## 2022-11-29 NOTE — Assessment & Plan Note (Signed)
Weight is down 18lbs---congratulated on success!  Continue dietary interventions as we are having great success. Will plan to bowl 3x weekly going forward. Hoping to meet him at the bowling alley one day as extrinsic motivation. - Will transition to [facility]  weekly based on availability

## 2022-11-30 DIAGNOSIS — M25561 Pain in right knee: Secondary | ICD-10-CM | POA: Diagnosis not present

## 2022-12-04 ENCOUNTER — Other Ambulatory Visit (HOSPITAL_COMMUNITY): Payer: Self-pay

## 2022-12-04 DIAGNOSIS — M25561 Pain in right knee: Secondary | ICD-10-CM | POA: Diagnosis not present

## 2022-12-05 DIAGNOSIS — M25561 Pain in right knee: Secondary | ICD-10-CM | POA: Diagnosis not present

## 2022-12-06 DIAGNOSIS — M25561 Pain in right knee: Secondary | ICD-10-CM | POA: Diagnosis not present

## 2022-12-07 DIAGNOSIS — M25561 Pain in right knee: Secondary | ICD-10-CM | POA: Diagnosis not present

## 2022-12-11 DIAGNOSIS — M25561 Pain in right knee: Secondary | ICD-10-CM | POA: Diagnosis not present

## 2022-12-12 DIAGNOSIS — M25561 Pain in right knee: Secondary | ICD-10-CM | POA: Diagnosis not present

## 2022-12-13 DIAGNOSIS — M25561 Pain in right knee: Secondary | ICD-10-CM | POA: Diagnosis not present

## 2022-12-14 DIAGNOSIS — M25561 Pain in right knee: Secondary | ICD-10-CM | POA: Diagnosis not present

## 2022-12-18 DIAGNOSIS — M25561 Pain in right knee: Secondary | ICD-10-CM | POA: Diagnosis not present

## 2022-12-19 DIAGNOSIS — M25561 Pain in right knee: Secondary | ICD-10-CM | POA: Diagnosis not present

## 2022-12-20 DIAGNOSIS — M25561 Pain in right knee: Secondary | ICD-10-CM | POA: Diagnosis not present

## 2022-12-21 DIAGNOSIS — M25561 Pain in right knee: Secondary | ICD-10-CM | POA: Diagnosis not present

## 2022-12-25 DIAGNOSIS — M25561 Pain in right knee: Secondary | ICD-10-CM | POA: Diagnosis not present

## 2022-12-26 DIAGNOSIS — M25561 Pain in right knee: Secondary | ICD-10-CM | POA: Diagnosis not present

## 2022-12-27 DIAGNOSIS — M25561 Pain in right knee: Secondary | ICD-10-CM | POA: Diagnosis not present

## 2022-12-28 DIAGNOSIS — M25561 Pain in right knee: Secondary | ICD-10-CM | POA: Diagnosis not present

## 2023-01-01 DIAGNOSIS — M25561 Pain in right knee: Secondary | ICD-10-CM | POA: Diagnosis not present

## 2023-01-02 DIAGNOSIS — M25561 Pain in right knee: Secondary | ICD-10-CM | POA: Diagnosis not present

## 2023-01-03 DIAGNOSIS — M25561 Pain in right knee: Secondary | ICD-10-CM | POA: Diagnosis not present

## 2023-01-04 ENCOUNTER — Ambulatory Visit: Payer: Self-pay | Admitting: Student

## 2023-01-04 DIAGNOSIS — M25561 Pain in right knee: Secondary | ICD-10-CM | POA: Diagnosis not present

## 2023-01-09 DIAGNOSIS — M25561 Pain in right knee: Secondary | ICD-10-CM | POA: Diagnosis not present

## 2023-01-10 DIAGNOSIS — M25561 Pain in right knee: Secondary | ICD-10-CM | POA: Diagnosis not present

## 2023-01-11 DIAGNOSIS — M25561 Pain in right knee: Secondary | ICD-10-CM | POA: Diagnosis not present

## 2023-01-15 DIAGNOSIS — M25561 Pain in right knee: Secondary | ICD-10-CM | POA: Diagnosis not present

## 2023-01-16 DIAGNOSIS — M25561 Pain in right knee: Secondary | ICD-10-CM | POA: Diagnosis not present

## 2023-01-17 DIAGNOSIS — M25561 Pain in right knee: Secondary | ICD-10-CM | POA: Diagnosis not present

## 2023-01-18 DIAGNOSIS — M25561 Pain in right knee: Secondary | ICD-10-CM | POA: Diagnosis not present

## 2023-01-22 DIAGNOSIS — M25561 Pain in right knee: Secondary | ICD-10-CM | POA: Diagnosis not present

## 2023-01-23 DIAGNOSIS — M25561 Pain in right knee: Secondary | ICD-10-CM | POA: Diagnosis not present

## 2023-01-24 ENCOUNTER — Other Ambulatory Visit: Payer: Self-pay | Admitting: Student

## 2023-01-24 DIAGNOSIS — M25561 Pain in right knee: Secondary | ICD-10-CM | POA: Diagnosis not present

## 2023-01-24 DIAGNOSIS — E1140 Type 2 diabetes mellitus with diabetic neuropathy, unspecified: Secondary | ICD-10-CM

## 2023-01-25 DIAGNOSIS — M25561 Pain in right knee: Secondary | ICD-10-CM | POA: Diagnosis not present

## 2023-01-29 ENCOUNTER — Other Ambulatory Visit: Payer: Self-pay | Admitting: Student

## 2023-01-29 DIAGNOSIS — M25561 Pain in right knee: Secondary | ICD-10-CM | POA: Diagnosis not present

## 2023-01-29 DIAGNOSIS — I10 Essential (primary) hypertension: Secondary | ICD-10-CM

## 2023-01-30 ENCOUNTER — Ambulatory Visit (INDEPENDENT_AMBULATORY_CARE_PROVIDER_SITE_OTHER): Payer: Medicaid Other | Admitting: Student

## 2023-01-30 ENCOUNTER — Other Ambulatory Visit: Payer: Self-pay | Admitting: Family Medicine

## 2023-01-30 VITALS — BP 117/78 | HR 86 | Ht 68.0 in | Wt >= 6400 oz

## 2023-01-30 DIAGNOSIS — E1140 Type 2 diabetes mellitus with diabetic neuropathy, unspecified: Secondary | ICD-10-CM

## 2023-01-30 DIAGNOSIS — G8929 Other chronic pain: Secondary | ICD-10-CM | POA: Diagnosis not present

## 2023-01-30 DIAGNOSIS — M25562 Pain in left knee: Secondary | ICD-10-CM | POA: Diagnosis not present

## 2023-01-30 DIAGNOSIS — M25561 Pain in right knee: Secondary | ICD-10-CM

## 2023-01-30 LAB — POCT GLYCOSYLATED HEMOGLOBIN (HGB A1C): HbA1c, POC (controlled diabetic range): 6 % (ref 0.0–7.0)

## 2023-01-30 MED ORDER — MOUNJARO 15 MG/0.5ML ~~LOC~~ SOAJ
15.0000 mg | SUBCUTANEOUS | 3 refills | Status: DC
Start: 2023-01-30 — End: 2023-05-15

## 2023-01-30 NOTE — Patient Instructions (Addendum)
Your A1c is 6.0% same as it has been, this is good! Now, I do think we can go up on the Mounjaro dose to drive more weight loss AND to have a better chance of actually getting it.   Think about a colonoscopy--yes your cologuard was negative but this is an imperfect test.  I think you'd benefit from a knee injection. I think it would help you get more active, give it some thought and call me for an appointment.  Call if you have trouble getting the [facility].   Eliezer Mccoy, MD

## 2023-01-30 NOTE — Progress Notes (Unsigned)
    SUBJECTIVE:   CHIEF COMPLAINT / HPI:   Wheelchair He was told to expect it in 6-8 weeks by the agency. As best we can tell all the paperwork I need to do is done.   Diabetes A1c stable at 6.0. Pharmacy issues with getting Mounjaro. Have been bouncing back and forth between Trulicity and Mounjaro for the past several weeks but recently has been out.  Hasn't been exercising as much recently due to issues with "mind."  Has been taking some herbal/vegetable supplements.   Bilateral Knee Pain L is worse than right. Last XR in 2022. With Left  AP lateral sunrise views of the left knee are ordered interpreted by me in  the office today.  Impression: Patient has advanced varus deformity with  complete loss of joint space in the medial compartment with degenerative  changes in the lateral femoral condyle and lateral tibial plateau with  severe patellofemoral osteoarthritis.  No evidence of acute bony  abnormality or abnormal bony lesions.   R Knee AP lateral sunrise views of the right knee are ordered interpreted by me  in the office today.  Impression: Patient has complete loss of joint space  in the medial compartment with significant varus deformity.  Severe  degenerative changes along the lateral femoral condyle and lateral tibial  plateau with severe patellofemoral osteoarthritis.  No evidence of acute  bony abnormality or abnormal bony lesions.   Declines.   PERTINENT  PMH / PSH: ***  OBJECTIVE:   Ht 5\' 8"  (1.727 m)   Wt (!) 411 lb 8 oz (186.7 kg)   BMI 62.57 kg/m   ***  ASSESSMENT/PLAN:   No problem-specific Assessment & Plan notes found for this encounter.     Eliezer Mccoy, MD [facility]

## 2023-01-31 DIAGNOSIS — M25561 Pain in right knee: Secondary | ICD-10-CM | POA: Diagnosis not present

## 2023-01-31 NOTE — Assessment & Plan Note (Signed)
Remains well-controlled despite discontinuation of metformin. Unfortunately just took his last dose of Trulicity last Thursday and is currently without a supply of either Trulicity or Mounjaro. The highest dose of Mounjaro that he has taken to date was 10mg /week. Could trial 15mg /week if his pharmacy is able to get this in stock. While his A1c is well-controlled, he would benefit from the additional weight loss benefit.  - Is on a statin. - Continue Jardiance 10mg  daily - Mounjaro 15mg /week

## 2023-01-31 NOTE — Assessment & Plan Note (Signed)
Radiographic evidence of OA and corroborating exam. Not a candidate for surgery, was evaluated by [facility] a while back. Offered steroid injection today but he declines.  - He will call back if he decides he wants an injection - Continue lidocaine patches

## 2023-02-01 DIAGNOSIS — M25561 Pain in right knee: Secondary | ICD-10-CM | POA: Diagnosis not present

## 2023-02-05 DIAGNOSIS — M25561 Pain in right knee: Secondary | ICD-10-CM | POA: Diagnosis not present

## 2023-02-06 DIAGNOSIS — M25561 Pain in right knee: Secondary | ICD-10-CM | POA: Diagnosis not present

## 2023-02-07 DIAGNOSIS — G8929 Other chronic pain: Secondary | ICD-10-CM | POA: Diagnosis not present

## 2023-02-07 DIAGNOSIS — M25561 Pain in right knee: Secondary | ICD-10-CM | POA: Diagnosis not present

## 2023-02-07 DIAGNOSIS — M25562 Pain in left knee: Secondary | ICD-10-CM | POA: Diagnosis not present

## 2023-02-08 ENCOUNTER — Other Ambulatory Visit: Payer: Self-pay | Admitting: Cardiology

## 2023-02-08 DIAGNOSIS — M25561 Pain in right knee: Secondary | ICD-10-CM | POA: Diagnosis not present

## 2023-02-12 DIAGNOSIS — M25561 Pain in right knee: Secondary | ICD-10-CM | POA: Diagnosis not present

## 2023-02-13 ENCOUNTER — Other Ambulatory Visit: Payer: Self-pay | Admitting: Student

## 2023-02-13 DIAGNOSIS — M25561 Pain in right knee: Secondary | ICD-10-CM | POA: Diagnosis not present

## 2023-02-14 DIAGNOSIS — M25561 Pain in right knee: Secondary | ICD-10-CM | POA: Diagnosis not present

## 2023-02-15 DIAGNOSIS — M25561 Pain in right knee: Secondary | ICD-10-CM | POA: Diagnosis not present

## 2023-02-19 DIAGNOSIS — M25561 Pain in right knee: Secondary | ICD-10-CM | POA: Diagnosis not present

## 2023-02-20 ENCOUNTER — Other Ambulatory Visit: Payer: Self-pay | Admitting: Student

## 2023-02-20 DIAGNOSIS — M25561 Pain in right knee: Secondary | ICD-10-CM | POA: Diagnosis not present

## 2023-02-21 DIAGNOSIS — M25561 Pain in right knee: Secondary | ICD-10-CM | POA: Diagnosis not present

## 2023-02-22 DIAGNOSIS — M25561 Pain in right knee: Secondary | ICD-10-CM | POA: Diagnosis not present

## 2023-02-26 DIAGNOSIS — M25561 Pain in right knee: Secondary | ICD-10-CM | POA: Diagnosis not present

## 2023-02-27 DIAGNOSIS — M25561 Pain in right knee: Secondary | ICD-10-CM | POA: Diagnosis not present

## 2023-02-28 DIAGNOSIS — M25561 Pain in right knee: Secondary | ICD-10-CM | POA: Diagnosis not present

## 2023-03-01 DIAGNOSIS — M25561 Pain in right knee: Secondary | ICD-10-CM | POA: Diagnosis not present

## 2023-03-05 DIAGNOSIS — M25561 Pain in right knee: Secondary | ICD-10-CM | POA: Diagnosis not present

## 2023-03-06 DIAGNOSIS — M25561 Pain in right knee: Secondary | ICD-10-CM | POA: Diagnosis not present

## 2023-03-07 ENCOUNTER — Telehealth: Payer: Self-pay | Admitting: Student

## 2023-03-07 DIAGNOSIS — M25561 Pain in right knee: Secondary | ICD-10-CM | POA: Diagnosis not present

## 2023-03-07 NOTE — Telephone Encounter (Signed)
Patient dropped off [employer] [place] forms to be completed. Last DOS was 01/30/23. Placed in [employer].

## 2023-03-08 DIAGNOSIS — M25561 Pain in right knee: Secondary | ICD-10-CM | POA: Diagnosis not present

## 2023-03-08 NOTE — Telephone Encounter (Signed)
Form has been placed in your box to be completed, please finish when you have time. Thank you! Dayshia Ottley CMA  

## 2023-03-12 DIAGNOSIS — M25561 Pain in right knee: Secondary | ICD-10-CM | POA: Diagnosis not present

## 2023-03-13 DIAGNOSIS — M25561 Pain in right knee: Secondary | ICD-10-CM | POA: Diagnosis not present

## 2023-03-14 DIAGNOSIS — M25561 Pain in right knee: Secondary | ICD-10-CM | POA: Diagnosis not present

## 2023-03-15 DIAGNOSIS — M25561 Pain in right knee: Secondary | ICD-10-CM | POA: Diagnosis not present

## 2023-03-15 NOTE — Telephone Encounter (Signed)
Patient called and informed that forms are ready for pick up. Copy made and placed in batch scanning. Original placed at front desk for pick up.   Hannah C Pipkin, RN  

## 2023-03-19 ENCOUNTER — Other Ambulatory Visit: Payer: Self-pay | Admitting: Student

## 2023-03-19 ENCOUNTER — Ambulatory Visit: Payer: Medicaid Other | Admitting: Student

## 2023-03-19 DIAGNOSIS — M25561 Pain in right knee: Secondary | ICD-10-CM | POA: Diagnosis not present

## 2023-03-20 DIAGNOSIS — M25561 Pain in right knee: Secondary | ICD-10-CM | POA: Diagnosis not present

## 2023-03-21 DIAGNOSIS — M25561 Pain in right knee: Secondary | ICD-10-CM | POA: Diagnosis not present

## 2023-03-22 DIAGNOSIS — M25561 Pain in right knee: Secondary | ICD-10-CM | POA: Diagnosis not present

## 2023-03-26 DIAGNOSIS — M25561 Pain in right knee: Secondary | ICD-10-CM | POA: Diagnosis not present

## 2023-03-27 DIAGNOSIS — M25561 Pain in right knee: Secondary | ICD-10-CM | POA: Diagnosis not present

## 2023-03-28 DIAGNOSIS — M25561 Pain in right knee: Secondary | ICD-10-CM | POA: Diagnosis not present

## 2023-03-29 DIAGNOSIS — M25561 Pain in right knee: Secondary | ICD-10-CM | POA: Diagnosis not present

## 2023-04-02 DIAGNOSIS — M25561 Pain in right knee: Secondary | ICD-10-CM | POA: Diagnosis not present

## 2023-04-03 DIAGNOSIS — M25561 Pain in right knee: Secondary | ICD-10-CM | POA: Diagnosis not present

## 2023-04-04 DIAGNOSIS — M25561 Pain in right knee: Secondary | ICD-10-CM | POA: Diagnosis not present

## 2023-04-04 NOTE — Progress Notes (Signed)
Cardiology Office Note:  .   Date:  04/11/2023  ID:  Craig [name], DOB 12-23-1960, MRN [id number] PCP: Alicia Amel, MD  [facility] Providers Cardiologist:  Little Ishikawa, MD   History of Present Illness: .   Craig [name] is a 62 y.o. male with past medical history of orthostatic hypotension, primary hypertension, PVCs, gastric cancer, GERD, hyperlipidemia, type 2 diabetes.  Patient is followed by Dr. Bjorn Pippin and presents today for an annual follow up.   Per chart review, patient had episodes of orthostatic hypotension in 12/2019.  At that time, he reported that he would have dizziness about 3-4 times per week that would occur multiple times per day.  His Victoza, gabapentin, valsartan, and carvedilol were discontinued in response.  He underwent echocardiogram on 01/05/2020 that showed EF 55-60%, no regional wall motion abnormalities, normal RV function, aortic sclerosis without stenosis.He was seen by Dr. Gaynelle Arabian in 01/2021.  At that time, he also complained of palpitations.  Wore a cardiac monitor that showed frequent PVCs (12.1% burden).  He was started on metoprolol succinate 50 mg daily.  He again wore a cardiac monitor in 10/2020 that showed frequent PVCs (10% burden).  His metoprolol succinate was increased to 50 mg twice daily.  Patient was last seen by cardiology on 04/03/2022.  At that time, patient continued to notice occasional episodes of dizziness when he was exercising or standing for prolonged periods of times.  He did not notice dizziness when stationary or sedentary.  He was planning to undergo knee replacement surgery, but had been told by orthopedics that he needed to lose around the 130 pounds.  He did feel that his palpitations were better controlled on the increased dose of metoprolol and he was encouraged to avoid triggers including caffeine, chocolate, alcohol, dehydration.  On interview today, patient denies specific cardiac concerns. He is planning to start  going to [facility] for exercise. He recently got an electronic chair to help him get around. Activity is limited by knee pain. Denies palpitations, worsening shortness of breath, chest pain. Reports that his dizziness improved when his medications were changed. No syncope, near syncope. He is currently on mounjaro, but sometimes has a hard time filling it at the pharmacy. Is trying to lose weight   ROS: Denies chest pain, syncope, near syncope, shortness of breath, palpitations. Does have bilateral knee pain   Studies Reviewed: .   Cardiac Studies & Procedures       ECHOCARDIOGRAM  ECHOCARDIOGRAM COMPLETE 01/05/2020  Narrative ECHOCARDIOGRAM REPORT    Patient Name:   Craig [name] Date of Exam: 01/05/2020 Medical Rec #:  [id number]     Height:       70.0 in Accession #:    [id number]    Weight:       392.0 lb Date of Birth:  August 22, 1960     BSA:          2.778 m Patient Age:    59 years      BP:           151/87 mmHg Patient Gender: M             HR:           76 bpm. Exam Location:  Inpatient  Procedure: 2D Echo, Cardiac Doppler and Color Doppler  Indications:    Orthostatic hypotension Postural dizziness with presyncope  History:        Patient has prior history of Echocardiogram examinations, most  recent 02/12/2012. Risk Factors:Former Smoker, Diabetes and Dyslipidemia.  Sonographer:    Ross Ludwig RDCS (AE) Referring Phys: [id number] CARINA M BROWN  IMPRESSIONS   1. Left ventricular ejection fraction, by estimation, is 55 to 60%. The left ventricle has normal function. The left ventricle has no regional wall motion abnormalities. Left ventricular diastolic parameters were normal. 2. Right ventricular systolic function is normal. The right ventricular size is normal. Tricuspid regurgitation signal is inadequate for assessing PA pressure. 3. The mitral valve is grossly normal. No evidence of mitral valve regurgitation. No evidence of mitral stenosis. 4. The aortic valve is  tricuspid. Aortic valve regurgitation is not visualized. Mild to moderate aortic valve sclerosis/calcification is present, without any evidence of aortic stenosis. 5. The inferior vena cava is normal in size with greater than 50% respiratory variability, suggesting right atrial pressure of 3 mmHg.  FINDINGS Left Ventricle: Left ventricular ejection fraction, by estimation, is 55 to 60%. The left ventricle has normal function. The left ventricle has no regional wall motion abnormalities. The left ventricular internal cavity size was normal in size. There is no concentric left ventricular hypertrophy. Left ventricular diastolic parameters were normal. Normal left ventricular filling pressure.  Right Ventricle: The right ventricular size is normal. No increase in right ventricular wall thickness. Right ventricular systolic function is normal. Tricuspid regurgitation signal is inadequate for assessing PA pressure.  Left Atrium: Left atrial size was normal in size.  Right Atrium: Right atrial size was normal in size.  Pericardium: There is no evidence of pericardial effusion.  Mitral Valve: The mitral valve is grossly normal. Mild mitral annular calcification. No evidence of mitral valve regurgitation. No evidence of mitral valve stenosis. MV peak gradient, 3.1 mmHg. The mean mitral valve gradient is 1.0 mmHg.  Tricuspid Valve: The tricuspid valve is grossly normal. Tricuspid valve regurgitation is not demonstrated. No evidence of tricuspid stenosis.  Aortic Valve: The aortic valve is tricuspid. Aortic valve regurgitation is not visualized. Mild to moderate aortic valve sclerosis/calcification is present, without any evidence of aortic stenosis. There is mild calcification of the aortic valve. Aortic valve mean gradient measures 4.0 mmHg. Aortic valve peak gradient measures 7.6 mmHg. Aortic valve area, by VTI measures 2.06 cm.  Pulmonic Valve: The pulmonic valve was grossly normal. Pulmonic valve  regurgitation is not visualized. No evidence of pulmonic stenosis.  Aorta: The aortic root and ascending aorta are structurally normal, with no evidence of dilitation.  Venous: The inferior vena cava is normal in size with greater than 50% respiratory variability, suggesting right atrial pressure of 3 mmHg.  IAS/Shunts: The atrial septum is grossly normal.  EKG: Rhythm strip during this exam demostrated normal sinus rhythm and premature ventricular contractions.   LEFT VENTRICLE PLAX 2D LVIDd:         5.10 cm  Diastology LVIDs:         3.50 cm  LV e' lateral:   12.50 cm/s LV PW:         1.00 cm  LV E/e' lateral: 5.9 LV IVS:        0.87 cm  LV e' medial:    9.68 cm/s LVOT diam:     2.00 cm  LV E/e' medial:  7.7 LV SV:         59 LV SV Index:   21 LVOT Area:     3.14 cm   RIGHT VENTRICLE             IVC RV Basal diam:  3.20  cm     IVC diam: 1.80 cm RV S prime:     11.00 cm/s TAPSE (M-mode): 2.3 cm  LEFT ATRIUM             Index       RIGHT ATRIUM           Index LA diam:        3.60 cm 1.30 cm/m  RA Area:     12.10 cm LA Vol (A2C):   58.5 ml 21.06 ml/m RA Volume:   24.80 ml  8.93 ml/m LA Vol (A4C):   92.4 ml 33.26 ml/m LA Biplane Vol: 81.4 ml 29.30 ml/m AORTIC VALVE AV Area (Vmax):    2.20 cm AV Area (Vmean):   2.04 cm AV Area (VTI):     2.06 cm AV Vmax:           138.00 cm/s AV Vmean:          98.200 cm/s AV VTI:            0.287 m AV Peak Grad:      7.6 mmHg AV Mean Grad:      4.0 mmHg LVOT Vmax:         96.60 cm/s LVOT Vmean:        63.800 cm/s LVOT VTI:          0.188 m LVOT/AV VTI ratio: 0.66  AORTA Ao Root diam: 3.10 cm Ao Asc diam:  3.30 cm  MITRAL VALVE MV Area (PHT): 3.42 cm    SHUNTS MV Peak grad:  3.1 mmHg    Systemic VTI:  0.19 m MV Mean grad:  1.0 mmHg    Systemic Diam: 2.00 cm MV Vmax:       0.88 m/s MV Vmean:      49.7 cm/s MV Decel Time: 222 msec MV E velocity: 74.10 cm/s MV A velocity: 46.30 cm/s MV E/A ratio:  1.60  Lennie Odor MD Electronically signed by Lennie Odor MD Signature Date/Time: 01/05/2020/4:12:29 PM    Final    MONITORS  LONG TERM MONITOR (3-14 DAYS) 11/08/2020  Narrative  Frequent PVCs (10.1% of beats)   Patch Wear Time:  1 days and 20 hours (2022-03-19T14:33:12-398 to 2022-03-21T10:45:00-0400)  Patient had a min HR of 46 bpm, max HR of 111 bpm, and avg HR of 76 bpm. Predominant underlying rhythm was Sinus Rhythm. Slight P wave morphology changes were noted, consider possible Ectopic Atrial Rhythm. Isolated SVEs were rare (<1.0%), SVE Couplets were rare (<1.0%), and no SVE Triplets were present. Isolated VEs were frequent (10.1%, 19948), VE Couplets were rare (<1.0%, 49), and no VE Triplets were present. Ventricular Bigeminy and Trigeminy were present.  No patient triggered events.           Risk Assessment/Calculations:             Physical Exam:   VS:  BP 138/82 (BP Location: Left Wrist, Patient Position: Sitting, Cuff Size: Large)   Pulse 70   Ht 5\' 9"  (1.753 m)   Wt (!) 407 lb 3.2 oz (184.7 kg)   SpO2 97%   BMI 60.13 kg/m    Wt Readings from Last 3 Encounters:  04/11/23 (!) 407 lb 3.2 oz (184.7 kg)  01/30/23 (!) 411 lb 8 oz (186.7 kg)  11/27/22 (!) 407 lb 3.2 oz (184.7 kg)    GEN: Morbidly obese middle aged male, sitting comfortably in the chair in no acute distress  NECK: No JVD; No carotid bruits  CARDIAC: RRR, no murmurs, rubs, gallops. Radial pulses 2+ bilaterally  RESPIRATORY:  Clear to auscultation without rales, wheezing or rhonchi. Normal work of breathing on room air  ABDOMEN: Soft, non-tender, non-distended EXTREMITIES:  No edema; No deformity. Wearing compression stockings   ASSESSMENT AND PLAN: .    Frequent PVCs -Patient's most recent cardiac monitor from 10/2020 showed frequent PVCs (10% burden).   -Echocardiogram from 12/2019 showed EF 55-60%, no regional wall motion abnormalities - He denies palpitations and is unaware of PVCs when they occur. He did  have 2 PVCs on his EKG today in clinic. Discussed ordering cardiac monitor to reassess PVC burden, but patient declined  -Currently on metoprolol succinate 50 mg twice daily- continue  -Discussed sleep study for OSA-- patient declined at this time -Reviewed PVC triggers including caffeine, chocolate, alcohol, dehydration, stress. Encouraged him to increase his physical activity as tolerated and to work on weight loss   Hypertension - BP well controlled in clinic today  -Continue amlodipine-olmesartan 5-40 mg daily, metoprolol succinate 50 mg twice daily - He has an appointment with his PCP on 9/3- reports that his PCP has been monitoring his labs on olmesartan   Morbid obesity - Patient plans on joining [employer] to increase his physical activity. I encouraged increasing exercise as tolerated  - Patient on Mounjaro per PCP   Type 2 diabetes -Followed by PCP-A1c has been stable at 6.0. -On Jardiance, mounjaro    Dispo: Follow up in 6 months   Signed, Jonita Albee, PA-C

## 2023-04-05 DIAGNOSIS — M25561 Pain in right knee: Secondary | ICD-10-CM | POA: Diagnosis not present

## 2023-04-06 ENCOUNTER — Other Ambulatory Visit: Payer: Self-pay | Admitting: Cardiology

## 2023-04-09 DIAGNOSIS — M25561 Pain in right knee: Secondary | ICD-10-CM | POA: Diagnosis not present

## 2023-04-10 DIAGNOSIS — M25561 Pain in right knee: Secondary | ICD-10-CM | POA: Diagnosis not present

## 2023-04-11 ENCOUNTER — Ambulatory Visit: Payer: Medicaid Other | Attending: Cardiology | Admitting: Cardiology

## 2023-04-11 ENCOUNTER — Encounter: Payer: Self-pay | Admitting: Cardiology

## 2023-04-11 VITALS — BP 138/82 | HR 70 | Ht 69.0 in | Wt >= 6400 oz

## 2023-04-11 DIAGNOSIS — E1140 Type 2 diabetes mellitus with diabetic neuropathy, unspecified: Secondary | ICD-10-CM

## 2023-04-11 DIAGNOSIS — I10 Essential (primary) hypertension: Secondary | ICD-10-CM | POA: Diagnosis not present

## 2023-04-11 DIAGNOSIS — M25561 Pain in right knee: Secondary | ICD-10-CM | POA: Diagnosis not present

## 2023-04-11 DIAGNOSIS — I493 Ventricular premature depolarization: Secondary | ICD-10-CM | POA: Diagnosis not present

## 2023-04-11 NOTE — Patient Instructions (Addendum)
Medication Instructions:  Your physician recommends that you continue on your current medications as directed. Please refer to the Current Medication list given to you today.    *If you need a refill on your cardiac medications before your next appointment, please call your pharmacy*  Lab Work: If you have labs (blood work) drawn today and your tests are completely normal, you will receive your results only by: MyChart Message (if you have MyChart) OR A paper copy in the mail If you have any lab test that is abnormal or we need to change your treatment, we will call you to review the results.   Follow-Up: At [facility], you and your health needs are our priority.  As part of our continuing mission to provide you with exceptional heart care, we have created designated Provider Care Teams.  These Care Teams include your primary Cardiologist (physician) and Advanced Practice Providers (APPs -  Physician Assistants and Nurse Practitioners) who all work together to provide you with the care you need, when you need it.  We recommend signing up for the patient portal called "MyChart".  Sign up information is provided on this After Visit Summary.  MyChart is used to connect with patients for Virtual Visits (Telemedicine).  Patients are able to view lab/test results, encounter notes, upcoming appointments, etc.  Non-urgent messages can be sent to your provider as well.   To learn more about what you can do with MyChart, go to [email or web address].    Your next appointment:   6 month(s)  Provider:             Any available APP

## 2023-04-12 DIAGNOSIS — M25561 Pain in right knee: Secondary | ICD-10-CM | POA: Diagnosis not present

## 2023-04-16 DIAGNOSIS — M25561 Pain in right knee: Secondary | ICD-10-CM | POA: Diagnosis not present

## 2023-04-17 ENCOUNTER — Ambulatory Visit: Payer: Medicaid Other | Admitting: Student

## 2023-04-17 DIAGNOSIS — M25561 Pain in right knee: Secondary | ICD-10-CM | POA: Diagnosis not present

## 2023-04-18 DIAGNOSIS — M25561 Pain in right knee: Secondary | ICD-10-CM | POA: Diagnosis not present

## 2023-04-19 DIAGNOSIS — M25561 Pain in right knee: Secondary | ICD-10-CM | POA: Diagnosis not present

## 2023-04-23 DIAGNOSIS — M25561 Pain in right knee: Secondary | ICD-10-CM | POA: Diagnosis not present

## 2023-04-24 ENCOUNTER — Telehealth (INDEPENDENT_AMBULATORY_CARE_PROVIDER_SITE_OTHER): Payer: Medicaid Other | Admitting: Student

## 2023-04-24 DIAGNOSIS — I10 Essential (primary) hypertension: Secondary | ICD-10-CM

## 2023-04-24 DIAGNOSIS — M25561 Pain in right knee: Secondary | ICD-10-CM | POA: Diagnosis not present

## 2023-04-24 DIAGNOSIS — E1140 Type 2 diabetes mellitus with diabetic neuropathy, unspecified: Secondary | ICD-10-CM

## 2023-04-24 NOTE — Progress Notes (Unsigned)
    SUBJECTIVE:  Virtual Visit via Video Note  I connected with Craig [name] on 04/25/23 at  2:50 PM EDT by a video enabled telemedicine application and verified that I am speaking with the correct person using two identifiers.  Location: Patient: His home in [place], [place] Provider: [facility]   I discussed the limitations of evaluation and management by telemedicine and the availability of in person appointments. The patient expressed understanding and agreed to proceed.   Dizziness  Hypoglycemic event DM2 Acute episode this morning. First time occurrence. Got dizzy upon getting out of his chair to get bathed this morning. Checked his glucose and it was 60. Fasting this am was also lower than usual at 85. Drank some Coca-cola with appropriate recovery in his BP. Also took his BP with this event and it was appropriate at 112/95. Mentating well now. For his diabetes, he is supposed to be on Mounjaro 15mg  weekly, but has actually not been due to pharmacy supply issues. Thinks it will be back in stock shortly.  He does take Jardiance 10mg  daily.   HTN Seen by [facility] two weeks ago. BP was well-controlled. Currently on amlodipine-olmesartan 5-40mg  daily and metoprolol succinate 50mg  BID. Remains well-controlled as above.    OBJECTIVE:   There were no vitals taken for this visit.  Patient appears well on video. Sitting in his wheelchair, NAD. Speaking in full sentences and breathing comfortably on room air.    ASSESSMENT/PLAN:   Type 2 diabetes mellitus with diabetic neuropathy, without long-term current use of insulin (HCC) Symptomatic hypoglycemic event today. Suspect perhaps driven by his Jardiance given that he hasn't had access to his Gregg[employer] Keen recently. Reassured that his CBG came up appropriately with drinking some Coke.  - Hold Jardiace and Mounjaro for now - Bringing him in for an in-person visit, unfortunately getting here is a bit of challenge for him as he has to arrange SCAT so  the soonest we were able to schedule this was into next month. He'll come earlier if he's able.  - Return ASAP if symptoms recur without identified trigger   Primary hypertension Remains quite well-controlled. Continue current management. Based on BP reading at the time of event, I do not believe that his blood pressure was playing into his episode earlier today.      I discussed the assessment and treatment plan with the patient. The patient was provided an opportunity to ask questions and all were answered. The patient agreed with the plan and demonstrated an understanding of the instructions.   The patient was advised to call back or seek an in-person evaluation if the symptoms worsen or if the condition fails to improve as anticipated.  I provided 16 minutes of non-face-to-face time during this encounter.  Eliezer Mccoy, MD [facility]

## 2023-04-25 DIAGNOSIS — M25561 Pain in right knee: Secondary | ICD-10-CM | POA: Diagnosis not present

## 2023-04-25 NOTE — Assessment & Plan Note (Signed)
Symptomatic hypoglycemic event today. Suspect perhaps driven by his Jardiance given that he hasn't had access to his [employer] Keen recently. Reassured that his CBG came up appropriately with drinking some Coke.  - Hold Jardiace and Mounjaro for now - Bringing him in for an in-person visit, unfortunately getting here is a bit of challenge for him as he has to arrange SCAT.

## 2023-04-26 DIAGNOSIS — M25561 Pain in right knee: Secondary | ICD-10-CM | POA: Diagnosis not present

## 2023-04-26 NOTE — Assessment & Plan Note (Signed)
Remains quite well-controlled. Continue current management. Based on BP reading at the time of event, I do not believe that his blood pressure was playing into his episode earlier today.

## 2023-04-30 DIAGNOSIS — M25561 Pain in right knee: Secondary | ICD-10-CM | POA: Diagnosis not present

## 2023-05-01 DIAGNOSIS — M25561 Pain in right knee: Secondary | ICD-10-CM | POA: Diagnosis not present

## 2023-05-02 DIAGNOSIS — M25561 Pain in right knee: Secondary | ICD-10-CM | POA: Diagnosis not present

## 2023-05-03 DIAGNOSIS — M25561 Pain in right knee: Secondary | ICD-10-CM | POA: Diagnosis not present

## 2023-05-06 ENCOUNTER — Other Ambulatory Visit: Payer: Self-pay | Admitting: Student

## 2023-05-06 DIAGNOSIS — E1140 Type 2 diabetes mellitus with diabetic neuropathy, unspecified: Secondary | ICD-10-CM

## 2023-05-07 DIAGNOSIS — M25561 Pain in right knee: Secondary | ICD-10-CM | POA: Diagnosis not present

## 2023-05-08 DIAGNOSIS — M25561 Pain in right knee: Secondary | ICD-10-CM | POA: Diagnosis not present

## 2023-05-09 DIAGNOSIS — M25561 Pain in right knee: Secondary | ICD-10-CM | POA: Diagnosis not present

## 2023-05-10 DIAGNOSIS — M25561 Pain in right knee: Secondary | ICD-10-CM | POA: Diagnosis not present

## 2023-05-14 DIAGNOSIS — M25561 Pain in right knee: Secondary | ICD-10-CM | POA: Diagnosis not present

## 2023-05-15 ENCOUNTER — Encounter: Payer: Self-pay | Admitting: Student

## 2023-05-15 ENCOUNTER — Ambulatory Visit: Payer: Medicaid Other | Admitting: Student

## 2023-05-15 ENCOUNTER — Other Ambulatory Visit: Payer: Self-pay

## 2023-05-15 VITALS — BP 122/76 | HR 96 | Ht 69.0 in | Wt >= 6400 oz

## 2023-05-15 DIAGNOSIS — I10 Essential (primary) hypertension: Secondary | ICD-10-CM

## 2023-05-15 DIAGNOSIS — Z23 Encounter for immunization: Secondary | ICD-10-CM | POA: Diagnosis not present

## 2023-05-15 DIAGNOSIS — I951 Orthostatic hypotension: Secondary | ICD-10-CM | POA: Diagnosis not present

## 2023-05-15 DIAGNOSIS — Z7984 Long term (current) use of oral hypoglycemic drugs: Secondary | ICD-10-CM

## 2023-05-15 DIAGNOSIS — E1140 Type 2 diabetes mellitus with diabetic neuropathy, unspecified: Secondary | ICD-10-CM

## 2023-05-15 DIAGNOSIS — R42 Dizziness and giddiness: Secondary | ICD-10-CM

## 2023-05-15 DIAGNOSIS — M25561 Pain in right knee: Secondary | ICD-10-CM | POA: Diagnosis not present

## 2023-05-15 LAB — POCT GLYCOSYLATED HEMOGLOBIN (HGB A1C): HbA1c, POC (controlled diabetic range): 6 % (ref 0.0–7.0)

## 2023-05-15 LAB — GLUCOSE, POCT (MANUAL RESULT ENTRY): POC Glucose: 97 mg/dL (ref 70–99)

## 2023-05-15 MED ORDER — AMLODIPINE-OLMESARTAN 5-20 MG PO TABS
1.0000 | ORAL_TABLET | Freq: Every day | ORAL | 0 refills | Status: DC
Start: 2023-05-15 — End: 2023-06-15

## 2023-05-15 MED ORDER — MOUNJARO 7.5 MG/0.5ML ~~LOC~~ SOAJ
7.5000 mg | SUBCUTANEOUS | 1 refills | Status: DC
Start: 2023-05-15 — End: 2023-07-10

## 2023-05-15 NOTE — Patient Instructions (Addendum)
I think having you off of the [place] is wise since you were having lows and already on a low-dose. I'd like to continue the [facility] for weight loss benefit.  I am decreasing your dose of amlodipine-olmesartan. It will still be just one pill per day, but the pill itself will be lower-dose.  I want you to keep a log of you BP and sugars and send to me on MyChart in about 2 weeks. Thanks for getting your flu shot.   Follow-up in 2 months unless I say otherwise on mychart.  Eliezer Mccoy, MD

## 2023-05-15 NOTE — Assessment & Plan Note (Addendum)
Multifactorial. Has positive orthostatics in clinic today. Also with documented hypoglycemia at home, will need to address both of these today.  - Have already discontinued Jardiance. Glucose reassuringly WNL today (97). Will cut back on his Mounjaro from 15mg  weekly to 7.5mg  weekly. I do think leaving him on this agent is in his best interest given the necessity of weight loss in preserving his overall health. - Will decrease his BP regimen from amlodipine-olmesartan 5-40mg  daily to 5-20mg  daily.

## 2023-05-15 NOTE — Progress Notes (Unsigned)
    SUBJECTIVE:   CHIEF COMPLAINT / HPI:   Dizziness Had a video visit a few weeks ago for a dizzy episode that happened when he was getting up out of the tub. He had the wherewithall to check both his sugar and his BP at the time. BP was WNL but CBG was  60. We stopped his Jardiance and requested that he follow-up today. We had planned to stop his Mounjaro, but he's continued taking this. Unfortunately having ongoing dizzy episodes. Had another when getting out of the tub this morning and then again when checking in at the desk in clinic.  Also tells me that he gets dizzy when he gets up to urinate around 3am and sometiems checks his sugar and finds it in the 50s.   Health maintenance Flu shot today. Declines COVID.  PERTINENT  PMH / PSH: Orthostatic HTN, PVCs with  >10% burden on metoprolol  OBJECTIVE:   BP 122/76   Pulse 96   Ht 5\' 9"  (1.753 m)   Wt (!) 406 lb 9.6 oz (184.4 kg)   SpO2 99%   BMI 60.04 kg/m   Gen: In very good spirits, at baseline  HEENT: normocephalic, atraumatic, EOM grossly intact, oral mucosa moist, neck supple Respiratory: normal respiratory effort QQ:[id number] Skin: no rashes, no jaundice Psych: appropriate mood and affect  ASSESSMENT/PLAN:   Dizziness Multifactorial. Has positive orthostatics in clinic today. Also with documented hypoglycemia at home, will need to address both of these today.  - Have already discontinued Jardiance. Glucose reassuringly WNL today (97). Will cut back on his Mounjaro from 15mg  weekly to 7.5mg  weekly. I do think leaving him on this agent is in his best interest given the necessity of weight loss in preserving his overall health. - Will decrease his BP regimen from amlodipine-olmesartan 5-40mg  daily to 5-20mg  daily.      Eliezer Mccoy, MD [facility]

## 2023-05-16 DIAGNOSIS — M25561 Pain in right knee: Secondary | ICD-10-CM | POA: Diagnosis not present

## 2023-05-17 DIAGNOSIS — M25561 Pain in right knee: Secondary | ICD-10-CM | POA: Diagnosis not present

## 2023-05-18 ENCOUNTER — Other Ambulatory Visit: Payer: Medicaid Other

## 2023-05-18 DIAGNOSIS — I10 Essential (primary) hypertension: Secondary | ICD-10-CM | POA: Diagnosis not present

## 2023-05-19 LAB — BASIC METABOLIC PANEL
BUN/Creatinine Ratio: 10 (ref 10–24)
BUN: 10 mg/dL (ref 8–27)
CO2: 28 mmol/L (ref 20–29)
Calcium: 8.8 mg/dL (ref 8.6–10.2)
Chloride: 102 mmol/L (ref 96–106)
Creatinine, Ser: 1.01 mg/dL (ref 0.76–1.27)
Glucose: 98 mg/dL (ref 70–99)
Potassium: 4.3 mmol/L (ref 3.5–5.2)
Sodium: 142 mmol/L (ref 134–144)
eGFR: 85 mL/min/{1.73_m2} (ref >59)

## 2023-05-19 LAB — MICROALBUMIN / CREATININE URINE RATIO
Creatinine, Urine: 264.2 mg/dL
Microalb/Creat Ratio: 4 mg/g{creat} (ref 0–29)
Microalbumin, Urine: 11.2 ug/mL

## 2023-05-21 DIAGNOSIS — M25561 Pain in right knee: Secondary | ICD-10-CM | POA: Diagnosis not present

## 2023-05-22 DIAGNOSIS — M25561 Pain in right knee: Secondary | ICD-10-CM | POA: Diagnosis not present

## 2023-05-23 DIAGNOSIS — M25561 Pain in right knee: Secondary | ICD-10-CM | POA: Diagnosis not present

## 2023-05-24 DIAGNOSIS — M25561 Pain in right knee: Secondary | ICD-10-CM | POA: Diagnosis not present

## 2023-05-28 ENCOUNTER — Other Ambulatory Visit: Payer: Self-pay | Admitting: Student

## 2023-05-28 DIAGNOSIS — E1140 Type 2 diabetes mellitus with diabetic neuropathy, unspecified: Secondary | ICD-10-CM

## 2023-05-28 DIAGNOSIS — M25561 Pain in right knee: Secondary | ICD-10-CM | POA: Diagnosis not present

## 2023-05-29 DIAGNOSIS — M25561 Pain in right knee: Secondary | ICD-10-CM | POA: Diagnosis not present

## 2023-05-30 DIAGNOSIS — M25561 Pain in right knee: Secondary | ICD-10-CM | POA: Diagnosis not present

## 2023-05-31 DIAGNOSIS — M25561 Pain in right knee: Secondary | ICD-10-CM | POA: Diagnosis not present

## 2023-06-04 DIAGNOSIS — M25561 Pain in right knee: Secondary | ICD-10-CM | POA: Diagnosis not present

## 2023-06-05 DIAGNOSIS — M25561 Pain in right knee: Secondary | ICD-10-CM | POA: Diagnosis not present

## 2023-06-06 DIAGNOSIS — M25561 Pain in right knee: Secondary | ICD-10-CM | POA: Diagnosis not present

## 2023-06-07 DIAGNOSIS — M25561 Pain in right knee: Secondary | ICD-10-CM | POA: Diagnosis not present

## 2023-06-11 DIAGNOSIS — M25561 Pain in right knee: Secondary | ICD-10-CM | POA: Diagnosis not present

## 2023-06-12 DIAGNOSIS — M25561 Pain in right knee: Secondary | ICD-10-CM | POA: Diagnosis not present

## 2023-06-13 DIAGNOSIS — M25561 Pain in right knee: Secondary | ICD-10-CM | POA: Diagnosis not present

## 2023-06-14 ENCOUNTER — Other Ambulatory Visit: Payer: Self-pay | Admitting: Student

## 2023-06-14 DIAGNOSIS — I951 Orthostatic hypotension: Secondary | ICD-10-CM

## 2023-06-14 DIAGNOSIS — M25561 Pain in right knee: Secondary | ICD-10-CM | POA: Diagnosis not present

## 2023-06-14 DIAGNOSIS — I10 Essential (primary) hypertension: Secondary | ICD-10-CM

## 2023-06-18 ENCOUNTER — Other Ambulatory Visit: Payer: Self-pay | Admitting: Student

## 2023-06-18 DIAGNOSIS — M25561 Pain in right knee: Secondary | ICD-10-CM | POA: Diagnosis not present

## 2023-06-18 DIAGNOSIS — E785 Hyperlipidemia, unspecified: Secondary | ICD-10-CM

## 2023-06-19 DIAGNOSIS — M25561 Pain in right knee: Secondary | ICD-10-CM | POA: Diagnosis not present

## 2023-06-20 DIAGNOSIS — M25561 Pain in right knee: Secondary | ICD-10-CM | POA: Diagnosis not present

## 2023-06-21 DIAGNOSIS — M25561 Pain in right knee: Secondary | ICD-10-CM | POA: Diagnosis not present

## 2023-06-25 DIAGNOSIS — M25561 Pain in right knee: Secondary | ICD-10-CM | POA: Diagnosis not present

## 2023-06-26 DIAGNOSIS — M25561 Pain in right knee: Secondary | ICD-10-CM | POA: Diagnosis not present

## 2023-06-27 DIAGNOSIS — M25561 Pain in right knee: Secondary | ICD-10-CM | POA: Diagnosis not present

## 2023-06-28 DIAGNOSIS — M25561 Pain in right knee: Secondary | ICD-10-CM | POA: Diagnosis not present

## 2023-07-02 DIAGNOSIS — M25561 Pain in right knee: Secondary | ICD-10-CM | POA: Diagnosis not present

## 2023-07-03 DIAGNOSIS — M25561 Pain in right knee: Secondary | ICD-10-CM | POA: Diagnosis not present

## 2023-07-04 DIAGNOSIS — M25561 Pain in right knee: Secondary | ICD-10-CM | POA: Diagnosis not present

## 2023-07-05 DIAGNOSIS — M25561 Pain in right knee: Secondary | ICD-10-CM | POA: Diagnosis not present

## 2023-07-09 ENCOUNTER — Other Ambulatory Visit: Payer: Self-pay | Admitting: Cardiology

## 2023-07-09 ENCOUNTER — Other Ambulatory Visit: Payer: Self-pay | Admitting: Student

## 2023-07-09 DIAGNOSIS — E1140 Type 2 diabetes mellitus with diabetic neuropathy, unspecified: Secondary | ICD-10-CM

## 2023-07-09 DIAGNOSIS — M25561 Pain in right knee: Secondary | ICD-10-CM | POA: Diagnosis not present

## 2023-07-10 DIAGNOSIS — M25561 Pain in right knee: Secondary | ICD-10-CM | POA: Diagnosis not present

## 2023-07-11 DIAGNOSIS — M25561 Pain in right knee: Secondary | ICD-10-CM | POA: Diagnosis not present

## 2023-07-12 DIAGNOSIS — M25561 Pain in right knee: Secondary | ICD-10-CM | POA: Diagnosis not present

## 2023-07-16 DIAGNOSIS — M25561 Pain in right knee: Secondary | ICD-10-CM | POA: Diagnosis not present

## 2023-07-17 DIAGNOSIS — M25561 Pain in right knee: Secondary | ICD-10-CM | POA: Diagnosis not present

## 2023-07-18 DIAGNOSIS — M25561 Pain in right knee: Secondary | ICD-10-CM | POA: Diagnosis not present

## 2023-07-19 DIAGNOSIS — M25561 Pain in right knee: Secondary | ICD-10-CM | POA: Diagnosis not present

## 2023-07-23 DIAGNOSIS — M25561 Pain in right knee: Secondary | ICD-10-CM | POA: Diagnosis not present

## 2023-07-24 DIAGNOSIS — M25561 Pain in right knee: Secondary | ICD-10-CM | POA: Diagnosis not present

## 2023-07-25 DIAGNOSIS — M25561 Pain in right knee: Secondary | ICD-10-CM | POA: Diagnosis not present

## 2023-07-26 DIAGNOSIS — M25561 Pain in right knee: Secondary | ICD-10-CM | POA: Diagnosis not present

## 2023-07-30 DIAGNOSIS — M25561 Pain in right knee: Secondary | ICD-10-CM | POA: Diagnosis not present

## 2023-07-31 DIAGNOSIS — M25561 Pain in right knee: Secondary | ICD-10-CM | POA: Diagnosis not present

## 2023-08-01 DIAGNOSIS — M25561 Pain in right knee: Secondary | ICD-10-CM | POA: Diagnosis not present

## 2023-08-02 DIAGNOSIS — M25561 Pain in right knee: Secondary | ICD-10-CM | POA: Diagnosis not present

## 2023-08-06 DIAGNOSIS — M25561 Pain in right knee: Secondary | ICD-10-CM | POA: Diagnosis not present

## 2023-08-07 DIAGNOSIS — M25561 Pain in right knee: Secondary | ICD-10-CM | POA: Diagnosis not present

## 2023-08-08 DIAGNOSIS — M25561 Pain in right knee: Secondary | ICD-10-CM | POA: Diagnosis not present

## 2023-08-09 DIAGNOSIS — M25561 Pain in right knee: Secondary | ICD-10-CM | POA: Diagnosis not present

## 2023-08-13 DIAGNOSIS — M25561 Pain in right knee: Secondary | ICD-10-CM | POA: Diagnosis not present

## 2023-08-14 DIAGNOSIS — M25561 Pain in right knee: Secondary | ICD-10-CM | POA: Diagnosis not present

## 2023-08-15 DIAGNOSIS — M25561 Pain in right knee: Secondary | ICD-10-CM | POA: Diagnosis not present

## 2023-08-16 ENCOUNTER — Other Ambulatory Visit: Payer: Self-pay | Admitting: Student

## 2023-08-16 DIAGNOSIS — M25561 Pain in right knee: Secondary | ICD-10-CM | POA: Diagnosis not present

## 2023-08-20 DIAGNOSIS — M25561 Pain in right knee: Secondary | ICD-10-CM | POA: Diagnosis not present

## 2023-08-21 DIAGNOSIS — M25561 Pain in right knee: Secondary | ICD-10-CM | POA: Diagnosis not present

## 2023-08-22 DIAGNOSIS — M25561 Pain in right knee: Secondary | ICD-10-CM | POA: Diagnosis not present

## 2023-08-23 DIAGNOSIS — M25561 Pain in right knee: Secondary | ICD-10-CM | POA: Diagnosis not present

## 2023-08-27 DIAGNOSIS — M25561 Pain in right knee: Secondary | ICD-10-CM | POA: Diagnosis not present

## 2023-08-28 DIAGNOSIS — M25561 Pain in right knee: Secondary | ICD-10-CM | POA: Diagnosis not present

## 2023-08-29 DIAGNOSIS — M25561 Pain in right knee: Secondary | ICD-10-CM | POA: Diagnosis not present

## 2023-08-30 ENCOUNTER — Ambulatory Visit: Payer: Medicaid Other | Admitting: Student

## 2023-08-30 DIAGNOSIS — M25561 Pain in right knee: Secondary | ICD-10-CM | POA: Diagnosis not present

## 2023-09-03 DIAGNOSIS — M25561 Pain in right knee: Secondary | ICD-10-CM | POA: Diagnosis not present

## 2023-09-04 DIAGNOSIS — M25561 Pain in right knee: Secondary | ICD-10-CM | POA: Diagnosis not present

## 2023-09-05 DIAGNOSIS — M25561 Pain in right knee: Secondary | ICD-10-CM | POA: Diagnosis not present

## 2023-09-06 DIAGNOSIS — M25561 Pain in right knee: Secondary | ICD-10-CM | POA: Diagnosis not present

## 2023-09-10 DIAGNOSIS — M25561 Pain in right knee: Secondary | ICD-10-CM | POA: Diagnosis not present

## 2023-09-11 DIAGNOSIS — M25561 Pain in right knee: Secondary | ICD-10-CM | POA: Diagnosis not present

## 2023-09-12 DIAGNOSIS — M25561 Pain in right knee: Secondary | ICD-10-CM | POA: Diagnosis not present

## 2023-09-13 DIAGNOSIS — M25561 Pain in right knee: Secondary | ICD-10-CM | POA: Diagnosis not present

## 2023-09-14 ENCOUNTER — Ambulatory Visit: Payer: Medicaid Other | Admitting: Student

## 2023-09-17 DIAGNOSIS — M25561 Pain in right knee: Secondary | ICD-10-CM | POA: Diagnosis not present

## 2023-09-18 DIAGNOSIS — M25561 Pain in right knee: Secondary | ICD-10-CM | POA: Diagnosis not present

## 2023-09-19 DIAGNOSIS — M25561 Pain in right knee: Secondary | ICD-10-CM | POA: Diagnosis not present

## 2023-09-20 DIAGNOSIS — M25561 Pain in right knee: Secondary | ICD-10-CM | POA: Diagnosis not present

## 2023-09-23 ENCOUNTER — Other Ambulatory Visit: Payer: Self-pay | Admitting: Student

## 2023-09-23 DIAGNOSIS — I10 Essential (primary) hypertension: Secondary | ICD-10-CM

## 2023-09-23 DIAGNOSIS — I951 Orthostatic hypotension: Secondary | ICD-10-CM

## 2023-09-25 DIAGNOSIS — M25561 Pain in right knee: Secondary | ICD-10-CM | POA: Diagnosis not present

## 2023-09-26 DIAGNOSIS — M25561 Pain in right knee: Secondary | ICD-10-CM | POA: Diagnosis not present

## 2023-09-27 DIAGNOSIS — M25561 Pain in right knee: Secondary | ICD-10-CM | POA: Diagnosis not present

## 2023-10-01 DIAGNOSIS — M25561 Pain in right knee: Secondary | ICD-10-CM | POA: Diagnosis not present

## 2023-10-02 DIAGNOSIS — M25561 Pain in right knee: Secondary | ICD-10-CM | POA: Diagnosis not present

## 2023-10-04 DIAGNOSIS — M25561 Pain in right knee: Secondary | ICD-10-CM | POA: Diagnosis not present

## 2023-10-08 DIAGNOSIS — M25561 Pain in right knee: Secondary | ICD-10-CM | POA: Diagnosis not present

## 2023-10-09 DIAGNOSIS — M25561 Pain in right knee: Secondary | ICD-10-CM | POA: Diagnosis not present

## 2023-10-10 DIAGNOSIS — M25561 Pain in right knee: Secondary | ICD-10-CM | POA: Diagnosis not present

## 2023-10-11 ENCOUNTER — Other Ambulatory Visit: Payer: Self-pay | Admitting: Student

## 2023-10-11 DIAGNOSIS — M25561 Pain in right knee: Secondary | ICD-10-CM | POA: Diagnosis not present

## 2023-10-11 DIAGNOSIS — E1140 Type 2 diabetes mellitus with diabetic neuropathy, unspecified: Secondary | ICD-10-CM

## 2023-10-12 ENCOUNTER — Other Ambulatory Visit: Payer: Self-pay | Admitting: Student

## 2023-10-12 DIAGNOSIS — E1140 Type 2 diabetes mellitus with diabetic neuropathy, unspecified: Secondary | ICD-10-CM

## 2023-10-12 MED ORDER — TIRZEPATIDE 15 MG/0.5ML ~~LOC~~ SOAJ: 15.0000 mg | SUBCUTANEOUS | 1 refills | Status: DC

## 2023-10-15 DIAGNOSIS — M25561 Pain in right knee: Secondary | ICD-10-CM | POA: Diagnosis not present

## 2023-10-16 DIAGNOSIS — M25561 Pain in right knee: Secondary | ICD-10-CM | POA: Diagnosis not present

## 2023-10-17 DIAGNOSIS — M25561 Pain in right knee: Secondary | ICD-10-CM | POA: Diagnosis not present

## 2023-10-18 DIAGNOSIS — M25561 Pain in right knee: Secondary | ICD-10-CM | POA: Diagnosis not present

## 2023-10-22 DIAGNOSIS — M25561 Pain in right knee: Secondary | ICD-10-CM | POA: Diagnosis not present

## 2023-10-23 DIAGNOSIS — M25561 Pain in right knee: Secondary | ICD-10-CM | POA: Diagnosis not present

## 2023-10-24 DIAGNOSIS — M25561 Pain in right knee: Secondary | ICD-10-CM | POA: Diagnosis not present

## 2023-10-25 DIAGNOSIS — M25561 Pain in right knee: Secondary | ICD-10-CM | POA: Diagnosis not present

## 2023-10-29 DIAGNOSIS — M25561 Pain in right knee: Secondary | ICD-10-CM | POA: Diagnosis not present

## 2023-10-30 DIAGNOSIS — M25561 Pain in right knee: Secondary | ICD-10-CM | POA: Diagnosis not present

## 2023-10-31 DIAGNOSIS — M25561 Pain in right knee: Secondary | ICD-10-CM | POA: Diagnosis not present

## 2023-11-01 DIAGNOSIS — M25561 Pain in right knee: Secondary | ICD-10-CM | POA: Diagnosis not present

## 2023-11-05 DIAGNOSIS — M25561 Pain in right knee: Secondary | ICD-10-CM | POA: Diagnosis not present

## 2023-11-06 ENCOUNTER — Encounter: Payer: Self-pay | Admitting: Student

## 2023-11-06 DIAGNOSIS — M25561 Pain in right knee: Secondary | ICD-10-CM | POA: Diagnosis not present

## 2023-11-07 DIAGNOSIS — M25561 Pain in right knee: Secondary | ICD-10-CM | POA: Diagnosis not present

## 2023-11-08 DIAGNOSIS — M25561 Pain in right knee: Secondary | ICD-10-CM | POA: Diagnosis not present

## 2023-11-09 ENCOUNTER — Telehealth: Payer: Self-pay | Admitting: Student

## 2023-11-09 NOTE — Telephone Encounter (Signed)
 Called and spoke with [employer]. I have placed appeal for patient's wheelchair being repaired which was denied. Appeal number: [id number], case should be resolved 30 days.  Called patient and confirmed DOB. Denies any repairs on this. Discussed appeal has been submitted

## 2023-11-12 DIAGNOSIS — M25561 Pain in right knee: Secondary | ICD-10-CM | POA: Diagnosis not present

## 2023-11-13 DIAGNOSIS — M25561 Pain in right knee: Secondary | ICD-10-CM | POA: Diagnosis not present

## 2023-11-14 DIAGNOSIS — M25561 Pain in right knee: Secondary | ICD-10-CM | POA: Diagnosis not present

## 2023-11-15 DIAGNOSIS — M25561 Pain in right knee: Secondary | ICD-10-CM | POA: Diagnosis not present

## 2023-11-19 DIAGNOSIS — M25561 Pain in right knee: Secondary | ICD-10-CM | POA: Diagnosis not present

## 2023-11-20 DIAGNOSIS — M25561 Pain in right knee: Secondary | ICD-10-CM | POA: Diagnosis not present

## 2023-11-21 DIAGNOSIS — M25561 Pain in right knee: Secondary | ICD-10-CM | POA: Diagnosis not present

## 2023-11-22 DIAGNOSIS — M25561 Pain in right knee: Secondary | ICD-10-CM | POA: Diagnosis not present

## 2023-11-26 DIAGNOSIS — M25561 Pain in right knee: Secondary | ICD-10-CM | POA: Diagnosis not present

## 2023-11-27 DIAGNOSIS — M25561 Pain in right knee: Secondary | ICD-10-CM | POA: Diagnosis not present

## 2023-11-28 DIAGNOSIS — M25561 Pain in right knee: Secondary | ICD-10-CM | POA: Diagnosis not present

## 2023-11-29 DIAGNOSIS — M25561 Pain in right knee: Secondary | ICD-10-CM | POA: Diagnosis not present

## 2023-12-03 ENCOUNTER — Other Ambulatory Visit: Payer: Self-pay | Admitting: Student

## 2023-12-03 DIAGNOSIS — E119 Type 2 diabetes mellitus without complications: Secondary | ICD-10-CM

## 2023-12-03 DIAGNOSIS — E1140 Type 2 diabetes mellitus with diabetic neuropathy, unspecified: Secondary | ICD-10-CM

## 2023-12-03 DIAGNOSIS — M25561 Pain in right knee: Secondary | ICD-10-CM | POA: Diagnosis not present

## 2023-12-04 DIAGNOSIS — M25561 Pain in right knee: Secondary | ICD-10-CM | POA: Diagnosis not present

## 2023-12-05 DIAGNOSIS — M25561 Pain in right knee: Secondary | ICD-10-CM | POA: Diagnosis not present

## 2023-12-06 ENCOUNTER — Other Ambulatory Visit: Payer: Self-pay | Admitting: Student

## 2023-12-06 DIAGNOSIS — E119 Type 2 diabetes mellitus without complications: Secondary | ICD-10-CM

## 2023-12-06 DIAGNOSIS — M25561 Pain in right knee: Secondary | ICD-10-CM | POA: Diagnosis not present

## 2023-12-10 DIAGNOSIS — M25561 Pain in right knee: Secondary | ICD-10-CM | POA: Diagnosis not present

## 2023-12-11 DIAGNOSIS — M25561 Pain in right knee: Secondary | ICD-10-CM | POA: Diagnosis not present

## 2023-12-12 DIAGNOSIS — M25561 Pain in right knee: Secondary | ICD-10-CM | POA: Diagnosis not present

## 2023-12-13 DIAGNOSIS — M25561 Pain in right knee: Secondary | ICD-10-CM | POA: Diagnosis not present

## 2023-12-17 ENCOUNTER — Ambulatory Visit: Admitting: Student

## 2023-12-17 ENCOUNTER — Encounter: Payer: Self-pay | Admitting: Student

## 2023-12-17 VITALS — BP 124/88 | HR 66 | Ht 67.0 in | Wt 394.4 lb

## 2023-12-17 DIAGNOSIS — G8929 Other chronic pain: Secondary | ICD-10-CM | POA: Diagnosis not present

## 2023-12-17 DIAGNOSIS — E1140 Type 2 diabetes mellitus with diabetic neuropathy, unspecified: Secondary | ICD-10-CM | POA: Diagnosis not present

## 2023-12-17 DIAGNOSIS — M25562 Pain in left knee: Secondary | ICD-10-CM | POA: Diagnosis not present

## 2023-12-17 DIAGNOSIS — Z7409 Other reduced mobility: Secondary | ICD-10-CM

## 2023-12-17 DIAGNOSIS — Z23 Encounter for immunization: Secondary | ICD-10-CM | POA: Diagnosis not present

## 2023-12-17 DIAGNOSIS — M25561 Pain in right knee: Secondary | ICD-10-CM | POA: Diagnosis not present

## 2023-12-17 LAB — POCT GLYCOSYLATED HEMOGLOBIN (HGB A1C): HbA1c, POC (controlled diabetic range): 5.7 % (ref 0.0–7.0)

## 2023-12-17 MED ORDER — TIRZEPATIDE 15 MG/0.5ML ~~LOC~~ SOAJ: 15.0000 mg | SUBCUTANEOUS | 1 refills | Status: DC

## 2023-12-17 MED ORDER — MELOXICAM 7.5 MG PO TABS: 7.5000 mg | ORAL_TABLET | Freq: Every day | ORAL | 0 refills | Status: DC

## 2023-12-17 NOTE — Assessment & Plan Note (Signed)
 Chronic knee pain managed with topical lidocaine  patches. X-rays planned. Discussed meloxicam  and advised against concurrent ibuprofen  use. Future cortisone injections discussed. - Prescribe meloxicam  (Mobic ) once daily. - Order knee x-rays at [facility]. - Discuss potential for cortisone injections at future visit if desired. He will "read up on it."

## 2023-12-17 NOTE — Assessment & Plan Note (Signed)
 Type 2 diabetes mellitus well-controlled with A1c of 5.7. On Mounjaro  15 mg with beneficial weight loss. No evidence of retinopathy on last eye exam by patient's report. No record available for review.  - Continue Mounjaro  at 15 mg. - Encourage continued weight management.

## 2023-12-17 NOTE — Assessment & Plan Note (Signed)
 Needs assistance with appeal for wheelchair repairs.  - I will call his insurance and see what, if any, documentation is required from our office.  Durable medical equipment needs Requires bariatric shower chair and elevated toilet seat for daily activities. - Order bariatric shower chair and elevated toilet seat through DME agency.

## 2023-12-17 NOTE — Patient Instructions (Signed)
 Mr. Craig, [name] to see you!  Your BP and diabetes seem to be under great control. Let's keep your Mounjaro  at 15mg . Congrats on the weight loss. I've ordered your shower chair and elevated toilet seat.  I am ordering a medication called meloxicam  (or mobic ) for your knees. This is like a prescription strength form of ibuprofen  and is just dosed 1 daily. Please go to [facility] at [employer] to get your X-Ray done. They are open 7:30a-5p Monday-Friday. You do not need an appointment to get this done.   Go see your eye Dr. And have them fax us  your report.   Come back to see me in 3 months.  Alexa Andrews, MD

## 2023-12-17 NOTE — Progress Notes (Unsigned)
 SUBJECTIVE:   CHIEF COMPLAINT / HPI:   Craig [name] is a 63 year old male with diabetes who presents for follow-up on his medical conditions and equipment needs.  He has ongoing issues with his mobility chair, which has been problematic since acquisition. The original company that provided the chair has gone out of business, and he is now dealing with another company for parts. He has a five-year warranty on the chair and is currently involved in an appeal process to cover repair costs. He needs assistance with signing documents related to this process.  He is currently on Mounjaro  15 mg for diabetes management and reports doing well with significant weight loss. His recent A1c is 5.7, indicating good control of his diabetes. No signs of diabetic retinopathy as per his last eye examination, although he has had to reschedule appointments due to cancellations and weather conditions.  He requires assistance with bathing and has requested prescriptions for a bariatric shower chair and an elevated toilet seat.  He experiences knee pain and has been using topical patches for relief. He inquires about alternative pain management options. He has not had knee injections before and expresses some apprehension about them. He is also scheduled for knee x-rays to further evaluate his condition.  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 He recently had a successful birthday show, which was financially rewarding.   OBJECTIVE:   BP 124/88   Pulse 66   Ht 5\' 7"  (1.702 m)   Wt (!) 394 lb 6.4 oz (178.9 kg)   SpO2 98%   BMI 61.77 kg/m   Physical Exam Constitutional:      Comments: In good spirits, NAD, comes in using his motorized wheelchair   Cardiovascular:     Rate and Rhythm: Normal rate and regular rhythm.  Pulmonary:     Effort: No respiratory distress.  Musculoskeletal:      Comments: Wearing neoprene knee sleeve on Left. No obvious deformity on either side. Tenderness to palpation of joint line bilaterally.   Skin:    General: Skin is warm and dry.  Neurological:     General: No focal deficit present.      ASSESSMENT/PLAN:   Assessment & Plan Type 2 diabetes mellitus with diabetic neuropathy, without long-term current use of insulin  (HCC) Type 2 diabetes mellitus well-controlled with A1c of 5.7. On Mounjaro  15 mg with beneficial weight loss. No evidence of retinopathy on last eye exam by patient's report. No record available for review.  - Continue Mounjaro  at 15 mg. - Continue Jardiance  10mg  daily - Encourage continued weight management.  Limited mobility Needs assistance with appeal for wheelchair repairs.  - I will call his insurance and see what, if any, documentation is required from our office.  Durable medical equipment needs Requires bariatric shower chair and elevated toilet seat for daily activities. - Order bariatric shower chair and elevated toilet seat through DME agency. Chronic pain of both knees Chronic knee pain managed with topical lidocaine  patches. X-rays planned. Discussed meloxicam  and advised against concurrent ibuprofen  use. Future cortisone injections discussed. - Prescribe meloxicam  (Mobic ) once daily. - Order knee x-rays at [facility]. - Discuss potential for cortisone injections at future visit if desired. He will "read up on it." Encounter for immunization Tdap due today. - administered in clinic      J Lark Plum, MD [facility]

## 2023-12-18 DIAGNOSIS — M25561 Pain in right knee: Secondary | ICD-10-CM | POA: Diagnosis not present

## 2023-12-19 DIAGNOSIS — M25561 Pain in right knee: Secondary | ICD-10-CM | POA: Diagnosis not present

## 2023-12-20 DIAGNOSIS — M25561 Pain in right knee: Secondary | ICD-10-CM | POA: Diagnosis not present

## 2023-12-24 DIAGNOSIS — M25561 Pain in right knee: Secondary | ICD-10-CM | POA: Diagnosis not present

## 2023-12-25 DIAGNOSIS — M25561 Pain in right knee: Secondary | ICD-10-CM | POA: Diagnosis not present

## 2023-12-26 DIAGNOSIS — M25561 Pain in right knee: Secondary | ICD-10-CM | POA: Diagnosis not present

## 2023-12-27 DIAGNOSIS — M25561 Pain in right knee: Secondary | ICD-10-CM | POA: Diagnosis not present

## 2023-12-31 DIAGNOSIS — M25561 Pain in right knee: Secondary | ICD-10-CM | POA: Diagnosis not present

## 2024-01-01 DIAGNOSIS — M25561 Pain in right knee: Secondary | ICD-10-CM | POA: Diagnosis not present

## 2024-01-02 DIAGNOSIS — M25561 Pain in right knee: Secondary | ICD-10-CM | POA: Diagnosis not present

## 2024-01-03 DIAGNOSIS — M25561 Pain in right knee: Secondary | ICD-10-CM | POA: Diagnosis not present

## 2024-01-03 DIAGNOSIS — N3946 Mixed incontinence: Secondary | ICD-10-CM | POA: Diagnosis not present

## 2024-01-03 DIAGNOSIS — E119 Type 2 diabetes mellitus without complications: Secondary | ICD-10-CM | POA: Diagnosis not present

## 2024-01-08 DIAGNOSIS — M25561 Pain in right knee: Secondary | ICD-10-CM | POA: Diagnosis not present

## 2024-01-09 DIAGNOSIS — M25561 Pain in right knee: Secondary | ICD-10-CM | POA: Diagnosis not present

## 2024-01-10 DIAGNOSIS — M25561 Pain in right knee: Secondary | ICD-10-CM | POA: Diagnosis not present

## 2024-01-14 DIAGNOSIS — M25561 Pain in right knee: Secondary | ICD-10-CM | POA: Diagnosis not present

## 2024-01-15 DIAGNOSIS — M25561 Pain in right knee: Secondary | ICD-10-CM | POA: Diagnosis not present

## 2024-01-16 DIAGNOSIS — M25561 Pain in right knee: Secondary | ICD-10-CM | POA: Diagnosis not present

## 2024-01-17 DIAGNOSIS — M25561 Pain in right knee: Secondary | ICD-10-CM | POA: Diagnosis not present

## 2024-01-21 DIAGNOSIS — M25561 Pain in right knee: Secondary | ICD-10-CM | POA: Diagnosis not present

## 2024-01-22 ENCOUNTER — Encounter: Payer: Self-pay | Admitting: *Deleted

## 2024-01-22 DIAGNOSIS — M25561 Pain in right knee: Secondary | ICD-10-CM | POA: Diagnosis not present

## 2024-01-23 DIAGNOSIS — M25561 Pain in right knee: Secondary | ICD-10-CM | POA: Diagnosis not present

## 2024-01-24 DIAGNOSIS — M25561 Pain in right knee: Secondary | ICD-10-CM | POA: Diagnosis not present

## 2024-01-28 DIAGNOSIS — M25561 Pain in right knee: Secondary | ICD-10-CM | POA: Diagnosis not present

## 2024-01-29 DIAGNOSIS — M25561 Pain in right knee: Secondary | ICD-10-CM | POA: Diagnosis not present

## 2024-01-30 DIAGNOSIS — M25561 Pain in right knee: Secondary | ICD-10-CM | POA: Diagnosis not present

## 2024-01-31 DIAGNOSIS — M25561 Pain in right knee: Secondary | ICD-10-CM | POA: Diagnosis not present

## 2024-02-04 DIAGNOSIS — M25561 Pain in right knee: Secondary | ICD-10-CM | POA: Diagnosis not present

## 2024-02-05 DIAGNOSIS — M25561 Pain in right knee: Secondary | ICD-10-CM | POA: Diagnosis not present

## 2024-02-06 DIAGNOSIS — M25561 Pain in right knee: Secondary | ICD-10-CM | POA: Diagnosis not present

## 2024-02-07 DIAGNOSIS — M25561 Pain in right knee: Secondary | ICD-10-CM | POA: Diagnosis not present

## 2024-02-11 ENCOUNTER — Encounter: Payer: Self-pay | Admitting: Student

## 2024-02-11 DIAGNOSIS — M25561 Pain in right knee: Secondary | ICD-10-CM | POA: Diagnosis not present

## 2024-02-12 DIAGNOSIS — M25561 Pain in right knee: Secondary | ICD-10-CM | POA: Diagnosis not present

## 2024-02-13 DIAGNOSIS — M25561 Pain in right knee: Secondary | ICD-10-CM | POA: Diagnosis not present

## 2024-02-14 DIAGNOSIS — M25561 Pain in right knee: Secondary | ICD-10-CM | POA: Diagnosis not present

## 2024-02-18 DIAGNOSIS — M25561 Pain in right knee: Secondary | ICD-10-CM | POA: Diagnosis not present

## 2024-02-19 DIAGNOSIS — M25561 Pain in right knee: Secondary | ICD-10-CM | POA: Diagnosis not present

## 2024-02-20 DIAGNOSIS — M25561 Pain in right knee: Secondary | ICD-10-CM | POA: Diagnosis not present

## 2024-02-21 DIAGNOSIS — M25561 Pain in right knee: Secondary | ICD-10-CM | POA: Diagnosis not present

## 2024-02-25 DIAGNOSIS — M25561 Pain in right knee: Secondary | ICD-10-CM | POA: Diagnosis not present

## 2024-02-26 DIAGNOSIS — M25561 Pain in right knee: Secondary | ICD-10-CM | POA: Diagnosis not present

## 2024-02-27 DIAGNOSIS — E119 Type 2 diabetes mellitus without complications: Secondary | ICD-10-CM | POA: Diagnosis not present

## 2024-02-27 DIAGNOSIS — N3946 Mixed incontinence: Secondary | ICD-10-CM | POA: Diagnosis not present

## 2024-02-27 DIAGNOSIS — M25561 Pain in right knee: Secondary | ICD-10-CM | POA: Diagnosis not present

## 2024-02-28 DIAGNOSIS — M25561 Pain in right knee: Secondary | ICD-10-CM | POA: Diagnosis not present

## 2024-03-03 DIAGNOSIS — M25561 Pain in right knee: Secondary | ICD-10-CM | POA: Diagnosis not present

## 2024-03-04 ENCOUNTER — Other Ambulatory Visit: Payer: Self-pay | Admitting: Cardiology

## 2024-03-04 DIAGNOSIS — M25561 Pain in right knee: Secondary | ICD-10-CM | POA: Diagnosis not present

## 2024-03-05 DIAGNOSIS — M25561 Pain in right knee: Secondary | ICD-10-CM | POA: Diagnosis not present

## 2024-03-06 DIAGNOSIS — M25561 Pain in right knee: Secondary | ICD-10-CM | POA: Diagnosis not present

## 2024-03-06 DIAGNOSIS — E119 Type 2 diabetes mellitus without complications: Secondary | ICD-10-CM | POA: Diagnosis not present

## 2024-03-06 DIAGNOSIS — N3946 Mixed incontinence: Secondary | ICD-10-CM | POA: Diagnosis not present

## 2024-03-10 DIAGNOSIS — M25561 Pain in right knee: Secondary | ICD-10-CM | POA: Diagnosis not present

## 2024-03-11 DIAGNOSIS — M25561 Pain in right knee: Secondary | ICD-10-CM | POA: Diagnosis not present

## 2024-03-12 DIAGNOSIS — M25561 Pain in right knee: Secondary | ICD-10-CM | POA: Diagnosis not present

## 2024-03-13 DIAGNOSIS — M25561 Pain in right knee: Secondary | ICD-10-CM | POA: Diagnosis not present

## 2024-03-17 DIAGNOSIS — M25561 Pain in right knee: Secondary | ICD-10-CM | POA: Diagnosis not present

## 2024-03-18 ENCOUNTER — Ambulatory Visit

## 2024-03-18 DIAGNOSIS — M25561 Pain in right knee: Secondary | ICD-10-CM | POA: Diagnosis not present

## 2024-03-18 NOTE — Progress Notes (Deleted)
    SUBJECTIVE:   CHIEF COMPLAINT / HPI:   Craig [name] is a 63 y.o. male presenting for follow-up of diabetes.  Current medications: Jardiance  10 mg daily, Mounjaro  15 mg weekly, Lipitor 80 mg daily    Care gaps: - Ophthalmology exam/diabetic eye exam - Shingrix - Colonoscopy - eGFR due 05/2024 - Urine ACR due 05/2024 - A1c due 06/2024  PERTINENT  PMH / PSH: T2DM, HTN, HLD  OBJECTIVE:   There were no vitals taken for this visit.  ***  ASSESSMENT/PLAN:   Assessment & Plan Type 2 diabetes mellitus with diabetic neuropathy, without long-term current use of insulin  (HCC)      Alan Flies, MD [facility]

## 2024-03-19 DIAGNOSIS — M25561 Pain in right knee: Secondary | ICD-10-CM | POA: Diagnosis not present

## 2024-03-20 DIAGNOSIS — M25561 Pain in right knee: Secondary | ICD-10-CM | POA: Diagnosis not present

## 2024-03-24 ENCOUNTER — Ambulatory Visit

## 2024-03-24 DIAGNOSIS — M25561 Pain in right knee: Secondary | ICD-10-CM | POA: Diagnosis not present

## 2024-03-25 DIAGNOSIS — M25561 Pain in right knee: Secondary | ICD-10-CM | POA: Diagnosis not present

## 2024-03-26 DIAGNOSIS — M25561 Pain in right knee: Secondary | ICD-10-CM | POA: Diagnosis not present

## 2024-03-27 DIAGNOSIS — M25561 Pain in right knee: Secondary | ICD-10-CM | POA: Diagnosis not present

## 2024-03-29 DIAGNOSIS — E119 Type 2 diabetes mellitus without complications: Secondary | ICD-10-CM | POA: Diagnosis not present

## 2024-03-29 DIAGNOSIS — N3946 Mixed incontinence: Secondary | ICD-10-CM | POA: Diagnosis not present

## 2024-03-31 DIAGNOSIS — M25561 Pain in right knee: Secondary | ICD-10-CM | POA: Diagnosis not present

## 2024-04-01 DIAGNOSIS — M25561 Pain in right knee: Secondary | ICD-10-CM | POA: Diagnosis not present

## 2024-04-02 DIAGNOSIS — M25561 Pain in right knee: Secondary | ICD-10-CM | POA: Diagnosis not present

## 2024-04-03 DIAGNOSIS — M25561 Pain in right knee: Secondary | ICD-10-CM | POA: Diagnosis not present

## 2024-04-07 ENCOUNTER — Ambulatory Visit (INDEPENDENT_AMBULATORY_CARE_PROVIDER_SITE_OTHER)

## 2024-04-07 VITALS — BP 156/82 | HR 74 | Ht 67.0 in | Wt >= 6400 oz

## 2024-04-07 DIAGNOSIS — E1140 Type 2 diabetes mellitus with diabetic neuropathy, unspecified: Secondary | ICD-10-CM

## 2024-04-07 DIAGNOSIS — M25561 Pain in right knee: Secondary | ICD-10-CM

## 2024-04-07 DIAGNOSIS — F32A Depression, unspecified: Secondary | ICD-10-CM

## 2024-04-07 DIAGNOSIS — I10 Essential (primary) hypertension: Secondary | ICD-10-CM | POA: Diagnosis not present

## 2024-04-07 DIAGNOSIS — M25562 Pain in left knee: Secondary | ICD-10-CM

## 2024-04-07 DIAGNOSIS — R051 Acute cough: Secondary | ICD-10-CM | POA: Diagnosis not present

## 2024-04-07 DIAGNOSIS — G8929 Other chronic pain: Secondary | ICD-10-CM

## 2024-04-07 DIAGNOSIS — F419 Anxiety disorder, unspecified: Secondary | ICD-10-CM

## 2024-04-07 LAB — POCT GLYCOSYLATED HEMOGLOBIN (HGB A1C): HbA1c, POC (controlled diabetic range): 6.1 % (ref 0.0–7.0)

## 2024-04-07 MED ORDER — MELOXICAM 7.5 MG PO TABS: 7.5000 mg | ORAL_TABLET | Freq: Every day | ORAL | 0 refills | Status: DC

## 2024-04-07 MED ORDER — TIRZEPATIDE 10 MG/0.5ML ~~LOC~~ SOAJ: 10.0000 mg | SUBCUTANEOUS | 3 refills | Status: AC

## 2024-04-07 MED ORDER — AMLODIPINE-OLMESARTAN 10-20 MG PO TABS: 1.0000 | ORAL_TABLET | Freq: Every day | ORAL | 3 refills | Status: DC

## 2024-04-07 NOTE — Patient Instructions (Addendum)
 Dear Craig [name],   It was great seeing you in clinic today! You came in for follow-up of your diabetes. Your sugars are doing well on Mounjaro  alone and I don't think we need to restart Jardiance  at this time. Continue to work on limiting sugars and fried/fatty foods in your diet.   You also reported some wheezing and cough. Your lungs sounded good on exam, and I think this was probably a mild cold or allergies.  There are a few things for you to do outside of clinic: - Follow-up with your cardiologist and eye doctor - I am decreasing Mounjaro  to 10 mg weekly to help with bowel issues - I am increasing your amlodipine -olmesartan  (Azor ) to 10-20 mg daily to improve BP - I am getting labs for you today; results will be available in MyChart, and I will reach out if anything is abnormal - I have placed an order for a shower chair to be delivered to you - Follow-up at whatever imaging center you prefer to get your Xrays of your knees  Thank you for allowing me to be a part of your care team! Alan Flies, MD [facility] [address] [place], [place], [place] [address] [phone]

## 2024-04-07 NOTE — Assessment & Plan Note (Addendum)
 A1c today of 6.1. Well controlled even off Jardiance ; don't see need to restart this at this time. - Eye exam scheduled for 08/2024 - Decrease Mounjaro  from 15 mg weekly to 10 mg weekly to help with bowel issues - Labs: BMP for GFR, urine albumin/Cr ratio

## 2024-04-07 NOTE — Assessment & Plan Note (Signed)
 Patient without SI at today's visit, PHQ9 of 17 with negative q9. Not interested in therapy or medications at this time. - Encouraged patient to continue activities that bring him joy - He reports he will get in touch with our office if mood worsens

## 2024-04-07 NOTE — Assessment & Plan Note (Addendum)
 Knee Xrays ordered 12/2023, patient stil to get; can still use same order Meloxicam  for pain and lidocaine  patches; refilling meloxicam  - Put order through DME agency for bariatric shower chair

## 2024-04-07 NOTE — Progress Notes (Signed)
    SUBJECTIVE:   CHIEF COMPLAINT / HPI:   Craig [name] is a 63 y.o. male presenting for diabetes follow-up. Also reports wheezing, stress, and knee pain.  Diabetes mellitus Has been off Jardiance  for last couple months. Does report recent issues with bowel incontinence, which he relates to his Mounjaro . Trying to eat less fried/fatty foods, limit sugar. Is more stressed and has possibly been eating more with this. Using [employer] powder in water to help with energy and reduce appetite. - Needs diabetic eye exam - BMP to assess eGFR (due 10?2025) - Urine albumin creatinine ratio (due 05/2024)  Wheezing Patient reports he has experienced wheezing and cough for the past week. Using Vicks Vaporub. Not feeling congested. No chest pain, burning. No shortness of breath. No itching eyes, no sneezing.  Mental Stress Has been very stressed out by the state of the world. Reports he is not happy; denies SI. Does not want to talk to psychiatrist. No interested in medications at this time. 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 Trying to get into [facility] program [facility]  Chronic Knee Pain Ambulates with use of a motorized wheelchair. Reports chair occasionally shorts out on him; been trying to get a new part through insurance, has been denied. Got this chair around a year ago. Trying to get a more durable walker through [employer]; cannot take his current chair to church or bowling easily. Has a nurse who comes Mon-Thurs to help him out.  Other Seeing cardiologist 05/2024; planning to get monitor  PERTINENT  PMH / PSH: T2DM, HTN, HLD, morbid obesity  OBJECTIVE:   BP (!) 156/82   Pulse 74   Ht 5' 7 (1.702 m)   Wt (!) 420 lb (190.5 kg)   SpO2 98%   BMI 65.78 kg/m   Cardiovascular: Regular rate and rhythm, no murmurs/rubs/gallops. Respiratory: Normal work of breathing on room air. Clear to auscultation bilaterally; no wheezes,  crackles. Abdomen: Bowel sounds present and normoactive bilaterally. Soft, protuberant, nontender.  ASSESSMENT/PLAN:   Assessment & Plan Type 2 diabetes mellitus with diabetic neuropathy, without long-term current use of insulin  (HCC) A1c today of 6.1. Well controlled even off Jardiance ; don't see need to restart this at this time. - Eye exam scheduled for 08/2024 - Decrease Mounjaro  from 15 mg weekly to 10 mg weekly to help with bowel issues - Labs: BMP for GFR, urine albumin/Cr ratio Acute cough Low concern for infection given normal exam findings and reassuring vitals. Patient without known history of COPD, asthma. Suspect possible mild viral illness versus allergy. No further intervention indicated at this time. Chronic pain of both knees Knee Xrays ordered 12/2023, patient stil to get; can still use same order Meloxicam  for pain and lidocaine  patches; refilling meloxicam  - Put order through DME agency for bariatric shower chair Anxiety and depression Patient without SI at today's visit, PHQ9 of 17 with negative q9. Not interested in therapy or medications at this time. - Encouraged patient to continue activities that bring him joy - He reports he will get in touch with our office if mood worsens Primary hypertension - Increase amlodipine -olmesartan  from 5-20 to 10-20 mg daily    Alan Flies, MD [facility]

## 2024-04-07 NOTE — Assessment & Plan Note (Signed)
-   Increase amlodipine -olmesartan  from 5-20 to 10-20 mg daily

## 2024-04-08 ENCOUNTER — Ambulatory Visit: Payer: Self-pay

## 2024-04-08 ENCOUNTER — Telehealth: Payer: Self-pay

## 2024-04-08 DIAGNOSIS — M25561 Pain in right knee: Secondary | ICD-10-CM | POA: Diagnosis not present

## 2024-04-08 LAB — BASIC METABOLIC PANEL WITH GFR
BUN/Creatinine Ratio: 9 — ABNORMAL LOW (ref 10–24)
BUN: 9 mg/dL (ref 8–27)
CO2: 27 mmol/L (ref 20–29)
Calcium: 8.7 mg/dL (ref 8.6–10.2)
Chloride: 99 mmol/L (ref 96–106)
Creatinine, Ser: 1.02 mg/dL (ref 0.76–1.27)
Glucose: 86 mg/dL (ref 70–99)
Potassium: 3.7 mmol/L (ref 3.5–5.2)
Sodium: 140 mmol/L (ref 134–144)
eGFR: 84 mL/min/1.73 (ref >59)

## 2024-04-08 LAB — MICROALBUMIN / CREATININE URINE RATIO
Creatinine, Urine: 164.8 mg/dL
Microalb/Creat Ratio: 22 mg/g{creat} (ref 0–29)
Microalbumin, Urine: 36.1 ug/mL

## 2024-04-08 NOTE — Telephone Encounter (Signed)
 Receipt confirmed by [employer].   Chiquita JAYSON English, RN

## 2024-04-08 NOTE — Telephone Encounter (Signed)
 Community message sent to [employer] for Bariatric shower chair.   Will await response.   Chiquita JAYSON English, RN

## 2024-04-09 DIAGNOSIS — M25561 Pain in right knee: Secondary | ICD-10-CM | POA: Diagnosis not present

## 2024-04-10 DIAGNOSIS — M25561 Pain in right knee: Secondary | ICD-10-CM | POA: Diagnosis not present

## 2024-04-14 DIAGNOSIS — M25561 Pain in right knee: Secondary | ICD-10-CM | POA: Diagnosis not present

## 2024-04-15 DIAGNOSIS — M25561 Pain in right knee: Secondary | ICD-10-CM | POA: Diagnosis not present

## 2024-04-16 DIAGNOSIS — M25561 Pain in right knee: Secondary | ICD-10-CM | POA: Diagnosis not present

## 2024-04-17 DIAGNOSIS — M25561 Pain in right knee: Secondary | ICD-10-CM | POA: Diagnosis not present

## 2024-04-21 DIAGNOSIS — M25561 Pain in right knee: Secondary | ICD-10-CM | POA: Diagnosis not present

## 2024-04-22 DIAGNOSIS — M25561 Pain in right knee: Secondary | ICD-10-CM | POA: Diagnosis not present

## 2024-04-23 DIAGNOSIS — M25561 Pain in right knee: Secondary | ICD-10-CM | POA: Diagnosis not present

## 2024-04-24 DIAGNOSIS — M25561 Pain in right knee: Secondary | ICD-10-CM | POA: Diagnosis not present

## 2024-04-28 DIAGNOSIS — M25561 Pain in right knee: Secondary | ICD-10-CM | POA: Diagnosis not present

## 2024-04-28 DIAGNOSIS — E119 Type 2 diabetes mellitus without complications: Secondary | ICD-10-CM | POA: Diagnosis not present

## 2024-04-28 DIAGNOSIS — N3946 Mixed incontinence: Secondary | ICD-10-CM | POA: Diagnosis not present

## 2024-04-29 DIAGNOSIS — M25561 Pain in right knee: Secondary | ICD-10-CM | POA: Diagnosis not present

## 2024-04-30 DIAGNOSIS — M25561 Pain in right knee: Secondary | ICD-10-CM | POA: Diagnosis not present

## 2024-04-30 NOTE — Progress Notes (Unsigned)
    SUBJECTIVE:   CHIEF COMPLAINT / HPI:   ALVESTER EADS is a 63 y.o. male presenting for follow-up of T2DM and HTN with recent medication changes.  T2DM Mounjaro  reduced from 15 mg to 10 mg weekly on 8/25 given GI side effects.  ***  HTN Amlodipine -olmesartan  increased from 5-20 to 10-20 mg daily at 8/25 OV. ***  Healthcare Maintenance - Flu shot - Covid booster - Shingles - Diabetic eye exam - scheduled for 08/2024  PERTINENT  PMH / PSH: T2DM, HTN, HLD, morbid obesity with chronic knee pain  OBJECTIVE:   There were no vitals taken for this visit.  ***  ASSESSMENT/PLAN:   Assessment & Plan      Alan Flies, MD [facility]

## 2024-05-01 ENCOUNTER — Ambulatory Visit

## 2024-05-01 DIAGNOSIS — M25561 Pain in right knee: Secondary | ICD-10-CM | POA: Diagnosis not present

## 2024-05-05 DIAGNOSIS — M25561 Pain in right knee: Secondary | ICD-10-CM | POA: Diagnosis not present

## 2024-05-06 DIAGNOSIS — M25561 Pain in right knee: Secondary | ICD-10-CM | POA: Diagnosis not present

## 2024-05-07 DIAGNOSIS — M25561 Pain in right knee: Secondary | ICD-10-CM | POA: Diagnosis not present

## 2024-05-08 DIAGNOSIS — M25561 Pain in right knee: Secondary | ICD-10-CM | POA: Diagnosis not present

## 2024-05-23 ENCOUNTER — Ambulatory Visit: Admitting: Cardiology

## 2024-06-02 ENCOUNTER — Telehealth: Payer: Self-pay

## 2024-06-02 DIAGNOSIS — E1110 Type 2 diabetes mellitus with ketoacidosis without coma: Secondary | ICD-10-CM

## 2024-06-02 NOTE — Progress Notes (Signed)
 Complex Care Management Note  Care Guide Note 06/02/2024 Name: Craig [name] MRN: [id number] DOB: 1961/08/03  Craig [name] is a 64 y.o. year old male who sees [name], MD for primary care. I reached out to Peggye MARLA Sharps by phone today to offer complex care management services.  Craig [name] was given information about Complex Care Management services today including:   The Complex Care Management services include support from the care team which includes your Nurse Care Manager, Clinical Social Worker, or Pharmacist.  The Complex Care Management team is here to help remove barriers to the health concerns and goals most important to you. Complex Care Management services are voluntary, and the patient may decline or stop services at any time by request to their care team member.   Complex Care Management Consent Status: Patient agreed to services and verbal consent obtained.   Follow up plan:  Telephone appointment with complex care management team member scheduled for:  06/10/24 at 3:30 p.m.   Encounter Outcome:  Patient Scheduled  Dreama [facility]  [facility]BCI Assistant Direct Dial: [phone]  Fax: [phone]

## 2024-06-03 DIAGNOSIS — M25561 Pain in right knee: Secondary | ICD-10-CM | POA: Diagnosis not present

## 2024-06-05 DIAGNOSIS — M25561 Pain in right knee: Secondary | ICD-10-CM | POA: Diagnosis not present

## 2024-06-10 ENCOUNTER — Telehealth: Payer: Self-pay | Admitting: *Deleted

## 2024-06-10 DIAGNOSIS — M25561 Pain in right knee: Secondary | ICD-10-CM | POA: Diagnosis not present

## 2024-06-11 ENCOUNTER — Other Ambulatory Visit: Payer: Self-pay

## 2024-06-11 ENCOUNTER — Other Ambulatory Visit: Payer: Self-pay | Admitting: Cardiology

## 2024-06-11 DIAGNOSIS — G8929 Other chronic pain: Secondary | ICD-10-CM

## 2024-06-11 DIAGNOSIS — I10 Essential (primary) hypertension: Secondary | ICD-10-CM

## 2024-06-16 DIAGNOSIS — M25561 Pain in right knee: Secondary | ICD-10-CM | POA: Diagnosis not present

## 2024-06-17 DIAGNOSIS — M25561 Pain in right knee: Secondary | ICD-10-CM | POA: Diagnosis not present

## 2024-06-18 ENCOUNTER — Ambulatory Visit: Admitting: Physician Assistant

## 2024-06-18 DIAGNOSIS — M25561 Pain in right knee: Secondary | ICD-10-CM | POA: Diagnosis not present

## 2024-06-18 NOTE — Progress Notes (Unsigned)
 Cardiology Office Note:    Date:  06/19/2024   ID:  Craig [name], DOB [birth year], MRN [id number]  PCP:  [name], MD   [facility] Providers Cardiologist:  Lonni LITTIE Nanas, MD     Referring MD: [name], MD   Chief complaint: Annual follow-up of ectopy/BP     History of Present Illness:   Craig [name] is a 63 y.o. male with a hx of orthostatic hypotension, primary hypertension, PVCs, gastric cancer, GERD, hyperlipidemia, type 2 diabetes presenting to the clinic for annual follow-up of hypertension and ectopy.  Episodes of orthostatic hypotension in 12/2019.  At that time, he reported that he would have dizziness about 3-4 times per week that would occur multiple times per day.  His Victoza , gabapentin , valsartan , and carvedilol  were discontinued in response.  He underwent echocardiogram on 01/05/2020 that showed EF 55-60%, no regional wall motion abnormalities, normal RV function, aortic sclerosis without stenosis. He was seen by Dr. [name] in 01/2021.  At that time, he also complained of palpitations.  Wore a cardiac monitor that showed frequent PVCs (12.1% burden).  He was started on metoprolol  succinate 50 mg daily.  Second cardiac monitor in 10/2020 that showed frequent PVCs (10% burden).  His metoprolol  succinate was increased to 50 mg twice daily.  Was doing well from a cardiovascular standpoint when last seen in office by [name] in August 2024.  Patient declined cardiac event monitor at this visit to reassess PVC burden, patient declined.  He was planning on joining [employer] to increase his physical activity around the time of this visit.  Presents independently to clinic in a motorized scooter for annual follow up, appears stable from a cardiovascular standpoint. He denies palpitations, dyspnea, n, v,  dark/tarry/bloody stools, hematuria, dizziness, syncope, weight gain. Reports 1 episode of right sided chest pain while on the phone with his  mother discussing recent financial stressors. Chest pain was dull, coming and going, lasted around an hour. Unsure whether activity/rest would relieve the dull CP he experienced, as he is sedentary. Took 2 baby aspirins and took a nap, the CP was resolved on waking. Denies associated SOB, nausea, palpitations, near syncope with singular episode of chest pain. Patient took aspirin , drank some water, and went to sleep. Patient is relatively immobile, uses a scooter to get around, can walk for a short period of time with a cane. Has a nurse who comes to help with his bathing, laundry. He states he can stand for a short period of time to do the dishes, but denies most other activities. 421 lb in office today. Sleeps with 4 pillows under him at his baseline. Reports occasional bilateral LE edema that changes depending on how much salty food he eats, swelling generally improves after elevating legs, he wears compression stockings regularly, denies any leg pain or unilateral leg swelling. 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    ROS:   Please see the history of present illness.    All other systems reviewed and are negative.     Past Medical History:  Diagnosis Date   Anemia 12/15/2014   Anxiety and depression 08/17/2017   Bilateral carpal tunnel syndrome 08/03/2016   Cancer (HCC) abdomen   Controlled type 2 diabetes mellitus without complication, without long-term current use of insulin  (HCC) 09/17/2012   Well-controlled.  No history of insulin  use.   Dizziness 03/27/2012   GERD (gastroesophageal reflux disease) 03/27/2012   History  of gastric cancer 02/14/2012   Followed by Dr. Vicci in [place].    Hyperlipidemia associated with type 2 diabetes mellitus (HCC) 01/01/2013   Morbid obesity with BMI of 50.0-59.9, adult (HCC) 06/05/2012   Onychomycosis 08/27/2018   Orthostatic hypotension 11/10/2019   Perioral numbness 07/30/2018   Poor dentition 12/02/2018   Primary hypertension  04/30/2012   Right hand paresthesia 03/26/2017    Past Surgical History:  Procedure Laterality Date   ABDOMINAL SURGERY      Current Medications: Current Meds  Medication Sig   ACCU-CHEK GUIDE TEST test strip TO USE WITH GLUCOMETER   Accu-Chek Softclix Lancets lancets USE AS INSTRUCTED   amlodipine -olmesartan  (AZOR ) 10-20 MG tablet TAKE 1 TABLET BY MOUTH EVERY DAY   antiseptic oral rinse (BIOTENE) LIQD 15 mLs by Mouth Rinse route as needed for dry mouth.   atorvastatin  (LIPITOR) 80 MG tablet TAKE 1 TABLET BY MOUTH EVERY DAY   Blood Glucose Monitoring Suppl (ACCU-CHEK GUIDE) w/Device KIT Inject 1 kit into the skin daily.   JARDIANCE  10 MG TABS tablet TAKE 1 TABLET BY MOUTH EVERY DAY   lidocaine  (LIDODERM ) 5 % Place 1 patch onto the skin daily. Remove & Discard patch within 12 hours or as directed by MD   meloxicam  (MOBIC ) 7.5 MG tablet TAKE 1 TABLET BY MOUTH EVERY DAY   metoprolol  succinate (TOPROL -XL) 50 MG 24 hr tablet TAKE 1 TABLET BY MOUTH TWICE A DAY   nitroGLYCERIN (NITROSTAT) 0.4 MG SL tablet Place 1 tablet (0.4 mg total) under the tongue every 5 (five) minutes as needed for chest pain.   pantoprazole  (PROTONIX ) 40 MG tablet TAKE 1 TABLET BY MOUTH EVERY DAY   tirzepatide  (MOUNJARO ) 10 MG/0.5ML Pen Inject 10 mg into the skin once a week.     Allergies:   Patient has no known allergies.   Social History   Socioeconomic History   Marital status: Single    Spouse name: Not on file   Number of children: Not on file   Years of education: Not on file   Highest education level: 12th grade  Occupational History   Not on file  Tobacco Use   Smoking status: Former    Current packs/day: 0.00    Types: Cigarettes    Start date: 08/14/1977    Quit date: 08/14/1996    Years since quitting: 27.8    Passive exposure: Past   Smokeless tobacco: Never  Vaping Use   Vaping status: Never Used  Substance and Sexual Activity   Alcohol use: No   Drug use: Yes    Types: Cocaine, Marijuana,  Heroin    Comment: Previous Hx of substance abuse   Sexual activity: Yes  Other Topics Concern   Not on file  Social History Narrative   Not on file   Social Drivers of Health   Financial Resource Strain: Low Risk  (04/07/2024)   Overall Financial Resource Strain (CARDIA)    Difficulty of Paying Living Expenses: Not very hard  Food Insecurity: Food Insecurity Present (04/07/2024)   Hunger Vital Sign    Worried About Running Out of Food in the Last Year: Sometimes true    Ran Out of Food in the Last Year: Sometimes true  Transportation Needs: Unmet Transportation Needs (04/07/2024)   PRAPARE - Transportation    Lack of Transportation (Medical): Yes    Lack of Transportation (Non-Medical): Yes  Physical Activity: Insufficiently Active (04/07/2024)   Exercise Vital Sign    Days of Exercise per Week: 1  day    Minutes of Exercise per Session: 10 min  Stress: No Stress Concern Present (04/07/2024)   [employer] of Occupational Health - Occupational Stress Questionnaire    Feeling of Stress: Only a little  Social Connections: Socially Isolated (04/07/2024)   Social Connection and Isolation Panel    Frequency of Communication with Friends and Family: Once a week    Frequency of Social Gatherings with Friends and Family: Once a week    Attends Religious Services: 1 to 4 times per year    Active Member of [employer] or Organizations: No    Attends Banker Meetings: Not on file    Marital Status: Widowed     Family History: [name] family history includes Diabetes type II in his mother.  EKGs/Labs/Other Studies Reviewed:    The following studies were reviewed today:  EKG Interpretation Date/Time:  Thursday June 19 2024 13:41:23 EST Ventricular Rate:  62 PR Interval:  164 QRS Duration:  80 QT Interval:  416 QTC Calculation: 422 R Axis:   52  Text Interpretation: Sinus rhythm with sinus arrhythmia with occasional Premature ventricular complexes Low voltage  QRS When compared with ECG of 11-Apr-2023 11:11, QRS axis Shifted left Confirmed by Elaine Moloney [phone]) on 06/19/2024 1:53:00 PM    Recent Labs: 04/07/2024: BUN 9; Creatinine, Ser 1.02; Potassium 3.7; Sodium 140  Recent Lipid Panel    Component Value Date/Time   CHOL 142 02/01/2021 1438   TRIG 102 02/01/2021 1438   HDL 33 (L) 02/01/2021 1438   CHOLHDL 4.3 02/01/2021 1438   CHOLHDL 4.3 06/16/2015 0921   VLDL 13 06/16/2015 0921   LDLCALC 90 02/01/2021 1438   LDLDIRECT 90 07/13/2017 1606   LDLDIRECT 88 02/04/2016 1115     Physical Exam:    VS:  BP 112/76   Pulse 62   Ht 5' 10 (1.778 m)   Wt (!) 421 lb (191 kg)   SpO2 98%   BMI 60.41 kg/m        Wt Readings from Last 3 Encounters:  06/19/24 (!) 421 lb (191 kg)  04/07/24 (!) 420 lb (190.5 kg)  12/17/23 (!) 394 lb 6.4 oz (178.9 kg)     GEN:  Well nourished, morbidly obese, in no acute distress HEENT: Normal NECK:  No carotid bruits CARDIAC:  S1-S2 normal, RRR, no murmurs, rubs, gallops RESPIRATORY:  Decreased breath sounds in bilateral bases, without rales, wheezing or rhonchi  MUSCULOSKELETAL: 1+ bilateral pitting edema; No deformity  SKIN: Warm and dry NEUROLOGIC:  Alert and oriented x 3 PSYCHIATRIC:  Normal affect       Assessment & Plan Right-sided chest pain Lower extremity edema Reports single episode of right sided chest pain while on the phone with his mother discussing recent financial stressors on Saturday Episode lasted 1 hour, dull in nature, no associated SOB/nausea/palpitations/near syncope Unsure if exacerbated/relieved with exertion/rest as patient is relatively sedentary in his daily life Took two baby aspirin  and took a nap, with improvement of symptoms on waking EKG: Sinus rhythm with sinus arrhythmia, occasional PVCs, 62 bpm Symptoms appear atypical and difficult to clearly evaluate d/t inability to assess exertional component and overall low functional ability Denies recurrent CP, SOB at  rest, palpitations, near syncope, dizziness Denies orthopnea, but also sleeps with 4 pillows, possibly secondary to morbid obesity rather than volume overload.  +1 bilateral LE edema, pitting, with decreased bilateral lower lobe breath sounds.  Considered ordering BNP/proBNP, deferred d/t morbid obesity which may prevent accurate  reflect of volume status Will repeat echo to assess for cardiomyopathies and RWMAs Will let echo results guide further work up for ischemic vs volume related etiology of symptoms If ischemic work up is necessary, will need to discuss with DOD or primary cardiologist as to which imaging modality would be most appropriate for patient's morbid obesity and functional status Will prescribe nitroglycerin PRN chest pain every 5 minutes x 3, provided instruction on how to properly  Deferred diuretic at this time considering patient states he is at his baseline in terms of breathing and lower extremity edema Provided strict ER precautions for return of symptoms.  Palpitation EKG NSR as above Denies recent palpitations on increased dose of metoprolol  Continue metoprolol  50 mg twice daily Primary hypertension Reports BPs well controlled at home Continue amlodipine -olmesartan  10-20 mg daily Continue toprol -xl 50 mg twice daily BMP 04/07/2024: Creatinine 1.02, BUN 9 Hyperlipidemia, unspecified hyperlipidemia type Need updated lipid panel Will order today Continue atorvastatin  80 mg daily   Follow up in 2 months with MD or APP Proceed to ED if symptoms return or worsen.       Medication Adjustments/Labs and Tests Ordered: Current medicines are reviewed at length with the patient today.  Concerns regarding medicines are outlined above.  Orders Placed This Encounter  Procedures   Lipid Profile   EKG 12-Lead   ECHOCARDIOGRAM COMPLETE   Meds ordered this encounter  Medications   nitroGLYCERIN (NITROSTAT) 0.4 MG SL tablet    Sig: Place 1 tablet (0.4 mg total) under the  tongue every 5 (five) minutes as needed for chest pain.    Dispense:  25 tablet    Refill:  3    Patient Instructions  Medication Instructions:  Star Nitorglycerin 0.4 mg take one tablet as needed for chest pain *If you need a refill on your cardiac medications before your next appointment, please call your pharmacy*  Lab Work: Today- Fasting Lipids If you have labs (blood work) drawn today and your tests are completely normal, you will receive your results only by: MyChart Message (if you have MyChart) OR A paper copy in the mail If you have any lab test that is abnormal or we need to change your treatment, we will call you to review the results.  Testing/Procedures: Your physician has requested that you have an echocardiogram. Echocardiography is a painless test that uses sound waves to create images of your heart. It provides your doctor with information about the size and shape of your heart and how well your heart's chambers and valves are working. This procedure takes approximately one hour. There are no restrictions for this procedure. Please do NOT wear cologne, perfume, aftershave, or lotions (deodorant is allowed). Please arrive 15 minutes prior to your appointment time.  Please note: We ask at that you not bring children with you during ultrasound (echo/ vascular) testing. Due to room size and safety concerns, children are not allowed in the ultrasound rooms during exams. Our front office staff cannot provide observation of children in our lobby area while testing is being conducted. An adult accompanying a patient to their appointment will only be allowed in the ultrasound room at the discretion of the ultrasound technician under special circumstances. We apologize for any inconvenience.   Follow-Up: At [facility], you and your health needs are our priority.  As part of our continuing mission to provide you with exceptional heart care, our providers are all part of one  team.  This team includes your primary  Cardiologist (physician) and Advanced Practice Providers or APPs (Physician Assistants and Nurse Practitioners) who all work together to provide you with the care you need, when you need it.  Your next appointment:   2 month(s)   Provider:   Miriam Shams, NP       or One of our Advanced Practice Providers (APPs): Morse Clause, PA-C  Lamarr Satterfield, NP Miriam Shams, NP  Olivia Pavy, PA-C Josefa Beauvais, NP  Leontine Salen, PA-C Orren Fabry, PA-C  [place], [place] Jackee Alberts, NP  Damien Braver, NP Jon Hails, PA-C  Waddell Donath, PA-C    Dayna Dunn, PA-C  Scott Weaver, PA-C Lum Louis, NP Katlyn West, NP Callie Goodrich, PA-C  Xika Zhao, NP Sheng Haley, PA-C    Kathleen Johnson, PA-C                Signed, Gwyndolyn Guilford E Domenico Achord, NP  06/19/2024 3:50 PM    [facility]

## 2024-06-19 ENCOUNTER — Encounter: Payer: Self-pay | Admitting: Physician Assistant

## 2024-06-19 ENCOUNTER — Ambulatory Visit: Attending: Physician Assistant | Admitting: Emergency Medicine

## 2024-06-19 VITALS — BP 112/76 | HR 62 | Ht 70.0 in | Wt >= 6400 oz

## 2024-06-19 DIAGNOSIS — M25561 Pain in right knee: Secondary | ICD-10-CM | POA: Diagnosis not present

## 2024-06-19 DIAGNOSIS — E785 Hyperlipidemia, unspecified: Secondary | ICD-10-CM | POA: Insufficient documentation

## 2024-06-19 DIAGNOSIS — I10 Essential (primary) hypertension: Secondary | ICD-10-CM | POA: Diagnosis not present

## 2024-06-19 DIAGNOSIS — R002 Palpitations: Secondary | ICD-10-CM | POA: Insufficient documentation

## 2024-06-19 DIAGNOSIS — R600 Localized edema: Secondary | ICD-10-CM | POA: Diagnosis not present

## 2024-06-19 DIAGNOSIS — R079 Chest pain, unspecified: Secondary | ICD-10-CM | POA: Diagnosis not present

## 2024-06-19 MED ORDER — NITROGLYCERIN 0.4 MG SL SUBL: 0.4000 mg | SUBLINGUAL_TABLET | SUBLINGUAL | 3 refills | Status: AC | PRN

## 2024-06-19 NOTE — Assessment & Plan Note (Addendum)
 Reports BPs well controlled at home Continue amlodipine -olmesartan  10-20 mg daily Continue toprol -xl 50 mg twice daily BMP 04/07/2024: Creatinine 1.02, BUN 9

## 2024-06-19 NOTE — Patient Instructions (Signed)
 Medication Instructions:  Star Nitorglycerin 0.4 mg take one tablet as needed for chest pain *If you need a refill on your cardiac medications before your next appointment, please call your pharmacy*  Lab Work: Today- Fasting Lipids If you have labs (blood work) drawn today and your tests are completely normal, you will receive your results only by: MyChart Message (if you have MyChart) OR A paper copy in the mail If you have any lab test that is abnormal or we need to change your treatment, we will call you to review the results.  Testing/Procedures: Your physician has requested that you have an echocardiogram. Echocardiography is a painless test that uses sound waves to create images of your heart. It provides your doctor with information about the size and shape of your heart and how well your heart's chambers and valves are working. This procedure takes approximately one hour. There are no restrictions for this procedure. Please do NOT wear cologne, perfume, aftershave, or lotions (deodorant is allowed). Please arrive 15 minutes prior to your appointment time.  Please note: We ask at that you not bring children with you during ultrasound (echo/ vascular) testing. Due to room size and safety concerns, children are not allowed in the ultrasound rooms during exams. Our front office staff cannot provide observation of children in our lobby area while testing is being conducted. An adult accompanying a patient to their appointment will only be allowed in the ultrasound room at the discretion of the ultrasound technician under special circumstances. We apologize for any inconvenience.   Follow-Up: At [facility], you and your health needs are our priority.  As part of our continuing mission to provide you with exceptional heart care, our providers are all part of one team.  This team includes your primary Cardiologist (physician) and Advanced Practice Providers or APPs (Physician  Assistants and Nurse Practitioners) who all work together to provide you with the care you need, when you need it.  Your next appointment:   2 month(s)   Provider:   Miriam Shams, NP       or One of our Advanced Practice Providers (APPs): Morse Clause, PA-C  Lamarr Satterfield, NP Miriam Shams, NP  Olivia Pavy, PA-C Josefa Beauvais, NP  Leontine Salen, PA-C Orren Fabry, PA-C  [place], PA-C Ernest Dick, NP  Damien Braver, NP Jon Hails, PA-C  Waddell Donath, PA-C    Dayna Dunn, PA-C  Scott Weaver, PA-C Lum Louis, NP Katlyn West, NP Callie Goodrich, PA-C  Xika Zhao, NP Sheng Haley, PA-C    Kathleen Johnson, PA-C

## 2024-06-20 DIAGNOSIS — E119 Type 2 diabetes mellitus without complications: Secondary | ICD-10-CM | POA: Diagnosis not present

## 2024-06-20 DIAGNOSIS — N3946 Mixed incontinence: Secondary | ICD-10-CM | POA: Diagnosis not present

## 2024-06-20 LAB — LIPID PANEL
Chol/HDL Ratio: 3.7 ratio (ref 0.0–5.0)
Cholesterol, Total: 139 mg/dL (ref 100–199)
HDL: 38 mg/dL — ABNORMAL LOW (ref >39)
LDL Chol Calc (NIH): 86 mg/dL (ref 0–99)
Triglycerides: 73 mg/dL (ref 0–149)
VLDL Cholesterol Cal: 15 mg/dL (ref 5–40)

## 2024-06-23 ENCOUNTER — Ambulatory Visit: Payer: Self-pay | Admitting: Emergency Medicine

## 2024-06-24 DIAGNOSIS — M25561 Pain in right knee: Secondary | ICD-10-CM | POA: Diagnosis not present

## 2024-06-26 ENCOUNTER — Other Ambulatory Visit: Payer: Self-pay

## 2024-06-26 DIAGNOSIS — E785 Hyperlipidemia, unspecified: Secondary | ICD-10-CM

## 2024-06-26 DIAGNOSIS — M25561 Pain in right knee: Secondary | ICD-10-CM | POA: Diagnosis not present

## 2024-06-26 DIAGNOSIS — E1169 Type 2 diabetes mellitus with other specified complication: Secondary | ICD-10-CM

## 2024-06-26 MED ORDER — ATORVASTATIN CALCIUM 80 MG PO TABS: 80.0000 mg | ORAL_TABLET | Freq: Every day | ORAL | 3 refills | Status: AC

## 2024-06-30 DIAGNOSIS — M25561 Pain in right knee: Secondary | ICD-10-CM | POA: Diagnosis not present

## 2024-07-04 ENCOUNTER — Telehealth: Payer: Self-pay | Admitting: *Deleted

## 2024-07-08 DIAGNOSIS — M25561 Pain in right knee: Secondary | ICD-10-CM | POA: Diagnosis not present

## 2024-07-10 DIAGNOSIS — M25561 Pain in right knee: Secondary | ICD-10-CM | POA: Diagnosis not present

## 2024-07-23 ENCOUNTER — Other Ambulatory Visit: Payer: Self-pay | Admitting: *Deleted

## 2024-07-23 ENCOUNTER — Other Ambulatory Visit: Payer: Self-pay

## 2024-07-23 DIAGNOSIS — I10 Essential (primary) hypertension: Secondary | ICD-10-CM

## 2024-07-23 NOTE — Patient Outreach (Signed)
 Complex Care Management   Visit Note  07/23/2024  Name:  Craig [name] MRN: [id number] DOB: 1960-12-13  Situation: Referral received for Complex Care Management related to SDOH Barriers:  Food insecurity and Diabetes I obtained verbal consent from Patient.  Visit completed with Patient  on the phone  Background:   Past Medical History:  Diagnosis Date   Anemia 12/15/2014   Anxiety and depression 08/17/2017   Bilateral carpal tunnel syndrome 08/03/2016   Cancer (HCC) abdomen   Controlled type 2 diabetes mellitus without complication, without long-term current use of insulin  (HCC) 09/17/2012   Well-controlled.  No history of insulin  use.   Dizziness 03/27/2012   GERD (gastroesophageal reflux disease) 03/27/2012   History of gastric cancer 02/14/2012   Followed by Dr. Vicci in [place].    Hyperlipidemia associated with type 2 diabetes mellitus (HCC) 01/01/2013   Morbid obesity with BMI of 50.0-59.9, adult (HCC) 06/05/2012   Onychomycosis 08/27/2018   Orthostatic hypotension 11/10/2019   Perioral numbness 07/30/2018   Poor dentition 12/02/2018   Primary hypertension 04/30/2012   Right hand paresthesia 03/26/2017    Assessment: Patient Reported Symptoms:  Cognitive Cognitive Status: No symptoms reported, Able to follow simple commands, Alert and oriented to person, place, and time, Insightful and able to interpret abstract concepts, Normal speech and language skills Cognitive/Intellectual Conditions Management [RPT]: None reported or documented in medical history or problem list   Health Maintenance Behaviors: Annual physical exam, Sleep adequate, Stress management Healing Pattern: Average Health Facilitated by: Stress management, Healthy diet  Neurological Neurological Review of Symptoms: Headaches Neurological Management Strategies: Adequate rest, Routine screening Neurological Self-Management Outcome: 3 (uncertain)  HEENT HEENT Symptoms Reported: No symptoms reported HEENT Management  Strategies: Adequate rest, Routine screening    Cardiovascular Cardiovascular Symptoms Reported: No symptoms reported Cardiovascular Management Strategies: Adequate rest, Routine screening Cardiovascular Self-Management Outcome: 4 (good)  Respiratory Respiratory Symptoms Reported: No symptoms reported Respiratory Management Strategies: Adequate rest, Routine screening Respiratory Self-Management Outcome: 4 (good)  Endocrine Endocrine Symptoms Reported: No symptoms reported Is patient diabetic?: Yes Is patient checking blood sugars at home?: Yes List most recent blood sugar readings, include date and time of day: Patient reports that blood sugar today was 145. Endocrine Comment: Patient reports that highest blood sugar as been 145 and lowest has been 101.  Gastrointestinal Gastrointestinal Symptoms Reported: No symptoms reported Gastrointestinal Management Strategies: Adequate rest Gastrointestinal Self-Management Outcome: 4 (good)    Genitourinary Genitourinary Symptoms Reported: No symptoms reported Genitourinary Management Strategies: Adequate rest Genitourinary Self-Management Outcome: 4 (good)  Integumentary Integumentary Symptoms Reported: No symptoms reported Skin Management Strategies: Adequate rest, Routine screening Skin Self-Management Outcome: 4 (good)  Musculoskeletal Musculoskelatal Symptoms Reviewed: Difficulty walking, Limited mobility Musculoskeletal Management Strategies: Adequate rest, Routine screening Musculoskeletal Self-Management Outcome: 3 (uncertain) Falls in the past year?: Yes Number of falls in past year: 2 or more Was there an injury with Fall?: Yes Fall Risk Category Calculator: 3 Patient Fall Risk Level: High Fall Risk Patient at Risk for Falls Due to: History of fall(s), Impaired mobility (Patient reports that he has a home mobile alert system) Fall risk Follow up: Falls evaluation completed, Education provided, Falls prevention discussed  Psychosocial  Psychosocial Symptoms Reported: No symptoms reported Behavioral Management Strategies: Support system Behavioral Health Self-Management Outcome: 4 (good) Major Change/Loss/Stressor/Fears (CP): Denies Quality of Family Relationships: unable to assess    07/23/2024    PHQ2-9 Depression Screening   Little interest or pleasure in doing things Not at all  Feeling down, depressed,  or hopeless Several days  PHQ-2 - Total Score 1  Trouble falling or staying asleep, or sleeping too much    Feeling tired or having little energy    Poor appetite or overeating     Feeling bad about yourself - or that you are a failure or have let yourself or your family down    Trouble concentrating on things, such as reading the newspaper or watching television    Moving or speaking so slowly that other people could have noticed.  Or the opposite - being so fidgety or restless that you have been moving around a lot more than usual    Thoughts that you would be better off dead, or hurting yourself in some way    PHQ2-9 Total Score    If you checked off any problems, how difficult have these problems made it for you to do your work, take care of things at home, or get along with other people    Depression Interventions/Treatment      There were no vitals filed for this visit. Pain Scale: 0-10 Pain Score: 0-No pain  Medications Reviewed Today     Reviewed by Jorja Nichole LABOR, RN (Case Manager) on 07/23/24 at 1446  Med List Status: <None>   Medication Order Taking? Sig Documenting Provider Last Dose Status Informant  ACCU-CHEK GUIDE TEST test strip [id number] Yes TO USE WITH GLUCOMETER Dartha Geralds, DO  Active   Accu-Chek Softclix Lancets lancets [id number] Yes USE AS INSTRUCTED Jennelle Riis, MD  Active   amlodipine -olmesartan  (AZOR ) 10-20 MG tablet [id number] Yes TAKE 1 TABLET BY MOUTH EVERY DAY [name], MD  Active   antiseptic oral rinse KEENAN) LIQD [id number] Yes 15 mLs by Mouth Rinse route as  needed for dry mouth. [provider]  Active            Med Note ZACARIAS BENTON VEAR Pablo Sep 19, 2021  1:38 PM)    atorvastatin  (LIPITOR) 80 MG tablet [id number] Yes Take 1 tablet (80 mg total) by mouth daily. [name], MD  Active   Blood Glucose Monitoring Suppl (ACCU-CHEK GUIDE) w/Device KIT [id number] Yes Inject 1 kit into the skin daily. Autry-Lott, Rojean, DO  Active   JARDIANCE  10 MG TABS tablet [id number] Yes TAKE 1 TABLET BY MOUTH EVERY DAY Dameron, Marisa, DO  Active   lidocaine  (LIDODERM ) 5 % [id number] Yes Place 1 patch onto the skin daily. Remove & Discard patch within 12 hours or as directed by MD Marlee Lynwood NOVAK, MD  Active   meloxicam  (MOBIC ) 7.5 MG tablet [id number] Yes TAKE 1 TABLET BY MOUTH EVERY DAY Nemecek, Amanda, MD  Active   metoprolol  succinate (TOPROL -XL) 50 MG 24 hr tablet [id number] Yes TAKE 1 TABLET BY MOUTH TWICE A DAY Vicci Rollo SAUNDERS, PA-C  Active   nitroGLYCERIN  (NITROSTAT ) 0.4 MG SL tablet [id number] Yes Place 1 tablet (0.4 mg total) under the tongue every 5 (five) minutes as needed for chest pain. Campbell, Kenzie E, NP  Active   pantoprazole  (PROTONIX ) 40 MG tablet [id number] Yes TAKE 1 TABLET BY MOUTH EVERY DAY Sowell, Brandon, MD  Active   tirzepatide  (MOUNJARO ) 10 MG/0.5ML Pen [id number] Yes Inject 10 mg into the skin once a week. [name], MD  Active             Recommendation:   PCP Follow-up Specialty provider follow-up : Cardiology-07/31/24 Continue Current Plan of Care  Follow Up Plan:   Telephone follow-up in 1 month: 08/28/24 @  2 pm  Elan Brainerd, RN, BSN, Theatre Manager [employer] [phone]

## 2024-07-23 NOTE — Patient Instructions (Signed)
 Visit Information  [name] was given information about Medicaid Managed Care team care coordination services as a part of their Healthy [facility] Medicaid benefit. Craig [name]   If you would like to schedule transportation through your Healthy [facility] plan, please call the following number at least 2 days in advance of your appointment: [phone]  For information about your ride after you set it up, call Ride Assist at [phone]. Use this number to activate a Will Call pickup, or if your transportation is late for a scheduled pickup. Use this number, too, if you need to make a change or cancel a previously scheduled reservation.  If you need transportation services right away, call [phone]. The after-hours call center is staffed 24 hours to handle ride assistance and urgent reservation requests (including discharges) 365 days a year. Urgent trips include sick visits, hospital discharge requests and life-sustaining treatment.  Call the [facility] Line at [phone], at any time, 24 hours a day, 7 days a week. If you are in danger or need immediate medical attention call 911.   Please see education materials related to Diabetes provided by MyChart link.  Care plan and visit instructions communicated with the patient verbally today. Patient agrees to receive a copy in MyChart. Active MyChart status and patient understanding of how to access instructions and care plan via MyChart confirmed with patient.     Telephone follow up appointment with Managed Medicaid care management team member scheduled for: 08/28/24 @ 2 pm  Craig Shugars, RN, BSN, ACM RN Care Manager [facility] [phone]   Following is a copy of your plan of care:   Goals Addressed             This Visit's Progress    VBCI RN Care Plan- Diabetes       Problems:  Care Coordination needs related to Food Insecurity  Chronic Disease Management support and education needs related to  DMII  Goal: Over the next 90 days the Patient will attend all scheduled medical appointments: PCP and Specialist  as evidenced by keeping all schedule appointments.          continue to work with Medical Illustrator and/or Social Worker to address care management and care coordination needs related to DMII as evidenced by adherence to care management team scheduled appointments     take all medications exactly as prescribed and will call provider for medication related questions as evidenced by compliance.      verbalize basic understanding of DMII disease process and self health management plan as evidenced by verbal explanation, recognize, monitor symptoms and life style changes.  work with child psychotherapist to address Limited access to food related to the management of DMII as evidenced by review of electronic medical record and patient or social worker report      Interventions:   Diabetes Interventions: Assessed patient's understanding of A1c goal: <7% Provided education to patient about basic DM disease process Reviewed medications with patient and discussed importance of medication adherence Counseled on importance of regular laboratory monitoring as prescribed Discussed plans with patient for ongoing care management follow up and provided patient with direct contact information for care management team Referral made to social work team for assistance with food insecurity  Review of patient status, including review of consultants reports, relevant laboratory and other test results, and medications completed Screening for signs and symptoms of depression related to chronic disease state  Assessed social determinant of health barriers Lab  Results  Component Value Date   HGBA1C 6.1 04/07/2024    Patient Self-Care Activities:  Attend all scheduled provider appointments Attend church or other social activities Call pharmacy for medication refills 3-7 days in advance of running out of  medications Call provider office for new concerns or questions  Take medications as prescribed   Work with the social worker to address care coordination needs and will continue to work with the clinical team to address health care and disease management related needs schedule appointment with eye doctor check blood sugar at prescribed times: before meals and at bedtime and when you have symptoms of low or high blood sugar check feet daily for cuts, sores or redness enter blood sugar readings and medication or insulin  into daily log take the blood sugar log to all doctor visits drink 6 to 8 glasses of water each day eat fish at least once per week fill half of plate with vegetables limit fast food meals to no more than 1 per week manage portion size keep feet up while sitting wash and dry feet carefully every day  Plan:  Telephone follow up appointment with care management team member scheduled for:  08/28/24 @ 2 pm.

## 2024-07-24 ENCOUNTER — Telehealth: Payer: Self-pay

## 2024-07-24 DIAGNOSIS — E119 Type 2 diabetes mellitus without complications: Secondary | ICD-10-CM | POA: Diagnosis not present

## 2024-07-24 DIAGNOSIS — N3946 Mixed incontinence: Secondary | ICD-10-CM | POA: Diagnosis not present

## 2024-07-24 NOTE — Progress Notes (Signed)
 Complex Care Management Note Care Guide Note  07/24/2024 Name: Craig [name] MRN: [id number] DOB: [birth year]  Craig [name] is a 63 y.o. year old male who is a primary care patient of [name], MD . The community resource team was consulted for assistance with Transportation Needs  and Food Insecurity  SDOH screenings and interventions completed:  Yes  Social Drivers of Health From This Encounter   Food Insecurity: Food Insecurity Present (07/24/2024)   Epic    Worried About Programme Researcher, Broadcasting/film/video in the Last Year: Sometimes true    Ran Out of Food in the Last Year: Sometimes true  Housing: Low Risk (07/24/2024)   Epic    Unable to Pay for Housing in the Last Year: No    Number of Times Moved in the Last Year: 0    Homeless in the Last Year: No  Financial Resource Strain: Medium Risk (07/24/2024)   Overall Financial Resource Strain (CARDIA)    Difficulty of Paying Living Expenses: Somewhat hard  Transportation Needs: Unmet Transportation Needs (07/24/2024)   Epic    Lack of Transportation (Medical): Yes    Lack of Transportation (Non-Medical): Yes  Utilities: Not At Risk (07/24/2024)   Epic    Threatened with loss of utilities: No    SDOH Interventions Today    Flowsheet Row Most Recent Value  SDOH Interventions   Food Insecurity Interventions Other (Comment)  [Patient stated he is receiving SNAP benefits and was given a environmental manager for [facility]. Mailed information for the [employer] and [employer]. Also mailed information for Healthy [employer] extra benefits.]  Transportation Interventions Other (Comment), Payor Benefit, SCAT (Specialized Community Area Transporation)  [Patient is using SCAT transportation. I will mail information for Healthy [employer] transportation as requested.]     Care guide performed the following interventions: Patient provided with information about care guide support team and interviewed to confirm resource  needs.  Follow Up Plan:  Client will contact me if he does not received mailed information. Gave patient my name and contact number.  Encounter Outcome:  Patient Visit Completed  Virlee Stroschein [facility]  [facility] Guide Direct Dial: [phone]  Fax: [phone] Website: [email or web address]

## 2024-07-25 ENCOUNTER — Other Ambulatory Visit: Payer: Self-pay

## 2024-07-25 NOTE — Patient Instructions (Signed)
 Visit Information  Thank you for taking time to visit with me today. Please don't hesitate to contact me if I can be of assistance to you before our next scheduled appointment.  Our next appointment is no further scheduled appointments.  on 08/20/24 at 11am Please call the care guide team at [phone] if you need to cancel or reschedule your appointment.   Following is a copy of your care plan:   Goals Addressed             This Visit's Progress    BSW Goals       Current SDOH Barriers:  Limited access to food Lack of Dental and eye care Motorized chair is not working  Interventions: Patient interviewed and appropriate screenings performed Referred patient to community resources  Sent PCP a message for them to schedule Patient an assessment to get a new chair.         Please call the Suicide and Crisis Lifeline: 988 call the [place]  National Suicide Prevention Lifeline: 413-372-9592 or TTY: 4780860579 TTY 289-109-7950) to talk to a trained counselor call 1-800-273-TALK (toll free, 24 hour hotline) go to [facility] [address], [place] [id number]) call 911 if you are experiencing a Mental Health or Behavioral Health Crisis or need someone to talk to.  Patient verbalized understanding of Care plan and visit instructions communicated this visit  Orlean Fey, BSW [facility]  Value Based Care Institute Social Worker, [employer] [facility] [phone]

## 2024-07-25 NOTE — Patient Outreach (Signed)
 Social Drivers of Health  Community Resource and Care Coordination Visit Note   07/25/2024  Name: Craig [name] MRN: [id number] DOB:Jun 21, 1961  Situation: Referral received for [facility] needs assessment and assistance related to Food Insecurity  , Eye and Dental care, and DME equipment . I obtained verbal consent from Patient.  Visit completed with Patient on the phone.   Background:   BSW outreached patient to assist with SDOH barriers. During the call patient stated that he is currently on food stamps and is receiving grocery delivery services but sometimes food stamps will run out fully for the month. Patient also stated that he is in need of dental care so BSW will send patient a list of affordable resources. During the call patient also stated that his motorized chair is not working at this time and he has not had it more than a year and he can't afford to buy the replacement piece he needs for it. BSW will message PCP to see if they can have him assessed for a new chair. BSW will follow up on 1/7 .  Assessment:   Goals Addressed             This Visit's Progress    BSW Goals       Current SDOH Barriers:  Limited access to food Lack of Dental and eye care Motorized chair is not working  Interventions: Patient interviewed and appropriate screenings performed Referred patient to community resources  Sent PCP a message for them to schedule Patient an assessment to get a new chair.         Recommendation:   attend all scheduled provider appointments call for transportation assistance at least one week before appointments ask for help if you don't understand your health insurance benefits  Follow Up Plan:   Telephone follow-up on 08/20/24 at 11am  Orlean Fey, BSW [place]  Value Based Care Institute Social Worker, [employer] [facility] [phone]

## 2024-07-29 DIAGNOSIS — M25561 Pain in right knee: Secondary | ICD-10-CM | POA: Diagnosis not present

## 2024-07-31 ENCOUNTER — Ambulatory Visit (HOSPITAL_COMMUNITY)

## 2024-08-05 DIAGNOSIS — M25561 Pain in right knee: Secondary | ICD-10-CM | POA: Diagnosis not present

## 2024-08-15 ENCOUNTER — Other Ambulatory Visit: Payer: Self-pay

## 2024-08-15 DIAGNOSIS — G8929 Other chronic pain: Secondary | ICD-10-CM

## 2024-08-18 ENCOUNTER — Other Ambulatory Visit: Payer: Self-pay

## 2024-08-18 MED ORDER — PANTOPRAZOLE SODIUM 40 MG PO TBEC: 40.0000 mg | DELAYED_RELEASE_TABLET | Freq: Every day | ORAL | 3 refills | Status: AC

## 2024-08-20 ENCOUNTER — Other Ambulatory Visit: Payer: Self-pay

## 2024-08-20 NOTE — Patient Outreach (Signed)
 Social Drivers of Health  Community Resource and Care Coordination Visit Note   08/20/2024  Name: Craig [name] MRN: [id number] DOB:August 03, 1961  Situation: Referral received for [facility] needs assessment and assistance related to Food Insecurity  , Eye and Dental Care and Electric wheelchair. I obtained verbal consent from Patient.  Visit completed with Patient on the phone.   Background:      Assessment:   BSW outreached patient to follow up and confirm receipt of previously provided resources. Patient stated that he did receive the resources but has not yet had the opportunity to reach out to them. Patient reported that he plans to contact the resources within the next few days. Patient stated that everything is currently fine and that he will reach out if he needs any additional assistance. BSW inquired about the need for further follow-up, and patient declined at this time. BSW will close the case at this time.   Goals Addressed             This Visit's Progress    COMPLETED: BSW Goals       Current SDOH Barriers:  Limited access to food Lack of Dental and eye care Motorized chair is not working  Interventions: Patient interviewed and appropriate screenings performed Referred patient to community resources  Sent PCP a message for them to schedule Patient an assessment to get a new chair.         Recommendation:   attend all scheduled provider appointments call for transportation assistance at least one week before appointments ask for help if you don't understand your health insurance benefits  Follow Up Plan:   Patient declines further calls or assistance. [employer] will be closed. Patient has been provided contact information should new needs arise.   Orlean Fey, BSW [place]  Value Based Care Institute Social Worker, [employer] [facility] [phone]

## 2024-08-20 NOTE — Patient Instructions (Signed)

## 2024-08-20 NOTE — Progress Notes (Deleted)
" ° ° °  SUBJECTIVE:   CHIEF COMPLAINT / HPI:   Craig [name] is a 64 y.o. male presenting for follow-up of ***  Determine medical necessity for new motorized chair  Monitor CMP, CBC on meloxicam   Mounjaro  decreased from 15 weekly to 10 weekly on 03/2024 to help with GI issues Amlodipine -olmesartan  increased to 10-20mg  daily on 03/2024  Check PHQ9?  ***Echo ordered w cardiologist 06/2024 (has not gotten yet); prescribed NTG PRN, ordered lipid panel (LDL 86) -> to follow up 09/17/2024  Healthcare Maintenance: - Shingrix - Flu vaccine - Covid vaccine - Diabetic eye exam (scheduled for 08/2024?) - Colonoscopy  PERTINENT  PMH / PSH: HTN, T2DM, GERD, HLD, chronic knee pain, morbid obesity, anemia  OBJECTIVE:   There were no vitals taken for this visit.  ***  ASSESSMENT/PLAN:   Assessment & Plan      Alan Flies, MD [facility] "

## 2024-08-21 ENCOUNTER — Ambulatory Visit: Admitting: Emergency Medicine

## 2024-08-21 ENCOUNTER — Ambulatory Visit: Payer: Self-pay

## 2024-08-28 ENCOUNTER — Telehealth: Payer: Self-pay | Admitting: *Deleted

## 2024-09-03 NOTE — Progress Notes (Unsigned)
" ° ° °  SUBJECTIVE:   CHIEF COMPLAINT / HPI:   Craig [name] is a 64 y.o. male presenting for ***  Determine medical necessity for new motorized chair  Monitor CMP, CBC on meloxicam   Mounjaro  decreased from 15 weekly to 10 weekly on 03/2024 to help with GI issues Amlodipine -olmesartan  increased to 10-20mg  daily on 03/2024  Check PHQ9?  Echo ordered w cardiologist 06/2024 (has not gotten yet); prescribed NTG PRN, ordered lipid panel (LDL 86) -> to follow up 09/17/2024  Healthcare Maintenance: - Shingrix - Flu vaccine - Covid vaccine - Diabetic eye exam ***(scheduled for 08/2024?) - Colonoscopy  PERTINENT  PMH / PSH: HTN, GERD, T2DM, HLD, morbid obesity, anemia  OBJECTIVE:   There were no vitals taken for this visit.  ***  ASSESSMENT/PLAN:   Assessment & Plan      Alan Flies, MD [facility] "

## 2024-09-04 ENCOUNTER — Ambulatory Visit: Payer: Self-pay

## 2024-09-05 ENCOUNTER — Other Ambulatory Visit: Payer: Self-pay

## 2024-09-05 DIAGNOSIS — I10 Essential (primary) hypertension: Secondary | ICD-10-CM

## 2024-09-05 MED ORDER — METOPROLOL SUCCINATE ER 50 MG PO TB24: 50.0000 mg | ORAL_TABLET | Freq: Two times a day (BID) | ORAL | 2 refills | Status: DC

## 2024-09-05 MED ORDER — AMLODIPINE-OLMESARTAN 10-20 MG PO TABS: 1.0000 | ORAL_TABLET | Freq: Every day | ORAL | 2 refills | Status: AC

## 2024-09-05 NOTE — Addendum Note (Signed)
 Addended by: MARDELLE PASTOR on: 09/05/2024 12:25 PM   Modules accepted: Orders

## 2024-09-05 NOTE — Telephone Encounter (Signed)
 Patient called and stated that he is about out of all of his medication and asks to get them refilled please

## 2024-09-05 NOTE — Telephone Encounter (Signed)
 Called patient and ask which meds were needed, please send to pharmacy. Thanks! Cassell Mary CMA

## 2024-09-08 ENCOUNTER — Ambulatory Visit (HOSPITAL_COMMUNITY)

## 2024-09-16 ENCOUNTER — Telehealth: Payer: Self-pay | Admitting: Cardiology

## 2024-09-16 DIAGNOSIS — I10 Essential (primary) hypertension: Secondary | ICD-10-CM

## 2024-09-16 MED ORDER — METOPROLOL SUCCINATE ER 50 MG PO TB24: 50.0000 mg | ORAL_TABLET | Freq: Two times a day (BID) | ORAL | 2 refills | Status: AC

## 2024-09-16 NOTE — Telephone Encounter (Signed)
" °*  STAT* If patient is at the pharmacy, call can be transferred to refill team.   1. Which medications need to be refilled? (please list name of each medication and dose if known) metoprolol  succinate (TOPROL -XL) 50 MG 24 hr tablet    2. Would you like to learn more about the convenience, safety, & potential cost savings by using the [facility]?    3. Are you open to using the [facility] (Type [facility]. ).   4. Which pharmacy/location (including street and city if local pharmacy) is medication to be sent to? [id number] - [place],  - [address]    5. Do they need a 30 day or 90 day supply? 90  Pt states his PCP can't refill for him  "

## 2024-09-16 NOTE — Telephone Encounter (Signed)
 Pt aware that a refill was sent 09/05/24.  Pt thanked me for the call.

## 2024-09-17 ENCOUNTER — Ambulatory Visit: Admitting: Emergency Medicine

## 2024-09-19 ENCOUNTER — Telehealth: Payer: Self-pay | Admitting: *Deleted

## 2024-10-03 ENCOUNTER — Ambulatory Visit

## 2024-10-09 ENCOUNTER — Ambulatory Visit (HOSPITAL_COMMUNITY)

## 2024-10-16 ENCOUNTER — Telehealth: Payer: Self-pay | Admitting: *Deleted

## 2024-10-23 ENCOUNTER — Ambulatory Visit (HOSPITAL_COMMUNITY)

## 2024-11-11 ENCOUNTER — Ambulatory Visit: Admitting: Emergency Medicine
